# Patient Record
Sex: Female | Born: 1960
Health system: Southern US, Community
[De-identification: ages and names within clinical notes are randomized; demographics above are authoritative.]

## PROBLEM LIST (undated history)

## (undated) DIAGNOSIS — N952 Postmenopausal atrophic vaginitis: Secondary | ICD-10-CM

## (undated) DIAGNOSIS — M4306 Spondylolysis, lumbar region: Secondary | ICD-10-CM

## (undated) DIAGNOSIS — G500 Trigeminal neuralgia: Secondary | ICD-10-CM

## (undated) DIAGNOSIS — B009 Herpesviral infection, unspecified: Secondary | ICD-10-CM

## (undated) DIAGNOSIS — K912 Postsurgical malabsorption, not elsewhere classified: Secondary | ICD-10-CM

## (undated) DIAGNOSIS — I10 Essential (primary) hypertension: Secondary | ICD-10-CM

## (undated) DIAGNOSIS — J4520 Mild intermittent asthma, uncomplicated: Secondary | ICD-10-CM

## (undated) DIAGNOSIS — Z9989 Dependence on other enabling machines and devices: Secondary | ICD-10-CM

## (undated) DIAGNOSIS — Z8679 Personal history of other diseases of the circulatory system: Secondary | ICD-10-CM

## (undated) DIAGNOSIS — D509 Iron deficiency anemia, unspecified: Secondary | ICD-10-CM

## (undated) DIAGNOSIS — M5412 Radiculopathy, cervical region: Secondary | ICD-10-CM

## (undated) DIAGNOSIS — Z87410 Personal history of cervical dysplasia: Secondary | ICD-10-CM

## (undated) DIAGNOSIS — F329 Major depressive disorder, single episode, unspecified: Secondary | ICD-10-CM

## (undated) DIAGNOSIS — Z972 Presence of dental prosthetic device (complete) (partial): Secondary | ICD-10-CM

## (undated) DIAGNOSIS — Q6650 Congenital pes planus, unspecified foot: Secondary | ICD-10-CM

## (undated) DIAGNOSIS — Z8719 Personal history of other diseases of the digestive system: Secondary | ICD-10-CM

## (undated) DIAGNOSIS — Z9884 Bariatric surgery status: Secondary | ICD-10-CM

## (undated) DIAGNOSIS — J309 Allergic rhinitis, unspecified: Secondary | ICD-10-CM

## (undated) DIAGNOSIS — G4700 Insomnia, unspecified: Secondary | ICD-10-CM

## (undated) DIAGNOSIS — L811 Chloasma: Secondary | ICD-10-CM

## (undated) DIAGNOSIS — M79606 Pain in leg, unspecified: Secondary | ICD-10-CM

## (undated) DIAGNOSIS — H538 Other visual disturbances: Secondary | ICD-10-CM

## (undated) DIAGNOSIS — G43909 Migraine, unspecified, not intractable, without status migrainosus: Secondary | ICD-10-CM

## (undated) DIAGNOSIS — Z903 Acquired absence of stomach [part of]: Secondary | ICD-10-CM

## (undated) DIAGNOSIS — I872 Venous insufficiency (chronic) (peripheral): Secondary | ICD-10-CM

## (undated) DIAGNOSIS — M1990 Unspecified osteoarthritis, unspecified site: Secondary | ICD-10-CM

## (undated) DIAGNOSIS — G4733 Obstructive sleep apnea (adult) (pediatric): Secondary | ICD-10-CM

## (undated) DIAGNOSIS — Z973 Presence of spectacles and contact lenses: Secondary | ICD-10-CM

## (undated) DIAGNOSIS — M545 Low back pain: Secondary | ICD-10-CM

## (undated) DIAGNOSIS — N201 Calculus of ureter: Secondary | ICD-10-CM

## (undated) DIAGNOSIS — M12819 Other specific arthropathies, not elsewhere classified, unspecified shoulder: Secondary | ICD-10-CM

## (undated) DIAGNOSIS — R079 Chest pain, unspecified: Secondary | ICD-10-CM

## (undated) HISTORY — PX: ENDOMETRIAL ABLATION: SHX621

## (undated) HISTORY — DX: Essential (primary) hypertension: I10

## (undated) HISTORY — PX: LAPAROSCOPIC GASTRIC SLEEVE RESECTION: SHX5895

---
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
## 1990-12-29 HISTORY — PX: LAPAROSCOPIC CHOLECYSTECTOMY: SUR755

## 1998-11-08 ENCOUNTER — Encounter: Payer: Self-pay | Admitting: *Deleted

## 1998-11-08 ENCOUNTER — Emergency Department (HOSPITAL_COMMUNITY): Admission: EM | Admit: 1998-11-08 | Discharge: 1998-11-08 | Payer: Self-pay | Admitting: Emergency Medicine

## 1999-04-23 ENCOUNTER — Encounter: Admission: RE | Admit: 1999-04-23 | Discharge: 1999-04-23 | Payer: Self-pay | Admitting: Sports Medicine

## 1999-04-30 ENCOUNTER — Encounter: Admission: RE | Admit: 1999-04-30 | Discharge: 1999-04-30 | Payer: Self-pay | Admitting: Sports Medicine

## 1999-06-13 ENCOUNTER — Encounter: Admission: RE | Admit: 1999-06-13 | Discharge: 1999-06-13 | Payer: Self-pay | Admitting: Family Medicine

## 1999-06-21 ENCOUNTER — Encounter: Admission: RE | Admit: 1999-06-21 | Discharge: 1999-06-21 | Payer: Self-pay | Admitting: Family Medicine

## 1999-07-12 ENCOUNTER — Encounter: Admission: RE | Admit: 1999-07-12 | Discharge: 1999-07-12 | Payer: Self-pay | Admitting: Family Medicine

## 1999-09-09 ENCOUNTER — Encounter: Admission: RE | Admit: 1999-09-09 | Discharge: 1999-09-09 | Payer: Self-pay | Admitting: Family Medicine

## 1999-09-14 ENCOUNTER — Emergency Department (HOSPITAL_COMMUNITY): Admission: EM | Admit: 1999-09-14 | Discharge: 1999-09-14 | Payer: Self-pay | Admitting: Emergency Medicine

## 1999-09-27 ENCOUNTER — Encounter: Admission: RE | Admit: 1999-09-27 | Discharge: 1999-09-27 | Payer: Self-pay | Admitting: Family Medicine

## 1999-10-15 ENCOUNTER — Encounter: Admission: RE | Admit: 1999-10-15 | Discharge: 1999-10-15 | Payer: Self-pay | Admitting: Family Medicine

## 1999-10-23 ENCOUNTER — Encounter: Admission: RE | Admit: 1999-10-23 | Discharge: 1999-10-23 | Payer: Self-pay | Admitting: Family Medicine

## 1999-11-04 ENCOUNTER — Encounter: Admission: RE | Admit: 1999-11-04 | Discharge: 1999-11-04 | Payer: Self-pay | Admitting: Family Medicine

## 2000-04-21 HISTORY — PX: ANTERIOR CERVICAL DECOMP/DISCECTOMY FUSION: SHX1161

## 2000-08-26 ENCOUNTER — Encounter: Admission: RE | Admit: 2000-08-26 | Discharge: 2000-08-26 | Payer: Self-pay | Admitting: Family Medicine

## 2000-10-27 ENCOUNTER — Encounter: Admission: RE | Admit: 2000-10-27 | Discharge: 2000-10-27 | Payer: Self-pay | Admitting: Family Medicine

## 2000-11-02 ENCOUNTER — Encounter: Admission: RE | Admit: 2000-11-02 | Discharge: 2000-11-02 | Payer: Self-pay | Admitting: Family Medicine

## 2000-11-10 ENCOUNTER — Encounter: Admission: RE | Admit: 2000-11-10 | Discharge: 2001-02-08 | Payer: Self-pay | Admitting: *Deleted

## 2000-12-25 ENCOUNTER — Encounter: Admission: RE | Admit: 2000-12-25 | Discharge: 2000-12-25 | Payer: Self-pay | Admitting: Family Medicine

## 2001-02-24 ENCOUNTER — Encounter: Admission: RE | Admit: 2001-02-24 | Discharge: 2001-02-24 | Payer: Self-pay | Admitting: Family Medicine

## 2001-02-26 ENCOUNTER — Encounter: Admission: RE | Admit: 2001-02-26 | Discharge: 2001-02-26 | Payer: Self-pay | Admitting: Family Medicine

## 2001-03-25 ENCOUNTER — Encounter: Admission: RE | Admit: 2001-03-25 | Discharge: 2001-03-25 | Payer: Self-pay | Admitting: Family Medicine

## 2001-07-23 ENCOUNTER — Encounter: Admission: RE | Admit: 2001-07-23 | Discharge: 2001-07-23 | Payer: Self-pay | Admitting: Family Medicine

## 2001-10-11 ENCOUNTER — Emergency Department (HOSPITAL_COMMUNITY): Admission: EM | Admit: 2001-10-11 | Discharge: 2001-10-12 | Payer: Self-pay

## 2002-03-04 ENCOUNTER — Encounter: Admission: RE | Admit: 2002-03-04 | Discharge: 2002-03-04 | Payer: Self-pay | Admitting: Family Medicine

## 2002-03-16 ENCOUNTER — Encounter: Payer: Self-pay | Admitting: Emergency Medicine

## 2002-03-17 ENCOUNTER — Inpatient Hospital Stay (HOSPITAL_COMMUNITY): Admission: EM | Admit: 2002-03-17 | Discharge: 2002-03-20 | Payer: Self-pay | Admitting: *Deleted

## 2002-04-19 ENCOUNTER — Encounter: Admission: RE | Admit: 2002-04-19 | Discharge: 2002-04-19 | Payer: Self-pay | Admitting: Family Medicine

## 2002-05-03 ENCOUNTER — Emergency Department (HOSPITAL_COMMUNITY): Admission: EM | Admit: 2002-05-03 | Discharge: 2002-05-03 | Payer: Self-pay | Admitting: Emergency Medicine

## 2002-05-03 ENCOUNTER — Encounter: Payer: Self-pay | Admitting: Emergency Medicine

## 2002-05-19 ENCOUNTER — Encounter: Admission: RE | Admit: 2002-05-19 | Discharge: 2002-05-19 | Payer: Self-pay | Admitting: Family Medicine

## 2002-05-24 ENCOUNTER — Encounter: Admission: RE | Admit: 2002-05-24 | Discharge: 2002-05-24 | Payer: Self-pay | Admitting: Family Medicine

## 2002-05-26 ENCOUNTER — Encounter: Admission: RE | Admit: 2002-05-26 | Discharge: 2002-05-26 | Payer: Self-pay | Admitting: Family Medicine

## 2002-05-27 ENCOUNTER — Encounter: Admission: RE | Admit: 2002-05-27 | Discharge: 2002-08-25 | Payer: Self-pay | Admitting: Sports Medicine

## 2002-06-23 ENCOUNTER — Encounter: Admission: RE | Admit: 2002-06-23 | Discharge: 2002-06-23 | Payer: Self-pay | Admitting: Family Medicine

## 2002-07-27 ENCOUNTER — Encounter: Admission: RE | Admit: 2002-07-27 | Discharge: 2002-07-27 | Payer: Self-pay | Admitting: Family Medicine

## 2002-08-22 ENCOUNTER — Encounter: Admission: RE | Admit: 2002-08-22 | Discharge: 2002-08-22 | Payer: Self-pay | Admitting: Family Medicine

## 2002-11-03 ENCOUNTER — Encounter: Admission: RE | Admit: 2002-11-03 | Discharge: 2002-11-03 | Payer: Self-pay | Admitting: Family Medicine

## 2002-11-09 ENCOUNTER — Encounter: Payer: Self-pay | Admitting: Sports Medicine

## 2002-11-09 ENCOUNTER — Encounter: Admission: RE | Admit: 2002-11-09 | Discharge: 2002-11-09 | Payer: Self-pay | Admitting: Sports Medicine

## 2002-12-07 ENCOUNTER — Encounter: Admission: RE | Admit: 2002-12-07 | Discharge: 2002-12-07 | Payer: Self-pay | Admitting: Family Medicine

## 2002-12-19 ENCOUNTER — Encounter: Admission: RE | Admit: 2002-12-19 | Discharge: 2002-12-19 | Payer: Self-pay | Admitting: Family Medicine

## 2002-12-26 ENCOUNTER — Encounter: Payer: Self-pay | Admitting: *Deleted

## 2002-12-26 ENCOUNTER — Encounter: Admission: RE | Admit: 2002-12-26 | Discharge: 2002-12-26 | Payer: Self-pay | Admitting: *Deleted

## 2003-02-15 ENCOUNTER — Encounter: Admission: RE | Admit: 2003-02-15 | Discharge: 2003-02-15 | Payer: Self-pay | Admitting: Family Medicine

## 2003-02-15 ENCOUNTER — Ambulatory Visit (HOSPITAL_COMMUNITY): Admission: RE | Admit: 2003-02-15 | Discharge: 2003-02-15 | Payer: Self-pay | Admitting: Family Medicine

## 2003-02-20 ENCOUNTER — Encounter: Admission: RE | Admit: 2003-02-20 | Discharge: 2003-02-20 | Payer: Self-pay | Admitting: Family Medicine

## 2003-02-20 ENCOUNTER — Encounter: Payer: Self-pay | Admitting: Family Medicine

## 2003-02-23 ENCOUNTER — Encounter: Admission: RE | Admit: 2003-02-23 | Discharge: 2003-02-23 | Payer: Self-pay | Admitting: Family Medicine

## 2003-03-06 ENCOUNTER — Encounter: Admission: RE | Admit: 2003-03-06 | Discharge: 2003-03-06 | Payer: Self-pay | Admitting: Family Medicine

## 2003-03-06 ENCOUNTER — Ambulatory Visit (HOSPITAL_COMMUNITY): Admission: RE | Admit: 2003-03-06 | Discharge: 2003-03-06 | Payer: Self-pay | Admitting: Family Medicine

## 2003-04-24 ENCOUNTER — Encounter: Admission: RE | Admit: 2003-04-24 | Discharge: 2003-04-24 | Payer: Self-pay | Admitting: Family Medicine

## 2003-05-09 ENCOUNTER — Encounter: Admission: RE | Admit: 2003-05-09 | Discharge: 2003-05-09 | Payer: Self-pay | Admitting: Family Medicine

## 2003-06-21 ENCOUNTER — Emergency Department (HOSPITAL_COMMUNITY): Admission: EM | Admit: 2003-06-21 | Discharge: 2003-06-21 | Payer: Self-pay | Admitting: Emergency Medicine

## 2003-06-21 ENCOUNTER — Encounter: Payer: Self-pay | Admitting: Emergency Medicine

## 2003-07-31 ENCOUNTER — Encounter: Admission: RE | Admit: 2003-07-31 | Discharge: 2003-07-31 | Payer: Self-pay | Admitting: Family Medicine

## 2003-09-26 ENCOUNTER — Encounter: Admission: RE | Admit: 2003-09-26 | Discharge: 2003-09-26 | Payer: Self-pay | Admitting: Family Medicine

## 2003-09-28 ENCOUNTER — Encounter: Payer: Self-pay | Admitting: Sports Medicine

## 2003-09-28 ENCOUNTER — Encounter: Admission: RE | Admit: 2003-09-28 | Discharge: 2003-09-28 | Payer: Self-pay | Admitting: Sports Medicine

## 2003-10-11 ENCOUNTER — Emergency Department (HOSPITAL_COMMUNITY): Admission: EM | Admit: 2003-10-11 | Discharge: 2003-10-11 | Payer: Self-pay | Admitting: Emergency Medicine

## 2003-10-11 ENCOUNTER — Encounter: Payer: Self-pay | Admitting: Emergency Medicine

## 2003-11-15 ENCOUNTER — Encounter: Admission: RE | Admit: 2003-11-15 | Discharge: 2003-11-15 | Payer: Self-pay | Admitting: Family Medicine

## 2003-12-01 ENCOUNTER — Encounter: Admission: RE | Admit: 2003-12-01 | Discharge: 2003-12-01 | Payer: Self-pay | Admitting: Family Medicine

## 2004-01-19 ENCOUNTER — Encounter: Admission: RE | Admit: 2004-01-19 | Discharge: 2004-01-19 | Payer: Self-pay | Admitting: Family Medicine

## 2004-01-22 ENCOUNTER — Encounter: Admission: RE | Admit: 2004-01-22 | Discharge: 2004-01-22 | Payer: Self-pay | Admitting: Family Medicine

## 2004-03-07 ENCOUNTER — Emergency Department (HOSPITAL_COMMUNITY): Admission: EM | Admit: 2004-03-07 | Discharge: 2004-03-07 | Payer: Self-pay | Admitting: Emergency Medicine

## 2004-03-26 ENCOUNTER — Encounter: Admission: RE | Admit: 2004-03-26 | Discharge: 2004-03-26 | Payer: Self-pay | Admitting: *Deleted

## 2004-04-03 ENCOUNTER — Encounter: Admission: RE | Admit: 2004-04-03 | Discharge: 2004-07-02 | Payer: Self-pay | Admitting: *Deleted

## 2004-04-30 ENCOUNTER — Encounter: Admission: RE | Admit: 2004-04-30 | Discharge: 2004-04-30 | Payer: Self-pay | Admitting: Family Medicine

## 2004-08-13 ENCOUNTER — Encounter: Admission: RE | Admit: 2004-08-13 | Discharge: 2004-08-13 | Payer: Self-pay | Admitting: Family Medicine

## 2004-08-27 ENCOUNTER — Encounter: Admission: RE | Admit: 2004-08-27 | Discharge: 2004-08-27 | Payer: Self-pay | Admitting: Family Medicine

## 2004-09-29 ENCOUNTER — Emergency Department (HOSPITAL_COMMUNITY): Admission: EM | Admit: 2004-09-29 | Discharge: 2004-09-29 | Payer: Self-pay | Admitting: Emergency Medicine

## 2004-12-18 ENCOUNTER — Emergency Department (HOSPITAL_COMMUNITY): Admission: EM | Admit: 2004-12-18 | Discharge: 2004-12-19 | Payer: Self-pay | Admitting: Emergency Medicine

## 2004-12-27 ENCOUNTER — Ambulatory Visit: Payer: Self-pay | Admitting: Family Medicine

## 2005-01-17 ENCOUNTER — Ambulatory Visit: Payer: Self-pay | Admitting: Sports Medicine

## 2005-01-17 ENCOUNTER — Encounter (INDEPENDENT_AMBULATORY_CARE_PROVIDER_SITE_OTHER): Payer: Self-pay | Admitting: *Deleted

## 2005-01-20 ENCOUNTER — Ambulatory Visit: Payer: Self-pay | Admitting: Sports Medicine

## 2005-03-18 ENCOUNTER — Emergency Department (HOSPITAL_COMMUNITY): Admission: EM | Admit: 2005-03-18 | Discharge: 2005-03-19 | Payer: Self-pay | Admitting: Emergency Medicine

## 2005-03-19 ENCOUNTER — Emergency Department (HOSPITAL_COMMUNITY): Admission: EM | Admit: 2005-03-19 | Discharge: 2005-03-19 | Payer: Self-pay | Admitting: Emergency Medicine

## 2005-04-09 ENCOUNTER — Ambulatory Visit: Payer: Self-pay | Admitting: Family Medicine

## 2005-04-14 ENCOUNTER — Ambulatory Visit: Payer: Self-pay | Admitting: Family Medicine

## 2005-06-24 ENCOUNTER — Ambulatory Visit: Payer: Self-pay | Admitting: Family Medicine

## 2005-08-14 ENCOUNTER — Ambulatory Visit: Payer: Self-pay | Admitting: Family Medicine

## 2005-08-25 ENCOUNTER — Ambulatory Visit: Payer: Self-pay | Admitting: Family Medicine

## 2005-09-10 ENCOUNTER — Ambulatory Visit: Payer: Self-pay | Admitting: Family Medicine

## 2005-10-15 ENCOUNTER — Emergency Department (HOSPITAL_COMMUNITY): Admission: EM | Admit: 2005-10-15 | Discharge: 2005-10-15 | Payer: Self-pay | Admitting: Emergency Medicine

## 2005-10-31 ENCOUNTER — Ambulatory Visit: Payer: Self-pay | Admitting: Family Medicine

## 2005-12-09 ENCOUNTER — Ambulatory Visit: Payer: Self-pay | Admitting: Family Medicine

## 2006-01-14 ENCOUNTER — Ambulatory Visit: Payer: Self-pay | Admitting: Family Medicine

## 2006-01-30 ENCOUNTER — Ambulatory Visit: Payer: Self-pay | Admitting: Family Medicine

## 2006-02-02 ENCOUNTER — Ambulatory Visit: Payer: Self-pay | Admitting: Family Medicine

## 2006-03-18 ENCOUNTER — Ambulatory Visit: Payer: Self-pay | Admitting: Family Medicine

## 2006-04-28 ENCOUNTER — Encounter (INDEPENDENT_AMBULATORY_CARE_PROVIDER_SITE_OTHER): Payer: Self-pay | Admitting: *Deleted

## 2006-04-28 LAB — CONVERTED CEMR LAB

## 2006-05-08 ENCOUNTER — Ambulatory Visit: Payer: Self-pay | Admitting: Family Medicine

## 2006-05-15 ENCOUNTER — Ambulatory Visit: Payer: Self-pay | Admitting: Family Medicine

## 2006-06-04 ENCOUNTER — Encounter: Admission: RE | Admit: 2006-06-04 | Discharge: 2006-06-04 | Payer: Self-pay | Admitting: Sports Medicine

## 2006-06-12 ENCOUNTER — Ambulatory Visit: Payer: Self-pay | Admitting: Family Medicine

## 2006-07-16 ENCOUNTER — Encounter: Admission: RE | Admit: 2006-07-16 | Discharge: 2006-07-16 | Payer: Self-pay | Admitting: Sports Medicine

## 2006-09-18 ENCOUNTER — Ambulatory Visit: Payer: Self-pay | Admitting: Family Medicine

## 2006-10-05 ENCOUNTER — Ambulatory Visit: Payer: Self-pay | Admitting: Family Medicine

## 2006-10-27 ENCOUNTER — Ambulatory Visit: Payer: Self-pay | Admitting: Sports Medicine

## 2006-12-09 ENCOUNTER — Ambulatory Visit: Payer: Self-pay | Admitting: Family Medicine

## 2007-01-07 ENCOUNTER — Ambulatory Visit: Payer: Self-pay | Admitting: Family Medicine

## 2007-01-27 ENCOUNTER — Ambulatory Visit: Payer: Self-pay | Admitting: Family Medicine

## 2007-01-27 ENCOUNTER — Encounter (INDEPENDENT_AMBULATORY_CARE_PROVIDER_SITE_OTHER): Payer: Self-pay | Admitting: Family Medicine

## 2007-01-27 LAB — CONVERTED CEMR LAB
CO2: 25 meq/L (ref 19–32)
Calcium: 9.2 mg/dL (ref 8.4–10.5)
Chloride: 102 meq/L (ref 96–112)
Creatinine, Ser: 0.62 mg/dL (ref 0.40–1.20)
Glucose, Bld: 83 mg/dL (ref 70–99)
TSH: 1.352 microintl units/mL (ref 0.350–5.50)

## 2007-02-01 ENCOUNTER — Ambulatory Visit: Payer: Self-pay | Admitting: Family Medicine

## 2007-02-02 ENCOUNTER — Emergency Department (HOSPITAL_COMMUNITY): Admission: EM | Admit: 2007-02-02 | Discharge: 2007-02-02 | Payer: Self-pay | Admitting: Emergency Medicine

## 2007-02-05 ENCOUNTER — Ambulatory Visit: Payer: Self-pay | Admitting: Family Medicine

## 2007-02-16 ENCOUNTER — Ambulatory Visit: Payer: Self-pay | Admitting: Family Medicine

## 2007-02-17 DIAGNOSIS — M719 Bursopathy, unspecified: Secondary | ICD-10-CM

## 2007-02-17 DIAGNOSIS — M67919 Unspecified disorder of synovium and tendon, unspecified shoulder: Secondary | ICD-10-CM | POA: Insufficient documentation

## 2007-02-18 ENCOUNTER — Ambulatory Visit: Payer: Self-pay | Admitting: Family Medicine

## 2007-02-26 ENCOUNTER — Encounter (INDEPENDENT_AMBULATORY_CARE_PROVIDER_SITE_OTHER): Payer: Self-pay | Admitting: *Deleted

## 2007-03-03 ENCOUNTER — Telehealth: Payer: Self-pay | Admitting: *Deleted

## 2007-03-05 ENCOUNTER — Ambulatory Visit: Payer: Self-pay | Admitting: Sports Medicine

## 2007-03-25 ENCOUNTER — Encounter (INDEPENDENT_AMBULATORY_CARE_PROVIDER_SITE_OTHER): Payer: Self-pay | Admitting: Family Medicine

## 2007-03-25 ENCOUNTER — Ambulatory Visit (HOSPITAL_COMMUNITY): Admission: RE | Admit: 2007-03-25 | Discharge: 2007-03-25 | Payer: Self-pay | Admitting: Family Medicine

## 2007-03-25 ENCOUNTER — Ambulatory Visit: Payer: Self-pay | Admitting: Sports Medicine

## 2007-03-25 DIAGNOSIS — R079 Chest pain, unspecified: Secondary | ICD-10-CM

## 2007-03-25 HISTORY — DX: Chest pain, unspecified: R07.9

## 2007-03-26 ENCOUNTER — Encounter (INDEPENDENT_AMBULATORY_CARE_PROVIDER_SITE_OTHER): Payer: Self-pay | Admitting: Family Medicine

## 2007-03-26 LAB — CONVERTED CEMR LAB
AST: 12 units/L (ref 0–37)
Albumin: 3.5 g/dL (ref 3.5–5.2)
Calcium: 8.5 mg/dL (ref 8.4–10.5)
MCV: 92.2 fL (ref 78.0–100.0)
Pro B Natriuretic peptide (BNP): 24 pg/mL (ref 0.0–100.0)
Sodium: 139 meq/L (ref 135–145)
Total Bilirubin: 0.3 mg/dL (ref 0.3–1.2)
WBC: 8.3 10*3/uL (ref 4.0–10.5)

## 2007-03-29 ENCOUNTER — Encounter (INDEPENDENT_AMBULATORY_CARE_PROVIDER_SITE_OTHER): Payer: Self-pay | Admitting: Family Medicine

## 2007-04-06 ENCOUNTER — Encounter (INDEPENDENT_AMBULATORY_CARE_PROVIDER_SITE_OTHER): Payer: Self-pay | Admitting: Cardiology

## 2007-04-06 ENCOUNTER — Ambulatory Visit (HOSPITAL_COMMUNITY): Admission: RE | Admit: 2007-04-06 | Discharge: 2007-04-06 | Payer: Self-pay | Admitting: Family Medicine

## 2007-04-06 ENCOUNTER — Ambulatory Visit: Payer: Self-pay | Admitting: Cardiology

## 2007-04-06 HISTORY — PX: TRANSTHORACIC ECHOCARDIOGRAM: SHX275

## 2007-04-16 ENCOUNTER — Ambulatory Visit: Payer: Self-pay

## 2007-04-22 ENCOUNTER — Telehealth: Payer: Self-pay | Admitting: *Deleted

## 2007-04-23 ENCOUNTER — Ambulatory Visit: Payer: Self-pay

## 2007-04-30 ENCOUNTER — Ambulatory Visit: Payer: Self-pay | Admitting: Family Medicine

## 2007-04-30 DIAGNOSIS — N760 Acute vaginitis: Secondary | ICD-10-CM | POA: Insufficient documentation

## 2007-04-30 LAB — CONVERTED CEMR LAB: Whiff Test: POSITIVE

## 2007-05-04 ENCOUNTER — Telehealth (INDEPENDENT_AMBULATORY_CARE_PROVIDER_SITE_OTHER): Payer: Self-pay | Admitting: *Deleted

## 2007-05-04 ENCOUNTER — Ambulatory Visit (HOSPITAL_BASED_OUTPATIENT_CLINIC_OR_DEPARTMENT_OTHER): Admission: RE | Admit: 2007-05-04 | Discharge: 2007-05-04 | Payer: Self-pay | Admitting: Family Medicine

## 2007-05-08 ENCOUNTER — Ambulatory Visit: Payer: Self-pay | Admitting: Internal Medicine

## 2007-05-21 ENCOUNTER — Encounter (INDEPENDENT_AMBULATORY_CARE_PROVIDER_SITE_OTHER): Payer: Self-pay | Admitting: Family Medicine

## 2007-06-04 ENCOUNTER — Ambulatory Visit: Payer: Self-pay | Admitting: Family Medicine

## 2007-06-04 DIAGNOSIS — F32A Depression, unspecified: Secondary | ICD-10-CM | POA: Insufficient documentation

## 2007-06-04 DIAGNOSIS — F329 Major depressive disorder, single episode, unspecified: Secondary | ICD-10-CM

## 2007-06-07 ENCOUNTER — Telehealth: Payer: Self-pay | Admitting: *Deleted

## 2007-06-08 ENCOUNTER — Telehealth (INDEPENDENT_AMBULATORY_CARE_PROVIDER_SITE_OTHER): Payer: Self-pay | Admitting: Family Medicine

## 2007-06-09 ENCOUNTER — Telehealth: Payer: Self-pay | Admitting: *Deleted

## 2007-06-10 ENCOUNTER — Telehealth: Payer: Self-pay | Admitting: *Deleted

## 2007-06-18 ENCOUNTER — Ambulatory Visit: Payer: Self-pay | Admitting: Family Medicine

## 2007-08-23 ENCOUNTER — Ambulatory Visit: Payer: Self-pay | Admitting: Family Medicine

## 2007-08-23 ENCOUNTER — Telehealth (INDEPENDENT_AMBULATORY_CARE_PROVIDER_SITE_OTHER): Payer: Self-pay | Admitting: *Deleted

## 2007-08-23 DIAGNOSIS — G43909 Migraine, unspecified, not intractable, without status migrainosus: Secondary | ICD-10-CM | POA: Insufficient documentation

## 2007-08-23 HISTORY — DX: Migraine, unspecified, not intractable, without status migrainosus: G43.909

## 2007-09-14 ENCOUNTER — Telehealth: Payer: Self-pay | Admitting: *Deleted

## 2007-09-17 ENCOUNTER — Ambulatory Visit: Payer: Self-pay | Admitting: Family Medicine

## 2007-09-17 ENCOUNTER — Encounter (INDEPENDENT_AMBULATORY_CARE_PROVIDER_SITE_OTHER): Payer: Self-pay | Admitting: Family Medicine

## 2007-09-17 LAB — CONVERTED CEMR LAB
Beta hcg, urine, semiquantitative: NEGATIVE
Chlamydia, DNA Probe: NEGATIVE
Glucose, Urine, Semiquant: NEGATIVE
KOH Prep: NEGATIVE
Nitrite: NEGATIVE
Protein, U semiquant: NEGATIVE
Specific Gravity, Urine: 1.025
Urobilinogen, UA: 0.2
pH: 6

## 2007-09-18 ENCOUNTER — Encounter (INDEPENDENT_AMBULATORY_CARE_PROVIDER_SITE_OTHER): Payer: Self-pay | Admitting: Family Medicine

## 2007-09-22 ENCOUNTER — Encounter (INDEPENDENT_AMBULATORY_CARE_PROVIDER_SITE_OTHER): Payer: Self-pay | Admitting: Family Medicine

## 2007-09-22 ENCOUNTER — Encounter: Admission: RE | Admit: 2007-09-22 | Discharge: 2007-09-22 | Payer: Self-pay | Admitting: Family Medicine

## 2007-09-23 ENCOUNTER — Telehealth: Payer: Self-pay | Admitting: *Deleted

## 2007-10-05 ENCOUNTER — Telehealth (INDEPENDENT_AMBULATORY_CARE_PROVIDER_SITE_OTHER): Payer: Self-pay | Admitting: Family Medicine

## 2007-11-18 ENCOUNTER — Ambulatory Visit: Payer: Self-pay | Admitting: Family Medicine

## 2007-11-18 ENCOUNTER — Encounter (INDEPENDENT_AMBULATORY_CARE_PROVIDER_SITE_OTHER): Payer: Self-pay | Admitting: Family Medicine

## 2007-11-19 ENCOUNTER — Telehealth (INDEPENDENT_AMBULATORY_CARE_PROVIDER_SITE_OTHER): Payer: Self-pay | Admitting: Family Medicine

## 2007-11-24 ENCOUNTER — Ambulatory Visit: Payer: Self-pay | Admitting: Sports Medicine

## 2007-11-24 DIAGNOSIS — M12819 Other specific arthropathies, not elsewhere classified, unspecified shoulder: Secondary | ICD-10-CM

## 2007-11-24 DIAGNOSIS — M7520 Bicipital tendinitis, unspecified shoulder: Secondary | ICD-10-CM | POA: Insufficient documentation

## 2007-11-24 DIAGNOSIS — S43429A Sprain of unspecified rotator cuff capsule, initial encounter: Secondary | ICD-10-CM

## 2007-11-24 HISTORY — DX: Other specific arthropathies, not elsewhere classified, unspecified shoulder: M12.819

## 2008-01-10 ENCOUNTER — Ambulatory Visit: Payer: Self-pay | Admitting: Sports Medicine

## 2008-01-24 ENCOUNTER — Ambulatory Visit: Payer: Self-pay | Admitting: Family Medicine

## 2008-01-24 DIAGNOSIS — G4700 Insomnia, unspecified: Secondary | ICD-10-CM

## 2008-01-26 ENCOUNTER — Telehealth (INDEPENDENT_AMBULATORY_CARE_PROVIDER_SITE_OTHER): Payer: Self-pay | Admitting: Family Medicine

## 2008-01-31 ENCOUNTER — Encounter (INDEPENDENT_AMBULATORY_CARE_PROVIDER_SITE_OTHER): Payer: Self-pay | Admitting: Family Medicine

## 2008-02-04 ENCOUNTER — Telehealth (INDEPENDENT_AMBULATORY_CARE_PROVIDER_SITE_OTHER): Payer: Self-pay | Admitting: Family Medicine

## 2008-02-15 ENCOUNTER — Ambulatory Visit: Payer: Self-pay | Admitting: Family Medicine

## 2008-02-15 ENCOUNTER — Telehealth: Payer: Self-pay | Admitting: *Deleted

## 2008-02-23 ENCOUNTER — Ambulatory Visit: Payer: Self-pay | Admitting: Family Medicine

## 2008-02-25 ENCOUNTER — Ambulatory Visit: Payer: Self-pay | Admitting: Family Medicine

## 2008-03-03 ENCOUNTER — Ambulatory Visit: Payer: Self-pay | Admitting: Family Medicine

## 2008-03-03 DIAGNOSIS — M19079 Primary osteoarthritis, unspecified ankle and foot: Secondary | ICD-10-CM

## 2008-03-07 ENCOUNTER — Encounter: Admission: RE | Admit: 2008-03-07 | Discharge: 2008-03-07 | Payer: Self-pay | Admitting: Family Medicine

## 2008-03-08 ENCOUNTER — Telehealth (INDEPENDENT_AMBULATORY_CARE_PROVIDER_SITE_OTHER): Payer: Self-pay | Admitting: Family Medicine

## 2008-03-16 ENCOUNTER — Telehealth: Payer: Self-pay | Admitting: *Deleted

## 2008-03-17 ENCOUNTER — Ambulatory Visit: Payer: Self-pay | Admitting: Sports Medicine

## 2008-03-17 LAB — CONVERTED CEMR LAB
Saturation Ratios: 16 % — ABNORMAL LOW (ref 20–55)
TIBC: 261 ug/dL (ref 250–470)
UIBC: 220 ug/dL

## 2008-03-20 ENCOUNTER — Encounter (INDEPENDENT_AMBULATORY_CARE_PROVIDER_SITE_OTHER): Payer: Self-pay | Admitting: Family Medicine

## 2008-03-21 ENCOUNTER — Telehealth: Payer: Self-pay | Admitting: *Deleted

## 2008-03-24 ENCOUNTER — Ambulatory Visit: Payer: Self-pay | Admitting: Family Medicine

## 2008-03-24 DIAGNOSIS — G4733 Obstructive sleep apnea (adult) (pediatric): Secondary | ICD-10-CM | POA: Insufficient documentation

## 2008-03-26 ENCOUNTER — Emergency Department (HOSPITAL_COMMUNITY): Admission: EM | Admit: 2008-03-26 | Discharge: 2008-03-26 | Payer: Self-pay | Admitting: Emergency Medicine

## 2008-03-31 ENCOUNTER — Encounter (INDEPENDENT_AMBULATORY_CARE_PROVIDER_SITE_OTHER): Payer: Self-pay | Admitting: Family Medicine

## 2008-03-31 ENCOUNTER — Ambulatory Visit: Payer: Self-pay | Admitting: Family Medicine

## 2008-03-31 LAB — CONVERTED CEMR LAB
Alkaline Phosphatase: 89 units/L (ref 39–117)
CO2: 20 meq/L (ref 19–32)
Calcium: 8.7 mg/dL (ref 8.4–10.5)
Chloride: 103 meq/L (ref 96–112)
Cholesterol: 159 mg/dL (ref 0–200)
Glucose, Bld: 95 mg/dL (ref 70–99)
HCT: 37.2 % (ref 36.0–46.0)
Hgb A1c MFr Bld: 6 %
LDL Cholesterol: 94 mg/dL (ref 0–99)
Lead-Whole Blood: 2.1 ug/dL (ref <10.0)
MCHC: 33.3 g/dL (ref 30.0–36.0)
RBC: 4.03 M/uL (ref 3.87–5.11)
Total Bilirubin: 0.3 mg/dL (ref 0.3–1.2)
Total CHOL/HDL Ratio: 3
Triglycerides: 60 mg/dL (ref <150)
VLDL: 12 mg/dL (ref 0–40)

## 2008-04-02 ENCOUNTER — Encounter (INDEPENDENT_AMBULATORY_CARE_PROVIDER_SITE_OTHER): Payer: Self-pay | Admitting: Family Medicine

## 2008-05-12 ENCOUNTER — Encounter (INDEPENDENT_AMBULATORY_CARE_PROVIDER_SITE_OTHER): Payer: Self-pay | Admitting: Family Medicine

## 2008-05-12 ENCOUNTER — Other Ambulatory Visit: Admission: RE | Admit: 2008-05-12 | Discharge: 2008-05-12 | Payer: Self-pay | Admitting: Family Medicine

## 2008-05-12 ENCOUNTER — Ambulatory Visit: Payer: Self-pay | Admitting: Family Medicine

## 2008-05-12 DIAGNOSIS — N926 Irregular menstruation, unspecified: Secondary | ICD-10-CM | POA: Insufficient documentation

## 2008-05-12 DIAGNOSIS — N939 Abnormal uterine and vaginal bleeding, unspecified: Secondary | ICD-10-CM

## 2008-05-12 LAB — CONVERTED CEMR LAB
Chlamydia, DNA Probe: NEGATIVE
Pap Smear: NORMAL
Pap Smear: NORMAL
Pap Smear: NORMAL

## 2008-05-13 ENCOUNTER — Encounter (INDEPENDENT_AMBULATORY_CARE_PROVIDER_SITE_OTHER): Payer: Self-pay | Admitting: Family Medicine

## 2008-05-17 ENCOUNTER — Telehealth (INDEPENDENT_AMBULATORY_CARE_PROVIDER_SITE_OTHER): Payer: Self-pay | Admitting: Family Medicine

## 2008-06-05 ENCOUNTER — Telehealth (INDEPENDENT_AMBULATORY_CARE_PROVIDER_SITE_OTHER): Payer: Self-pay | Admitting: Family Medicine

## 2008-06-15 ENCOUNTER — Telehealth (INDEPENDENT_AMBULATORY_CARE_PROVIDER_SITE_OTHER): Payer: Self-pay | Admitting: *Deleted

## 2008-06-18 ENCOUNTER — Encounter (INDEPENDENT_AMBULATORY_CARE_PROVIDER_SITE_OTHER): Payer: Self-pay | Admitting: Emergency Medicine

## 2008-06-18 ENCOUNTER — Ambulatory Visit: Payer: Self-pay | Admitting: Vascular Surgery

## 2008-06-18 ENCOUNTER — Emergency Department (HOSPITAL_COMMUNITY): Admission: EM | Admit: 2008-06-18 | Discharge: 2008-06-18 | Payer: Self-pay | Admitting: Emergency Medicine

## 2008-06-20 ENCOUNTER — Telehealth (INDEPENDENT_AMBULATORY_CARE_PROVIDER_SITE_OTHER): Payer: Self-pay | Admitting: *Deleted

## 2008-06-23 ENCOUNTER — Encounter: Admission: RE | Admit: 2008-06-23 | Discharge: 2008-06-23 | Payer: Self-pay | Admitting: Family Medicine

## 2008-07-01 ENCOUNTER — Emergency Department (HOSPITAL_COMMUNITY): Admission: EM | Admit: 2008-07-01 | Discharge: 2008-07-01 | Payer: Self-pay | Admitting: Emergency Medicine

## 2008-07-07 ENCOUNTER — Ambulatory Visit: Payer: Self-pay | Admitting: Family Medicine

## 2008-07-07 DIAGNOSIS — I872 Venous insufficiency (chronic) (peripheral): Secondary | ICD-10-CM

## 2008-07-07 HISTORY — DX: Venous insufficiency (chronic) (peripheral): I87.2

## 2008-07-25 ENCOUNTER — Telehealth: Payer: Self-pay | Admitting: *Deleted

## 2008-07-25 ENCOUNTER — Ambulatory Visit: Payer: Self-pay | Admitting: Family Medicine

## 2008-07-26 ENCOUNTER — Emergency Department (HOSPITAL_COMMUNITY): Admission: EM | Admit: 2008-07-26 | Discharge: 2008-07-27 | Payer: Self-pay | Admitting: Emergency Medicine

## 2008-08-08 ENCOUNTER — Encounter: Payer: Self-pay | Admitting: Family Medicine

## 2008-09-18 ENCOUNTER — Encounter: Payer: Self-pay | Admitting: Family Medicine

## 2008-09-20 ENCOUNTER — Telehealth: Payer: Self-pay | Admitting: *Deleted

## 2008-09-27 ENCOUNTER — Ambulatory Visit: Payer: Self-pay | Admitting: Family Medicine

## 2008-09-27 DIAGNOSIS — I10 Essential (primary) hypertension: Secondary | ICD-10-CM

## 2008-09-27 DIAGNOSIS — M549 Dorsalgia, unspecified: Secondary | ICD-10-CM | POA: Insufficient documentation

## 2008-09-27 HISTORY — DX: Essential (primary) hypertension: I10

## 2008-09-27 LAB — CONVERTED CEMR LAB
Bilirubin Urine: NEGATIVE
Blood in Urine, dipstick: NEGATIVE
Glucose, Urine, Semiquant: NEGATIVE
Ketones, urine, test strip: NEGATIVE
Nitrite: NEGATIVE
Protein, U semiquant: NEGATIVE
Specific Gravity, Urine: 1.015
Urobilinogen, UA: 0.2
WBC Urine, dipstick: NEGATIVE
pH: 7

## 2008-09-29 ENCOUNTER — Emergency Department (HOSPITAL_COMMUNITY): Admission: EM | Admit: 2008-09-29 | Discharge: 2008-09-30 | Payer: Self-pay | Admitting: Emergency Medicine

## 2008-10-13 ENCOUNTER — Telehealth (INDEPENDENT_AMBULATORY_CARE_PROVIDER_SITE_OTHER): Payer: Self-pay | Admitting: *Deleted

## 2008-10-16 ENCOUNTER — Ambulatory Visit: Payer: Self-pay | Admitting: Family Medicine

## 2008-10-16 LAB — CONVERTED CEMR LAB
Bilirubin Urine: NEGATIVE
Blood in Urine, dipstick: NEGATIVE
Ketones, urine, test strip: NEGATIVE

## 2008-10-26 ENCOUNTER — Encounter: Payer: Self-pay | Admitting: *Deleted

## 2008-10-26 ENCOUNTER — Emergency Department (HOSPITAL_COMMUNITY): Admission: EM | Admit: 2008-10-26 | Discharge: 2008-10-26 | Payer: Self-pay | Admitting: Emergency Medicine

## 2008-10-27 ENCOUNTER — Encounter: Payer: Self-pay | Admitting: *Deleted

## 2008-12-09 ENCOUNTER — Ambulatory Visit: Payer: Self-pay | Admitting: Vascular Surgery

## 2008-12-09 ENCOUNTER — Encounter (INDEPENDENT_AMBULATORY_CARE_PROVIDER_SITE_OTHER): Payer: Self-pay | Admitting: Emergency Medicine

## 2008-12-09 ENCOUNTER — Emergency Department (HOSPITAL_COMMUNITY): Admission: EM | Admit: 2008-12-09 | Discharge: 2008-12-09 | Payer: Self-pay | Admitting: Emergency Medicine

## 2008-12-26 ENCOUNTER — Telehealth: Payer: Self-pay | Admitting: *Deleted

## 2008-12-27 ENCOUNTER — Ambulatory Visit: Payer: Self-pay | Admitting: Family Medicine

## 2009-01-31 ENCOUNTER — Emergency Department (HOSPITAL_COMMUNITY): Admission: EM | Admit: 2009-01-31 | Discharge: 2009-01-31 | Payer: Self-pay | Admitting: Emergency Medicine

## 2009-01-31 ENCOUNTER — Telehealth: Payer: Self-pay | Admitting: Family Medicine

## 2009-02-01 ENCOUNTER — Encounter (INDEPENDENT_AMBULATORY_CARE_PROVIDER_SITE_OTHER): Payer: Self-pay | Admitting: Emergency Medicine

## 2009-02-01 ENCOUNTER — Ambulatory Visit (HOSPITAL_COMMUNITY): Admission: RE | Admit: 2009-02-01 | Discharge: 2009-02-01 | Payer: Self-pay | Admitting: Emergency Medicine

## 2009-02-01 ENCOUNTER — Ambulatory Visit: Payer: Self-pay | Admitting: Surgery

## 2009-02-06 ENCOUNTER — Ambulatory Visit: Payer: Self-pay | Admitting: Family Medicine

## 2009-02-06 DIAGNOSIS — R609 Edema, unspecified: Secondary | ICD-10-CM

## 2009-02-06 DIAGNOSIS — M23303 Other meniscus derangements, unspecified medial meniscus, right knee: Secondary | ICD-10-CM

## 2009-02-12 ENCOUNTER — Encounter: Admission: RE | Admit: 2009-02-12 | Discharge: 2009-02-12 | Payer: Self-pay | Admitting: Family Medicine

## 2009-02-16 ENCOUNTER — Ambulatory Visit: Payer: Self-pay | Admitting: Family Medicine

## 2009-02-19 ENCOUNTER — Ambulatory Visit: Payer: Self-pay | Admitting: Family Medicine

## 2009-03-19 ENCOUNTER — Emergency Department (HOSPITAL_COMMUNITY): Admission: EM | Admit: 2009-03-19 | Discharge: 2009-03-19 | Payer: Self-pay | Admitting: Emergency Medicine

## 2009-03-19 ENCOUNTER — Telehealth: Payer: Self-pay | Admitting: *Deleted

## 2009-03-27 ENCOUNTER — Ambulatory Visit: Payer: Self-pay | Admitting: Family Medicine

## 2009-03-27 LAB — CONVERTED CEMR LAB
Chlamydia, DNA Probe: NEGATIVE
GC Probe Amp, Genital: NEGATIVE
Whiff Test: NEGATIVE

## 2009-03-28 ENCOUNTER — Encounter (INDEPENDENT_AMBULATORY_CARE_PROVIDER_SITE_OTHER): Payer: Self-pay | Admitting: *Deleted

## 2009-04-02 ENCOUNTER — Encounter: Payer: Self-pay | Admitting: Family Medicine

## 2009-04-02 ENCOUNTER — Telehealth: Payer: Self-pay | Admitting: *Deleted

## 2009-05-25 ENCOUNTER — Telehealth: Payer: Self-pay | Admitting: Family Medicine

## 2009-05-29 ENCOUNTER — Emergency Department (HOSPITAL_COMMUNITY): Admission: EM | Admit: 2009-05-29 | Discharge: 2009-05-29 | Payer: Self-pay | Admitting: Emergency Medicine

## 2009-05-31 ENCOUNTER — Telehealth: Payer: Self-pay | Admitting: *Deleted

## 2009-06-05 ENCOUNTER — Encounter: Payer: Self-pay | Admitting: Family Medicine

## 2009-06-25 ENCOUNTER — Encounter: Admission: RE | Admit: 2009-06-25 | Discharge: 2009-06-25 | Payer: Self-pay | Admitting: Family Medicine

## 2009-07-03 ENCOUNTER — Encounter: Payer: Self-pay | Admitting: Family Medicine

## 2009-07-24 ENCOUNTER — Ambulatory Visit: Payer: Self-pay | Admitting: Family Medicine

## 2009-07-24 ENCOUNTER — Encounter: Payer: Self-pay | Admitting: Family Medicine

## 2009-07-24 DIAGNOSIS — L659 Nonscarring hair loss, unspecified: Secondary | ICD-10-CM | POA: Insufficient documentation

## 2009-07-24 DIAGNOSIS — F070 Personality change due to known physiological condition: Secondary | ICD-10-CM

## 2009-07-24 LAB — CONVERTED CEMR LAB
BUN: 11 mg/dL (ref 6–23)
CO2: 21 meq/L (ref 19–32)
Calcium: 8.7 mg/dL (ref 8.4–10.5)
Chloride: 106 meq/L (ref 96–112)
Creatinine, Ser: 0.68 mg/dL (ref 0.40–1.20)
Glucose, Bld: 94 mg/dL (ref 70–99)
Potassium: 3.6 meq/L (ref 3.5–5.3)
Sodium: 139 meq/L (ref 135–145)
TSH: 1.504 microintl units/mL (ref 0.350–4.500)

## 2009-07-25 ENCOUNTER — Encounter: Payer: Self-pay | Admitting: Family Medicine

## 2009-08-02 ENCOUNTER — Encounter: Payer: Self-pay | Admitting: Family Medicine

## 2009-08-02 ENCOUNTER — Encounter: Admission: RE | Admit: 2009-08-02 | Discharge: 2009-10-31 | Payer: Self-pay | Admitting: Family Medicine

## 2009-09-27 ENCOUNTER — Telehealth: Payer: Self-pay | Admitting: Family Medicine

## 2009-10-10 ENCOUNTER — Emergency Department (HOSPITAL_COMMUNITY): Admission: EM | Admit: 2009-10-10 | Discharge: 2009-10-11 | Payer: Self-pay | Admitting: Emergency Medicine

## 2009-10-15 ENCOUNTER — Ambulatory Visit: Payer: Self-pay | Admitting: Family Medicine

## 2009-10-15 ENCOUNTER — Encounter: Payer: Self-pay | Admitting: Family Medicine

## 2009-10-15 DIAGNOSIS — M545 Low back pain: Secondary | ICD-10-CM

## 2009-10-15 LAB — CONVERTED CEMR LAB
Hemoglobin: 13.2 g/dL (ref 12.0–15.0)
MCHC: 32.1 g/dL (ref 30.0–36.0)
RBC: 4.31 M/uL (ref 3.87–5.11)

## 2009-10-17 ENCOUNTER — Encounter: Payer: Self-pay | Admitting: *Deleted

## 2009-10-18 ENCOUNTER — Encounter: Admission: RE | Admit: 2009-10-18 | Discharge: 2009-10-18 | Payer: Self-pay | Admitting: Family Medicine

## 2009-10-19 ENCOUNTER — Telehealth: Payer: Self-pay | Admitting: Family Medicine

## 2009-10-29 ENCOUNTER — Ambulatory Visit: Payer: Self-pay | Admitting: Family Medicine

## 2009-10-29 ENCOUNTER — Encounter: Payer: Self-pay | Admitting: Family Medicine

## 2009-11-16 ENCOUNTER — Emergency Department (HOSPITAL_COMMUNITY): Admission: EM | Admit: 2009-11-16 | Discharge: 2009-11-16 | Payer: Self-pay | Admitting: Emergency Medicine

## 2009-11-27 ENCOUNTER — Telehealth: Payer: Self-pay | Admitting: Family Medicine

## 2009-12-06 ENCOUNTER — Ambulatory Visit: Payer: Self-pay | Admitting: Family Medicine

## 2009-12-06 LAB — CONVERTED CEMR LAB
Chlamydia, Swab/Urine, PCR: NEGATIVE
GC Probe Amp, Urine: NEGATIVE

## 2009-12-18 ENCOUNTER — Telehealth: Payer: Self-pay | Admitting: Family Medicine

## 2009-12-19 ENCOUNTER — Telehealth: Payer: Self-pay | Admitting: Family Medicine

## 2009-12-20 ENCOUNTER — Encounter: Payer: Self-pay | Admitting: Family Medicine

## 2009-12-20 ENCOUNTER — Ambulatory Visit: Payer: Self-pay | Admitting: Family Medicine

## 2009-12-20 DIAGNOSIS — N898 Other specified noninflammatory disorders of vagina: Secondary | ICD-10-CM | POA: Insufficient documentation

## 2009-12-20 LAB — CONVERTED CEMR LAB
Beta hcg, urine, semiquantitative: NEGATIVE
Chlamydia, DNA Probe: NEGATIVE
GC Probe Amp, Genital: NEGATIVE
Whiff Test: NEGATIVE

## 2009-12-23 ENCOUNTER — Encounter: Payer: Self-pay | Admitting: Family Medicine

## 2010-01-07 ENCOUNTER — Telehealth: Payer: Self-pay | Admitting: *Deleted

## 2010-02-06 ENCOUNTER — Ambulatory Visit: Payer: Self-pay | Admitting: Family Medicine

## 2010-02-08 ENCOUNTER — Ambulatory Visit: Payer: Self-pay | Admitting: Family Medicine

## 2010-03-05 ENCOUNTER — Emergency Department (HOSPITAL_COMMUNITY): Admission: EM | Admit: 2010-03-05 | Discharge: 2010-03-06 | Payer: Self-pay | Admitting: Emergency Medicine

## 2010-03-06 ENCOUNTER — Ambulatory Visit (HOSPITAL_COMMUNITY): Admission: RE | Admit: 2010-03-06 | Discharge: 2010-03-06 | Payer: Self-pay | Admitting: Emergency Medicine

## 2010-03-06 ENCOUNTER — Ambulatory Visit: Payer: Self-pay | Admitting: Vascular Surgery

## 2010-03-06 ENCOUNTER — Encounter (INDEPENDENT_AMBULATORY_CARE_PROVIDER_SITE_OTHER): Payer: Self-pay | Admitting: Emergency Medicine

## 2010-03-11 ENCOUNTER — Telehealth (INDEPENDENT_AMBULATORY_CARE_PROVIDER_SITE_OTHER): Payer: Self-pay | Admitting: *Deleted

## 2010-03-21 ENCOUNTER — Ambulatory Visit: Payer: Self-pay | Admitting: Family Medicine

## 2010-04-01 ENCOUNTER — Telehealth: Payer: Self-pay | Admitting: Family Medicine

## 2010-04-09 ENCOUNTER — Ambulatory Visit: Payer: Self-pay | Admitting: Family Medicine

## 2010-04-09 LAB — CONVERTED CEMR LAB: TSH: 0.986 microintl units/mL (ref 0.350–4.500)

## 2010-04-10 ENCOUNTER — Encounter: Payer: Self-pay | Admitting: Family Medicine

## 2010-06-12 ENCOUNTER — Ambulatory Visit: Payer: Self-pay | Admitting: Family Medicine

## 2010-06-12 DIAGNOSIS — M7710 Lateral epicondylitis, unspecified elbow: Secondary | ICD-10-CM | POA: Insufficient documentation

## 2010-06-12 LAB — CONVERTED CEMR LAB
Chlamydia, Swab/Urine, PCR: NEGATIVE
GC Probe Amp, Urine: NEGATIVE

## 2010-06-13 ENCOUNTER — Telehealth: Payer: Self-pay | Admitting: *Deleted

## 2010-06-13 LAB — CONVERTED CEMR LAB
CO2: 19 meq/L (ref 19–32)
Chloride: 108 meq/L (ref 96–112)
Sodium: 136 meq/L (ref 135–145)

## 2010-06-14 ENCOUNTER — Telehealth (INDEPENDENT_AMBULATORY_CARE_PROVIDER_SITE_OTHER): Payer: Self-pay | Admitting: *Deleted

## 2010-06-21 ENCOUNTER — Ambulatory Visit: Payer: Self-pay | Admitting: Family Medicine

## 2010-06-27 ENCOUNTER — Encounter: Admission: RE | Admit: 2010-06-27 | Discharge: 2010-06-27 | Payer: Self-pay | Admitting: Family Medicine

## 2010-07-08 ENCOUNTER — Encounter: Payer: Self-pay | Admitting: Family Medicine

## 2010-08-27 ENCOUNTER — Emergency Department (HOSPITAL_COMMUNITY): Admission: EM | Admit: 2010-08-27 | Discharge: 2010-08-27 | Payer: Self-pay | Admitting: Family Medicine

## 2010-08-30 ENCOUNTER — Ambulatory Visit: Payer: Self-pay | Admitting: Family Medicine

## 2010-08-30 DIAGNOSIS — T8339XA Other mechanical complication of intrauterine contraceptive device, initial encounter: Secondary | ICD-10-CM

## 2010-09-17 ENCOUNTER — Ambulatory Visit: Payer: Self-pay | Admitting: Family Medicine

## 2010-09-27 ENCOUNTER — Ambulatory Visit (HOSPITAL_COMMUNITY): Admission: RE | Admit: 2010-09-27 | Discharge: 2010-09-27 | Payer: Self-pay | Admitting: Sports Medicine

## 2010-09-27 ENCOUNTER — Ambulatory Visit: Payer: Self-pay | Admitting: Family Medicine

## 2010-10-09 ENCOUNTER — Telehealth: Payer: Self-pay | Admitting: *Deleted

## 2010-10-11 ENCOUNTER — Encounter: Payer: Self-pay | Admitting: Family Medicine

## 2010-10-11 ENCOUNTER — Ambulatory Visit: Payer: Self-pay | Admitting: Family Medicine

## 2010-10-17 ENCOUNTER — Other Ambulatory Visit: Admission: RE | Admit: 2010-10-17 | Discharge: 2010-10-17 | Payer: Self-pay | Admitting: Obstetrics and Gynecology

## 2010-10-17 ENCOUNTER — Ambulatory Visit: Payer: Self-pay | Admitting: Obstetrics and Gynecology

## 2010-10-31 ENCOUNTER — Telehealth: Payer: Self-pay | Admitting: Sports Medicine

## 2010-11-20 ENCOUNTER — Encounter: Payer: Self-pay | Admitting: Sports Medicine

## 2010-11-28 ENCOUNTER — Encounter (INDEPENDENT_AMBULATORY_CARE_PROVIDER_SITE_OTHER): Payer: Self-pay | Admitting: *Deleted

## 2011-01-09 ENCOUNTER — Encounter: Payer: Self-pay | Admitting: Family Medicine

## 2011-01-09 ENCOUNTER — Ambulatory Visit: Admission: RE | Admit: 2011-01-09 | Discharge: 2011-01-09 | Payer: Self-pay | Source: Home / Self Care

## 2011-01-13 ENCOUNTER — Encounter: Payer: Self-pay | Admitting: Obstetrics and Gynecology

## 2011-01-13 ENCOUNTER — Ambulatory Visit: Admission: RE | Admit: 2011-01-13 | Discharge: 2011-01-13 | Payer: Self-pay | Source: Home / Self Care

## 2011-01-13 ENCOUNTER — Ambulatory Visit
Admission: RE | Admit: 2011-01-13 | Discharge: 2011-01-13 | Payer: Self-pay | Source: Home / Self Care | Attending: Obstetrics and Gynecology | Admitting: Obstetrics and Gynecology

## 2011-01-13 ENCOUNTER — Encounter: Payer: Self-pay | Admitting: Sports Medicine

## 2011-01-13 ENCOUNTER — Encounter: Payer: Self-pay | Admitting: Family Medicine

## 2011-01-13 LAB — CONVERTED CEMR LAB
Chloride: 102 meq/L (ref 96–112)
Hemoglobin: 13.4 g/dL (ref 12.0–15.0)
MCHC: 33.6 g/dL (ref 30.0–36.0)
MCHC: 33.2 g/dL (ref 30.0–36.0)
MCV: 92.6 fL (ref 78.0–100.0)
MCV: 92.5 fL (ref 78.0–100.0)
Platelets: 334 10*3/uL (ref 150–400)
Potassium: 4 meq/L (ref 3.5–5.3)
RBC: 4.31 M/uL (ref 3.87–5.11)
RBC: 4.14 M/uL (ref 3.87–5.11)
Sed Rate: 63 mm/hr — ABNORMAL HIGH (ref 0–22)

## 2011-01-14 ENCOUNTER — Encounter: Payer: Self-pay | Admitting: Obstetrics and Gynecology

## 2011-01-14 LAB — CONVERTED CEMR LAB: Trich, Wet Prep: NONE SEEN

## 2011-01-24 ENCOUNTER — Ambulatory Visit: Admit: 2011-01-24 | Payer: Self-pay

## 2011-01-28 NOTE — Progress Notes (Signed)
 ----   Converted from flag ---- ---- 06/13/2010 3:29 PM, Sarah Swaziland MD wrote: Ms. Jessica [name] had hep B #2 done 02/06/10, and Hep A #1 done 12/06/09.  She is interested in completing the course.  Could set her up for those?  Thanks! SJ ------------------------------ called and left message on voicemail that she can call for appointment and we can complete her series any time.  Theresia Lo RN  June 13, 2010 4:50 PM

## 2011-01-28 NOTE — Progress Notes (Signed)
Summary: phn msg   Phone Note Call from Patient Call back at [phone]   Caller: Patient Summary of Call: Pt checking on results of x-rays she had on her foot.  Also wanted to let Dr. Swaziland know her foot was causing her a lot of pain. Initial call taken by: Clydell Hakim,  October 09, 2010 1:32 PM  Follow-up for Phone Call        pt is calling again and is in pain with foot and can't put a shoe on. 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  I will ask the sports medicine folks if they would be able to help her, or if she should to to see and orthopedic doctor, but it will probalby be Monday before I can get an answer about this.  Is she taking the pain medicines? Additional Follow-up by: Sarah Swaziland MD,  October 10, 2010 9:31 PM    Additional Follow-up for Phone Call Additional follow up Details #2::    LVM for patient to call back Follow-up by: Jimmy Footman, CMA,  October 11, 2010 11:26 AM  Additional Follow-up for Phone Call Additional follow up Details #3:: Details for Additional Follow-up Action Taken: spoke with patient and informed her of her x rays. she sates that the pain meds are not helping. She has an appt today in which she will discuss that at the visit. Additional Follow-up by: Jimmy Footman, CMA,  October 11, 2010 1:48 PM

## 2011-01-28 NOTE — Assessment & Plan Note (Signed)
Summary: FU/KH   Vital Signs:  Patient profile:   50 year old female Height:      60 inches Weight:      276.3 pounds BMI:     54.16 Temp:     98.3 degrees F oral Pulse rate:   76 / minute BP sitting:   144 / 85  (left arm) Cuff size:   large  Vitals Entered By: Gladstone Pih (April 09, 2010 9:50 AM) CC: F/U Is Patient Diabetic? No Pain Assessment Patient in pain? no        Primary Care Provider:  Asees Manfredi Swaziland MD  CC:  F/U.  History of Present Illness: Pt feels stressed and depressed about weight.  Gaining, not losing.  Mood a little better.  Taking meds, wants to continue for a month at least. Drinking water, steaming veggies, not much walking because of back pain, leg swelling and pain.  NO candy.  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 Stoking helps, but it's really tight.  Got largest one available. Not wearing currently becasue she is wearing flipflops.   Went to [employer] and filled out paperwork for scholarship.  Tried jump rope.  Thought about DVDs, WII.   Concerned about hair loss.  Could it be from meds.  Can see scalp, which is new for her.  Does dye hair, pulls back.  Habits & Providers  Alcohol-Tobacco-Diet     Tobacco Status: never  Current Medications (verified): 1)  Topamax 25 Mg Tabs (Topiramate) .... Take One By Mouth Bid 2)  Imitrex 25 Mg  Tabs (Sumatriptan Succinate) .... Take One Pill At The Onset of Migraine, May Repeat X1 in 2 Hrs If Not Better 3)  Lisinopril 20 Mg Tabs (Lisinopril) .... Take One By Mouth Daily 4)  Ambien 5 Mg  Tabs (Zolpidem Tartrate) .Marland Kitchen.. 1 Tab By Mouth At Bedtime As Needed Sleep 5)  Valtrex 500 Mg  Tabs (Valacyclovir Hcl) .Marland Kitchen.. 1 Tab By Mouth Daily 6)  Ted Hose .... Put On in Am and Take Off in Pm Diagnosis:venous Insufficiency and Le Edema 7)  Flexeril 10 Mg  Tabs (Cyclobenzaprine Hcl) .Marland Kitchen.. 1 By Mouth Bid As Needed Back Pain 8)  Colace 100 Mg Caps (Docusate Sodium) .... Take One By Mouth Bid 9)  Miralax  Powd (Polyethylene Glycol 3350) .Marland Kitchen.. 17g in  8 Oz Water Dissolved Daily X 2 Wks 10)  Wellbutrin Xl 150 Mg Xr24h-Tab (Bupropion Hcl) .... Take 1 Pill  Po Every Morning For 4 Days, Then Increase To 2 Pills Every Day.  Allergies: No Known Drug Allergies  Review of Systems       see HPI  Physical Exam  General:  Obese,in no acute distress; alert,appropriate and cooperative throughout examination.  Vitals noted Head:  Thinning of hair throughout.  No breaking of hair at scalp.  Psych:  Nl grooming and dress.  No FOI/LOA.  Non-labile   Impression & Recommendations:  Problem # 1:  MORBID OBESITY (ICD-278.01) Pt declines appt with nutrition.  Will try to increase exercise. [facility]   Orders: TSH-[facility] ([id number]) [facility]- Est Level  3 (43154)  Problem # 2:  UNSPECIFIED ALOPECIA (ICD-704.00) Check TSH.  Unclear etiology  Problem # 3:  DEPRESSIVE DISORDER, RCR, MODERATE (ICD-296.32)  Seems to be slightly improved.  Pt feels her weight is causing her depression.  See #1.  Orders: [facility]- Est Level  3 (00867)  Complete Medication List: 1)  Topamax 25 Mg Tabs (Topiramate) .... Take one by  mouth bid 2)  Imitrex 25 Mg Tabs (Sumatriptan succinate) .... Take one pill at the onset of migraine, may repeat x1 in 2 hrs if not better 3)  Lisinopril 20 Mg Tabs (Lisinopril) .... Take one by mouth daily 4)  Ambien 5 Mg Tabs (Zolpidem tartrate) .Marland Kitchen.. 1 tab by mouth at bedtime as needed sleep 5)  Valtrex 500 Mg Tabs (Valacyclovir hcl) .Marland Kitchen.. 1 tab by mouth daily 6)  [facility]  .... Put on in am and take off in pm diagnosis:venous insufficiency and le edema 7)  Flexeril 10 Mg Tabs (Cyclobenzaprine hcl) .Marland Kitchen.. 1 by mouth bid as needed back pain 8)  Colace 100 Mg Caps (Docusate sodium) .... Take one by mouth bid 9)  Miralax Powd (Polyethylene glycol 3350) .Marland Kitchen.. 17g in 8 oz water dissolved daily x 2 wks 10)  Wellbutrin Xl 150 Mg Xr24h-tab (Bupropion hcl) .... Take 1 pill  po every morning for 4 days, then increase to 2 pills  every day.  Patient Instructions: 1)  We will check your thyroid today. 2)  Continue with the exercise.  Keep a food diary. 3)  I will review your meds and see if any could be causing the hair loss. 4)  Please see [place] again in 1 month.  Prescriptions: FLEXERIL 10 MG  TABS (CYCLOBENZAPRINE HCL) 1 by mouth bid as needed back pain  #60 x 0   Entered and Authorized by:   Matricia Begnaud Swaziland MD   Signed by:   Kienna Moncada Swaziland MD on 04/09/2010   Method used:   [facility]        [facility] [address] [phone][facility] (retail)       [address]       [place], [place]  [address]       Ph: [phone] or [phone]       Fax: [phone]   RxID:   [id number]

## 2011-01-28 NOTE — Miscellaneous (Signed)
  Clinical Lists Changes Medical Records Request Received medical records request Records went to [facility] Faxed on 10-30-2009 Kathi Simpers [facility])  November 28, 2010 9:41 AM

## 2011-01-28 NOTE — Letter (Signed)
Summary: Results Letter  [facility]  [address]   [place], [place] [address]   Phone: [phone]  Fax: [phone]    12/23/2009  KLYN KROENING [address] [place], [place]  [address]  Dear Ms. Jessica [name],  I am happy to inform you that the results of your recent labs, including gonorrhea, chlamydia, HIV, and syphilis tests, were all negative.  Sincerely,   Helane Rima DO  Appended Document: Results Letter mailed.

## 2011-01-28 NOTE — Assessment & Plan Note (Signed)
Summary: read ppd/kh   Nurse Visit   Allergies: No Known Drug Allergies  PPD Results    Date of reading: 02/08/2010    Results: < 5mm    Interpretation: negative  Orders Added: 1)  No Charge Patient Arrived (NCPA0) [NCPA0]
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
## 2011-01-28 NOTE — Assessment & Plan Note (Signed)
Summary: foot pain/Jessica [name]/Jessica [name]   Vital Signs:  Patient profile:   50 year old female Weight:      278.25 pounds Temp:     98.7 degrees F Pulse rate:   108 / minute BP sitting:   137 / 84  Primary Care [name]:  Sarah Swaziland MD   History of Present Illness: 50 yo patient returns for fu of L great toe pain.  Started mobic and advised to wear orthotics that were made some time ago.  She now claims she does not have the orthotics.  She also claims the mobic does not work.  She still wears flip flops and high heels.  She is wearing flip flops today.  She has not lost any weight.  Current Medications (verified): 1)  Topamax 25 Mg Tabs (Topiramate) .... Take One By Mouth Bid 2)  Imitrex 25 Mg  Tabs (Sumatriptan Succinate) .... Take One Pill At The Onset of Migraine, May Repeat X1 in 2 Hrs If Not Better 3)  Lisinopril 20 Mg Tabs (Lisinopril) .... Take One By Mouth Daily 4)  Ambien 5 Mg  Tabs (Zolpidem Tartrate) .Marland Kitchen.. 1 Tab By Mouth At Bedtime As Needed Sleep 5)  Valtrex 500 Mg  Tabs (Valacyclovir Hcl) .Marland Kitchen.. 1 Tab By Mouth Daily 6)  Ted Hose .... Put On in Am and Take Off in Pm Diagnosis:venous Insufficiency and Le Edema 7)  Flexeril 10 Mg  Tabs (Cyclobenzaprine Hcl) .Marland Kitchen.. 1 By Mouth Bid As Needed Back Pain 8)  Colace 100 Mg Caps (Docusate Sodium) .... Take One By Mouth Bid 9)  Miralax  Powd (Polyethylene Glycol 3350) .Marland Kitchen.. 17g in 8 Oz Water Dissolved Daily X 2 Wks 10)  Wellbutrin Xl 300 Mg Xr24h-Tab (Bupropion Hcl) .... Take 1 By Mouth Daily 11)  Hydrocodone-Acetaminophen 5-500 Mg Tabs (Hydrocodone-Acetaminophen) .Marland Kitchen.. 1 By Mouth Two Times A Day As Needed For Severe Pain.  Do Not Use With Tylenol or Acetaminophen 12)  Boric Acid  Gran (Boric Acid) .Marland Kitchen.. 1 Capsule Per Vagina 2-3 Times Weekly 13)  Mobic 7.5 Mg Tabs (Meloxicam) .... One To Two Tabs By Mouth Daily For Pain.  Allergies (verified): No Known Drug Allergies  Review of Systems       See HPI  Physical Exam  General:   Well-developed,well-nourished,in no acute distress; alert,appropriate and cooperative throughout examination Msk:  L Ankle: No visible erythema or swelling. Range of motion is full in all directions. Strength is 5/5 in all directions. Stable lateral and medial ligaments; squeeze test and kleiger test unremarkable; Talar dome nontender; No pain at base of 5th MT; No tenderness over cuboid; No tenderness over N spot or navicular prominence No tenderness on posterior aspects of lateral and medial malleolus No sign of peroneal tendon subluxations; Negative tarsal tunnel tinel's Able to walk 4 steps.  Pt does have some overpronation and collapse of her longitudinal arch when she walks.   TTP over 1st MTP joint on L.  FROM in toes however, no erythema. Additional Exam:  MSK [place]: Ultrasound used to visualize the 1st MTP joint.  Joint space well visualized with few overlying osteophytes.  Extensor hallucis longus tendon seen overlying the joint and intact.   Images saved.  L 1st MTP joint injection Consent obtained and verified. Sterile betadine prep. Furthur cleansed with alcohol. Topical analgesic spray: Ethyl chloride. Joint:L 1st metatarsophalangeal joint Approached in typical fashion with needle in a proximal to distal approach.  With real time ultrasound guidance needle seen sliding into joint just distal to  dorsal surface of first metatarsal.  Once needle visualized within joint space, medication injected easily. Completed without difficulty Meds:1/2 cc kenalog 40, 1cc lidocaine 1%. Needle:25g Aftercare instructions and Red flags advised.  Intern and CNM present in room as observers.     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   Injected today with immediate improvement in symptoms. Aftercare advised. Pt to fu with [facility] for orthotics and will fu with me if no improvement in toe pain by 2-3  days.  Orders: [facility]- Est Level  3 (57846) Injection, small joint- [facility] (20600) [place] LIMITED (96295)  Complete Medication List: 1)  Topamax 25 Mg Tabs (Topiramate) .... Take one by mouth bid 2)  Imitrex 25 Mg Tabs (Sumatriptan succinate) .... Take one pill at the onset of migraine, may repeat x1 in 2 hrs if not better 3)  Lisinopril 20 Mg Tabs (Lisinopril) .... Take one by mouth daily 4)  Ambien 5 Mg Tabs (Zolpidem tartrate) .Marland Kitchen.. 1 tab by mouth at bedtime as needed sleep 5)  Valtrex 500 Mg Tabs (Valacyclovir hcl) .Marland Kitchen.. 1 tab by mouth daily 6)  [facility]  .... Put on in am and take off in pm diagnosis:venous insufficiency and le edema 7)  Flexeril 10 Mg Tabs (Cyclobenzaprine hcl) .Marland Kitchen.. 1 by mouth bid as needed back pain 8)  Colace 100 Mg Caps (Docusate sodium) .... Take one by mouth bid 9)  Miralax Powd (Polyethylene glycol 3350) .Marland Kitchen.. 17g in 8 oz water dissolved daily x 2 wks 10)  Wellbutrin Xl 300 Mg Xr24h-tab (Bupropion hcl) .... Take 1 by mouth daily 11)  Hydrocodone-acetaminophen 5-500 Mg Tabs (Hydrocodone-acetaminophen) .Marland Kitchen.. 1 by mouth two times a day as needed for severe pain.  do not use with tylenol or acetaminophen 12)  Boric Acid Gran (Boric acid) .Marland Kitchen.. 1 capsule per vagina 2-3 times weekly 13)  Mobic 7.5 Mg Tabs (Meloxicam) .... One to two tabs by mouth daily for pain.  Patient Instructions: 1)  Great to see you, 2)  We injected your 1st metatarsophalangeal joint. 3)  Should get most improvement within 2 days. 4)  Come back to see me as needed. 5)  Be sure to make an appt with the sports medicine center to have orthotics made. 6)  -Dr. Karie Schwalbe.   Orders Added: 1)  [facility]- Est Level  3 [28413] 2)  Injection, small joint- [facility] [20600] 3)  [place] LIMITED [24401]

## 2011-01-28 NOTE — Assessment & Plan Note (Signed)
Summary: 2nd hep B & PPD,df   Nurse Visit   Vital Signs:  Patient profile:   50 year old female Temp:     98.3 degrees F  Vitals Entered By: Theresia Lo RN (February 06, 2010 2:34 PM)  Allergies: No Known Drug Allergies  Immunizations Administered:  PPD Skin Test:    Vaccine Type: PPD    Site: right forearm    Mfr: Sanofi Pasteur    Dose: 0.1 ml    Route: ID    Given by: Theresia Lo RN    Exp. Date: 05/25/2012    Lot #: [id number]  Hepatitis B Vaccine # 2:    Vaccine Type: HepB Adult    Site: right deltoid    Mfr: GlaxoSmithKline    Dose: 1.0 ml    Route: IM    Given by: Theresia Lo RN    Exp. Date: 04/25/2011    Lot #: [id number]    VIS given: 07/15/06 version given February 06, 2010.  Orders Added: 1)  TB Skin Test [86580] 2)  Admin 1st Vaccine [90471] 3)  Hepatitis B Vaccine >12yrs [90746] 4)  Admin of Any Addtl Vaccine [90472]    Orders Added: 1)  TB Skin Test [86580] 2)  Admin 1st Vaccine [90471] 3)  Hepatitis B Vaccine >87yrs [90746] 4)  Admin of Any Addtl Vaccine [90472]      Vital Signs:  Patient profile:   50 year old female Temp:     98.3 degrees F  Vitals Entered By: Theresia Lo RN (February 06, 2010 2:34 PM)

## 2011-01-28 NOTE — Progress Notes (Signed)
Summary: vaccine ?   Phone Note Call from Patient Call back at [facility] Phone [phone]   Caller: Patient Summary of Call: Wondering when she is to have next round of Hep A vaccine. Initial call taken by: Clydell Hakim,  June 14, 2010 9:27 AM  Follow-up for Phone Call        patient advised she can make appointment anytime to complete series. Follow-up by: Theresia Lo RN,  June 14, 2010 10:08 AM

## 2011-01-28 NOTE — Progress Notes (Signed)
Summary: phn msg   Phone Note Call from Patient Call back at Home Phone [phone]   Caller: Patient Summary of Call: wants to know when she should come back to get her 2nd Hep A&B shots Initial call taken by: De Nurse,  January 07, 2010 8:45 AM  Follow-up for Phone Call        told her hep a was 6 months apart & 2nd Hep B was 2 months after the first. she will get 2nd hep b in feb. told her to call a day before she wanted to come in Follow-up by: Golden Circle RN,  January 07, 2010 8:58 AM
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
## 2011-01-28 NOTE — Miscellaneous (Signed)
Summary: Consent Shoulder Inj  Consent Shoulder Inj   Imported By: Clydell Hakim 06/27/2010 16:26:03  _____________________________________________________________________  External Attachment:    Type:   Image     Comment:   External Document

## 2011-01-28 NOTE — Progress Notes (Signed)
Summary: triage   Phone Note Call from Patient Call back at Home Phone [phone]   Caller: Patient Summary of Call: Wondering what kind of vitamin she can take.  Has been taking One-A-Day for Women, but making her so sick.   Initial call taken by: Clydell Hakim,  April 01, 2010 10:10 AM  Follow-up for Phone Call        took one yesterday & today. states they make her vomit. started taking due to tiredness. thought they would give her more energy. told her I will relay this to pcp & call her with response Follow-up by: Golden Circle RN,  April 01, 2010 10:16 AM  Additional Follow-up for Phone Call Additional follow up Details #1::        She could try a children's vitamin to see if that it is better.  It would be ok to take 2 of those a day. Additional Follow-up by: Roshawna Colclasure Swazil[place] MD,  April 02, 2010 8:57 AM    Additional Follow-up for Phone Call Additional follow up Details #2::    gave her pcp advise. she will try Follow-up by: Golden Circle RN,  April 02, 2010 10:12 AM

## 2011-01-28 NOTE — Progress Notes (Signed)
Summary: Test results   Phone Note Call from Patient Call back at Home Phone [phone]   Reason for Call: Lab or Test Results Summary of Call: pt went to the hospital a few days ago, had a test to check for blood clots in her leg, pt wants to know if we could get the results? Initial call taken by: Knox Royalty,  March 11, 2010 2:48 PM  Follow-up for Phone Call        to PCP Follow-up by: Gladstone Pih,  March 11, 2010 3:07 PM  Additional Follow-up for Phone Call Additional follow up Details #1::        Please let her know the studies were normal.  She does not have a clot in her lungs or her leg.   Additional Follow-up by: Sarah Swaziland MD,  March 13, 2010 9:53 AM    Additional Follow-up for Phone Call Additional follow up Details #2::    left message to return call Follow-up by: Gladstone Pih,  March 13, 2010 10:14 AM  Additional Follow-up for Phone Call Additional follow up Details #3:: Details for Additional Follow-up Action Taken: Pt notified,  Pt still having problems with swelling and pain in legs, wants to know what she can do.Marland KitchenMarland KitchenMisty Stanley  She can schedule an appt with [place].  I think I have openings tomorrow.  Sarah Swaziland MD  March 13, 2010 11:42 AM  Additional Follow-up by: Gladstone Pih,  March 13, 2010 11:10 AM    To Admin team to call and sched appt with Dr Swaziland.Marland KitchenMarland KitchenGladstone Pih  March 13, 2010 1:50 PM  Appended Document: Test results LVM for pt concerning making appt.
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
## 2011-01-28 NOTE — Miscellaneous (Signed)
Summary: Procedures Consent  Procedures Consent   Imported By: De Nurse 10/16/2010 15:36:12  _____________________________________________________________________  External Attachment:    Type:   Image     Comment:   External Document

## 2011-01-28 NOTE — Progress Notes (Signed)
Summary: dr.t wants to know why pt cancelled appt   Phone Note Outgoing Call   Call placed by: Loralee Pacas CMA,  October 31, 2010 9:20 AM Summary of Call: lvm for pt to return call to find out why she cancelled her appt with [facility]  Follow-up for Phone Call        pt states that there was a death in family. I gave her the phone number for [facility] so that she can reschedule the appt due to her having issues witrh date that it can be scheduled Follow-up by: Jimmy Footman, CMA,  November 01, 2010 10:53 AM
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
## 2011-01-28 NOTE — Assessment & Plan Note (Signed)
Summary: shots,df   Nurse Visit   Vital Signs:  Patient profile:   50 year old female Temp:     98.2 degrees F  Vitals Entered By: Theresia Lo RN (June 21, 2010 10:00 AM)  Allergies: No Known Drug Allergies  Immunizations Administered:  Hepatitis B Vaccine # 3:    Vaccine Type: HepB Adult    Site: right deltoid    Mfr: GlaxoSmithKline    Dose: 0.1 ml    Route: IM    Given by: Theresia Lo RN    Exp. Date: 04/25/2011    Lot #: [id number]    VIS given: 07/15/06 version given June 21, 2010.  Hepatitis A Vaccine # 2:    Vaccine Type: HepA    Site: left deltoid    Mfr: GlaxoSmithKline    Dose: 0.1 ml    Route: IM    Given by: Theresia Lo RN    Exp. Date: 09/13/2011    Lot #: [id number]    VIS given: 03/18/05 version given June 21, 2010.  Orders Added: 1)  Hepatitis B Vaccine >61yrs [90746] 2)  Admin 1st Vaccine [90471] 3)  Hepatitis A Vaccine (Adult Dose) [90632] 4)  Admin of Any Addtl Vaccine [90472]     Vital Signs:  Patient profile:   50 year old female Temp:     98.2 degrees F  Vitals Entered By: Theresia Lo RN (June 21, 2010 10:00 AM)

## 2011-01-28 NOTE — Letter (Signed)
Summary: Generic Letter  [facility]  [address]   [place], [place] [address]   Phone: [phone]  Fax: [phone]    04/10/2010  CLARABELL MATSUOKA [address] [place], [place]  [address]  Dear [name], The thyroid test we did yesterday was normal.  I am happy to see that your thyroid is working well.  Please call us if you have any questions.   Sincerely,   Sarah Swaziland MD  Appended Document: Generic Letter mailed

## 2011-01-30 NOTE — Miscellaneous (Signed)
Summary: charges 6/15   Clinical Lists Changes  Orders: Added new Test order of [facility]- Est  Level 4 (28413) - Signed

## 2011-01-30 NOTE — Letter (Signed)
Summary: Generic Letter  [facility]  [address]   [place], [place] [address]   Phone: [phone]  Fax: [phone]    01/13/2011  Jessica [name] [address] [place], [place]  [address]  Dear [name],   I am happy to let you know that all the labs we checked last week came back normal.  Please let us know if you have any questions.        Sincerely,   Sarah Swaziland MD   Appended Document: Generic Letter mailed

## 2011-01-30 NOTE — Assessment & Plan Note (Signed)
Summary: PAIN IN BOTH ARMS/RH   Vital Signs:  Patient profile:   50 year old female Height:      60 inches Weight:      281. pounds  Vitals Entered By: Jimmy Footman, CMA (January 09, 2011 4:12 PM)  Primary Care Provider:  Sarah Swaziland MD   History of Present Illness: Pt reports pain, swelling and burning in R leg.  Tried support stocking, took fluid pill from [employer]. No swelling on L leg.  Not currently swollen, but if she sits for an hour, it swells.  + Burning and tingling in knee area.  Aleve not working.  Taking 2 pills two times a day.   Also wants to talk about weight.  Went down to 269 and " got stuck".  Now eating like she should, lots of water.  Had sweets at holiday time.   Not sleeping well. taking Ambien q o night every other week.  Goes to bathroom throughout the night.  Has trouble falling asleep.  Reads Bible in bed, falls off to sleep.  No TV no lights.  Tries to go to bed same tiem every night, but may be doing laundry late.  Always wakes up at 2-4 am and is up for the night.  Gets in bed and tries to read and is tossing and turning.  Has been trying to not take AMbien.  If takes Ambien, she gets sleep.  Takes before going to bed, and sleeps all night those nights.   Stress.  Bills, transportation.  Mind runs all the time.  COnsidering counsling to help.  Allergies: No Known Drug Allergies  Review of Systems       see HPI  Physical Exam  General:  Obese,well-nourished,in no acute distress; alert,appropriate and cooperative throughout examination.  Vitals noted. Extremities:  Exam limited by body habitus.  Knees without crepitus or effusion or TTP.  No edema appreciated either leg.  (Pt reports no swelling currently).     Impression & Recommendations:  Problem # 1:  VENOUS INSUFFICIENCY, LEGS (ICD-459.81)  Swelling likely due to venous insufficiency.  Advised compression hose, elevation.  Pt to return to clinic when leg swollen - will call for work in appt if this  happens again.  Orders: [facility]- Est Level  3 ([address]109)  Problem # 2:  MORBID OBESITY (ICD-278.01)  REviewed healthy lifestyle.  Offered nutrition consult.  Pt declned.    Orders: [facility]- Est Level  3 (60454)  Problem # 3:  INSOMNIA (ICD-780.52)  REviewed sleep hygeine.  Sleep diary. Her updated medication list for this problem includes:    Ambien 5 Mg Tabs (Zolpidem tartrate) .Marland Kitchen... 1 tab by mouth at bedtime as needed sleep  Orders: [facility]- Est Level  3 (09811)  Complete Medication List: 1)  Topamax 25 Mg Tabs (Topiramate) .... Take one by mouth bid 2)  Imitrex 25 Mg Tabs (Sumatriptan succinate) .... Take one pill at the onset of migraine, may repeat x1 in 2 hrs if not better 3)  Lisinopril 20 Mg Tabs (Lisinopril) .... Take one by mouth daily 4)  Ambien 5 Mg Tabs (Zolpidem tartrate) .Marland Kitchen.. 1 tab by mouth at bedtime as needed sleep 5)  Valtrex 500 Mg Tabs (Valacyclovir hcl) .Marland Kitchen.. 1 tab by mouth daily 6)  [facility]  .... Put on in am and take off in pm diagnosis:venous insufficiency and le edema 7)  Flexeril 10 Mg Tabs (Cyclobenzaprine hcl) .Marland Kitchen.. 1 by mouth bid as needed back pain 8)  Colace 100  Mg Caps (Docusate sodium) .... Take one by mouth bid 9)  Miralax Powd (Polyethylene glycol 3350) .Marland Kitchen.. 17g in 8 oz water dissolved daily x 2 wks 10)  Wellbutrin Xl 300 Mg Xr24h-tab (Bupropion hcl) .... Take 1 by mouth daily 11)  Hydrocodone-acetaminophen 5-500 Mg Tabs (Hydrocodone-acetaminophen) .Marland Kitchen.. 1 by mouth two times a day as needed for severe pain.  do not use with tylenol or acetaminophen 12)  Boric Acid Gran (Boric acid) .Marland Kitchen.. 1 capsule per vagina 2-3 times weekly 13)  Mobic 7.5 Mg Tabs (Meloxicam) .... One to two tabs by mouth daily for pain. 14)  Metronidazole 500 Mg Tabs (Metronidazole) .Marland Kitchen.. 1 by mouth two times a day for 7 days  Other Orders: Basic Met-[facility] ([id number]) CBC-[facility] (91478)  Patient Instructions: 1)  Please call us and come see [place] on a day when your leg is swollen.  2)  Keep a  sleep diary and keep track of how much you are sleeping if possible. 3)  You can call Dr. Pascal Lux for an appointment to discuss the stress.   Orders Added: 1)  Basic Met-[facility] [[id number]] 2)  CBC-[facility] [85027] 3)  [facility]- Est Level  3 [57846]

## 2011-01-30 NOTE — Assessment & Plan Note (Signed)
Summary: injection/eo   Vital Signs:  Patient profile:   50 year old female Height:      60 inches Weight:      278.56 pounds BMI:     54.60 Pulse rate:   92 / minute BP sitting:   117 / 80  Vitals Entered By: Terese Door (January 13, 2011 4:36 PM) CC: Toe injection Is Patient Diabetic? No Pain Assessment Patient in pain? no        Primary Care Provider:  Sarah Swaziland MD  CC:  Toe injection.  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    Injected 3 months ago, pt with excellent results.  Lasted  ~ 2 months.  Only put in 1/2 cc kenalog last time.  Here for repeat injection.  Was sent to [facility] for possible orthotics with 1st MT ray posting but pt cancelled appt.  Had no complications and is excited to get another injection.  She has tried all analgesics without improvement.  Habits & Providers  Alcohol-Tobacco-Diet     Tobacco Status: never  Current Medications (verified): 1)  Topamax 25 Mg Tabs (Topiramate) .... Take One By Mouth Bid 2)  Imitrex 25 Mg  Tabs (Sumatriptan Succinate) .... Take One Pill At The Onset of Migraine, May Repeat X1 in 2 Hrs If Not Better 3)  Lisinopril 20 Mg Tabs (Lisinopril) .... Take One By Mouth Daily 4)  Ambien 5 Mg  Tabs (Zolpidem Tartrate) .Marland Kitchen.. 1 Tab By Mouth At Bedtime As Needed Sleep 5)  Valtrex 500 Mg  Tabs (Valacyclovir Hcl) .Marland Kitchen.. 1 Tab By Mouth Daily 6)  Ted Hose .... Put On in Am and Take Off in Pm Diagnosis:venous Insufficiency and Le Edema 7)  Flexeril 10 Mg  Tabs (Cyclobenzaprine Hcl) .Marland Kitchen.. 1 By Mouth Bid As Needed Back Pain 8)  Colace 100 Mg Caps (Docusate Sodium) .... Take One By Mouth Bid 9)  Miralax  Powd (Polyethylene Glycol 3350) .Marland Kitchen.. 17g in 8 Oz Water Dissolved Daily X 2 Wks 10)  Wellbutrin Xl 300 Mg Xr24h-Tab (Bupropion Hcl) .... Take 1 By Mouth Daily 11)  Hydrocodone-Acetaminophen 5-500 Mg Tabs (Hydrocodone-Acetaminophen) .Marland Kitchen.. 1 By Mouth Two Times A Day As Needed For Severe Pain.  Do Not Use With  Tylenol or Acetaminophen 12)  Boric Acid  Gran (Boric Acid) .Marland Kitchen.. 1 Capsule Per Vagina 2-3 Times Weekly 13)  Mobic 7.5 Mg Tabs (Meloxicam) .... One To Two Tabs By Mouth Daily For Pain. 14)  Metronidazole 500 Mg Tabs (Metronidazole) .Marland Kitchen.. 1 By Mouth Two Times A Day For 7 Days  Allergies (verified): No Known Drug Allergies  Review of Systems       See HPI  Physical Exam  General:  Well-developed,well-nourished,in no acute distress; alert,appropriate and cooperative throughout examination Msk:  L Ankle: No visible erythema or swelling. Range of motion is full in all directions. Strength is 5/5 in all directions. Stable lateral and medial ligaments; squeeze test and kleiger test unremarkable; Talar dome nontender; No pain at base of 5th MT; No tenderness over cuboid; No tenderness over N spot or navicular prominence No tenderness on posterior aspects of lateral and medial malleolus No sign of peroneal tendon subluxations; Negative tarsal tunnel tinel's Able to walk 4 steps.  Pt does have some overpronation and collapse of her longitudinal arch when she walks.   TTP over 1st MTP joint on L.  FROM in toes however, no erythema. Additional Exam:  MSK [place]: Ultrasound used to visualize the  1st MTP joint.  Joint space well visualized with few overlying osteophytes.  Extensor hallucis longus tendon seen overlying the joint and intact.   Image saved.  L 1st MTP joint injection Verbal consent obtained. Cleansed with alcohol. 1.5 cc lidocaine 1% infiltrated over joint in subcutis. Joint:L 1st metatarsophalangeal joint Approached in typical fashion with needle in a proximal to distal approach.  Dr. Denyse Amass helped by providing axial traction on the great toe widening the joint space. With real time ultrasound guidance needle seen sliding into joint just distal to dorsal surface of first metatarsal.  Once needle visualized within joint space, medication injected easily, joint capsule seen  distending under US guidance. Completed without difficulty Meds:1/2 cc kenalog 40, 1cc lidocaine 1%. Needle:25g Aftercare instructions and Red flags advised.     Impression & Recommendations:  Problem # 1:  TOE PAIN (ICD-729.5) Assessment Improved Pain 2/2 severe DJD 1st MTP on L.   Injected again today with immediate improvement in symptoms. As I am using only 1/2 cc kenalog, could theoretically inject every 1.5 months if needed. Aftercare advised. Pt to fu with [facility] (she will make another appt) for orthotics/1st MT ray posting and will fu with me prn.  Orders: [place] LIMITED [phone]) [place] NDL PLMT IMG S&I [phone]) Injection, small joint- [facility] (20600)  Complete Medication List: 1)  Topamax 25 Mg Tabs (Topiramate) .... Take one by mouth bid 2)  Imitrex 25 Mg Tabs (Sumatriptan succinate) .... Take one pill at the onset of migraine, may repeat x1 in 2 hrs if not better 3)  Lisinopril 20 Mg Tabs (Lisinopril) .... Take one by mouth daily 4)  Ambien 5 Mg Tabs (Zolpidem tartrate) .Marland Kitchen.. 1 tab by mouth at bedtime as needed sleep 5)  Valtrex 500 Mg Tabs (Valacyclovir hcl) .Marland Kitchen.. 1 tab by mouth daily 6)  [facility]  .... Put on in am and take off in pm diagnosis:venous insufficiency and le edema 7)  Flexeril 10 Mg Tabs (Cyclobenzaprine hcl) .Marland Kitchen.. 1 by mouth bid as needed back pain 8)  Colace 100 Mg Caps (Docusate sodium) .... Take one by mouth bid 9)  Miralax Powd (Polyethylene glycol 3350) .Marland Kitchen.. 17g in 8 oz water dissolved daily x 2 wks 10)  Wellbutrin Xl 300 Mg Xr24h-tab (Bupropion hcl) .... Take 1 by mouth daily 11)  Hydrocodone-acetaminophen 5-500 Mg Tabs (Hydrocodone-acetaminophen) .Marland Kitchen.. 1 by mouth two times a day as needed for severe pain.  do not use with tylenol or acetaminophen 12)  Boric Acid Gran (Boric acid) .Marland Kitchen.. 1 capsule per vagina 2-3 times weekly 13)  Mobic 7.5 Mg Tabs (Meloxicam) .... One to two tabs by mouth daily for pain. 14)  Metronidazole 500 Mg Tabs (Metronidazole) .Marland Kitchen.. 1 by mouth two  times a day for 7 days   Orders Added: 1)  [place] LIMITED [76882] 2)  [place] NDL PLMT IMG S&I [09811] 3)  Injection, small joint- [facility] [20600]

## 2011-02-05 ENCOUNTER — Encounter: Payer: Self-pay | Admitting: *Deleted

## 2011-02-05 ENCOUNTER — Ambulatory Visit (INDEPENDENT_AMBULATORY_CARE_PROVIDER_SITE_OTHER): Payer: Medicaid Other | Admitting: *Deleted

## 2011-02-05 ENCOUNTER — Ambulatory Visit: Payer: Medicaid Other | Admitting: *Deleted

## 2011-02-05 DIAGNOSIS — Z111 Encounter for screening for respiratory tuberculosis: Secondary | ICD-10-CM

## 2011-02-07 ENCOUNTER — Encounter: Payer: Medicaid Other | Admitting: Family Medicine

## 2011-02-07 LAB — TB SKIN TEST: Induration: 0

## 2011-02-10 ENCOUNTER — Ambulatory Visit (INDEPENDENT_AMBULATORY_CARE_PROVIDER_SITE_OTHER): Payer: Medicaid Other | Admitting: Family Medicine

## 2011-02-10 ENCOUNTER — Encounter: Payer: Self-pay | Admitting: Family Medicine

## 2011-02-10 DIAGNOSIS — M79609 Pain in unspecified limb: Secondary | ICD-10-CM

## 2011-02-10 NOTE — Progress Notes (Signed)
Please sign chart . This was a nurse visit that was entered as office visit.

## 2011-02-11 NOTE — Progress Notes (Signed)
Please sign off chart. This was a nurse visit that was scheduled as an office visit and I cannot sign off

## 2011-02-18 ENCOUNTER — Other Ambulatory Visit: Payer: Self-pay | Admitting: Family Medicine

## 2011-02-18 NOTE — Telephone Encounter (Signed)
Refill request

## 2011-02-19 NOTE — Assessment & Plan Note (Signed)
Summary: tb test/eo   Nurse Visit   Allergies: No Known Drug Allergies

## 2011-02-19 NOTE — Assessment & Plan Note (Signed)
Summary: np,l toe pain,mc   Vital Signs:  Patient profile:   50 year old female Height:      60 inches Weight:      278.56 pounds BMI:     54.60 Pulse rate:   86 / minute BP sitting:   121 / 82  (left arm)  Vitals Entered By: Lillia Pauls CMA (February 10, 2011 3:40 PM) CC: rt lower leg pain   Primary Care Provider:  Sarah Swaziland MD  CC:  rt lower leg pain.  History of Present Illness: right shin pain for several weeks--kind of a burning pain. Worse after walking  also some B lateral foot pain--also worse after walking  had lost a lot of weight and has recently put some back on.  Habits & Providers  Alcohol-Tobacco-Diet     Tobacco Status: never  Allergies: No Known Drug Allergies  Physical Exam  General:  alert, well-developed, well-nourished, well-hydrated, and overweight-appearing.   Msk:  GAIT is a little antalgic secondary  to weight--large thighs compromise her leg swing--esp onteh right. this causes a circumlocution of right foot in forward swing, she then lands laterally and rolls medially.  B knees are ligamentously intact. no effusion  anterior tibilais mildly ttp right  intact dorsiflexion adn plabntar flexion B   Impression & Recommendations:  Problem # 1:  LEG PAIN, RIGHT (ICD-729.5) anterior tib strain (overuse) will have her do some dorsiflexion exercises w theraband  also add VMO strengtheneing  Problem # 2:  PAIN-FOOT (ICD-729.5)  lateral wedges B shoes and rtc for custom orthotics  Orders: Foot Orthosis ( Arch Strap/Heel Cup) [phone])  Complete Medication List: 1)  Topamax 25 Mg Tabs (Topiramate) .... Take one by mouth bid 2)  Imitrex 25 Mg Tabs (Sumatriptan succinate) .... Take one pill at the onset of migraine, may repeat x1 in 2 hrs if not better 3)  Lisinopril 20 Mg Tabs (Lisinopril) .... Take one by mouth daily 4)  Ambien 5 Mg Tabs (Zolpidem tartrate) .Marland Kitchen.. 1 tab by mouth at bedtime as needed sleep 5)  Valtrex 500 Mg Tabs  (Valacyclovir hcl) .Marland Kitchen.. 1 tab by mouth daily 6)  [facility]  .... Put on in am and take off in pm diagnosis:venous insufficiency and le edema 7)  Flexeril 10 Mg Tabs (Cyclobenzaprine hcl) .Marland Kitchen.. 1 by mouth bid as needed back pain 8)  Colace 100 Mg Caps (Docusate sodium) .... Take one by mouth bid 9)  Miralax Powd (Polyethylene glycol 3350) .Marland Kitchen.. 17g in 8 oz water dissolved daily x 2 wks 10)  Wellbutrin Xl 300 Mg Xr24h-tab (Bupropion hcl) .... Take 1 by mouth daily 11)  Hydrocodone-acetaminophen 5-500 Mg Tabs (Hydrocodone-acetaminophen) .Marland Kitchen.. 1 by mouth two times a day as needed for severe pain.  do not use with tylenol or acetaminophen 12)  Boric Acid Gran (Boric acid) .Marland Kitchen.. 1 capsule per vagina 2-3 times weekly 13)  Mobic 7.5 Mg Tabs (Meloxicam) .... One to two tabs by mouth daily for pain. 14)  Metronidazole 500 Mg Tabs (Metronidazole) .Marland Kitchen.. 1 by mouth two times a day for 7 days   Orders Added: 1)  Est. Patient Level IV [60454] 2)  Foot Orthosis ( Arch Strap/Heel Cup) [U9811]

## 2011-02-24 ENCOUNTER — Encounter: Payer: Medicaid Other | Admitting: Family Medicine

## 2011-03-09 ENCOUNTER — Emergency Department (HOSPITAL_COMMUNITY)
Admission: EM | Admit: 2011-03-09 | Discharge: 2011-03-10 | Disposition: A | Discharge status: Home / Self Care | Payer: Medicaid Other | Attending: Emergency Medicine | Admitting: Emergency Medicine

## 2011-03-09 DIAGNOSIS — M79609 Pain in unspecified limb: Secondary | ICD-10-CM | POA: Insufficient documentation

## 2011-03-09 DIAGNOSIS — I10 Essential (primary) hypertension: Secondary | ICD-10-CM | POA: Insufficient documentation

## 2011-03-09 DIAGNOSIS — M19079 Primary osteoarthritis, unspecified ankle and foot: Secondary | ICD-10-CM | POA: Insufficient documentation

## 2011-03-10 ENCOUNTER — Emergency Department (HOSPITAL_COMMUNITY): Payer: Medicaid Other

## 2011-03-13 ENCOUNTER — Encounter: Payer: Self-pay | Admitting: *Deleted

## 2011-03-13 LAB — GC/CHLAMYDIA PROBE AMP, GENITAL
Chlamydia, DNA Probe: NEGATIVE
GC Probe Amp, Genital: NEGATIVE

## 2011-03-13 LAB — POCT URINALYSIS DIPSTICK
Glucose, UA: NEGATIVE mg/dL
Hgb urine dipstick: NEGATIVE
Nitrite: NEGATIVE
Urobilinogen, UA: 1 mg/dL (ref 0.0–1.0)

## 2011-03-13 LAB — WET PREP, GENITAL: Yeast Wet Prep HPF POC: NONE SEEN

## 2011-03-13 LAB — POCT PREGNANCY, URINE: Preg Test, Ur: NEGATIVE

## 2011-03-17 ENCOUNTER — Encounter: Payer: Self-pay | Admitting: Family Medicine

## 2011-03-17 ENCOUNTER — Encounter (INDEPENDENT_AMBULATORY_CARE_PROVIDER_SITE_OTHER): Payer: Medicaid Other | Admitting: Family Medicine

## 2011-03-17 DIAGNOSIS — Q6650 Congenital pes planus, unspecified foot: Secondary | ICD-10-CM

## 2011-03-17 HISTORY — DX: Congenital pes planus, unspecified foot: Q66.50

## 2011-03-21 ENCOUNTER — Ambulatory Visit: Payer: Medicaid Other | Admitting: Family Medicine

## 2011-03-21 ENCOUNTER — Emergency Department (HOSPITAL_COMMUNITY)
Admission: EM | Admit: 2011-03-21 | Discharge: 2011-03-21 | Disposition: A | Discharge status: Home / Self Care | Payer: Medicaid Other | Attending: Emergency Medicine | Admitting: Emergency Medicine

## 2011-03-21 ENCOUNTER — Ambulatory Visit (INDEPENDENT_AMBULATORY_CARE_PROVIDER_SITE_OTHER): Payer: Medicaid Other | Admitting: Family Medicine

## 2011-03-21 ENCOUNTER — Encounter: Payer: Self-pay | Admitting: Family Medicine

## 2011-03-21 DIAGNOSIS — X58XXXA Exposure to other specified factors, initial encounter: Secondary | ICD-10-CM | POA: Insufficient documentation

## 2011-03-21 DIAGNOSIS — M549 Dorsalgia, unspecified: Secondary | ICD-10-CM

## 2011-03-21 DIAGNOSIS — T783XXA Angioneurotic edema, initial encounter: Secondary | ICD-10-CM

## 2011-03-21 DIAGNOSIS — I10 Essential (primary) hypertension: Secondary | ICD-10-CM | POA: Insufficient documentation

## 2011-03-21 DIAGNOSIS — R51 Headache: Secondary | ICD-10-CM | POA: Insufficient documentation

## 2011-03-21 DIAGNOSIS — R220 Localized swelling, mass and lump, head: Secondary | ICD-10-CM | POA: Insufficient documentation

## 2011-03-21 LAB — POCT I-STAT, CHEM 8
Calcium, Ion: 1.05 mmol/L — ABNORMAL LOW (ref 1.12–1.32)
Chloride: 103 mEq/L (ref 96–112)
Chloride: 106 mEq/L (ref 96–112)
HCT: 38 % (ref 36.0–46.0)
HCT: 39 % (ref 36.0–46.0)
Hemoglobin: 12.9 g/dL (ref 12.0–15.0)
Hemoglobin: 13.3 g/dL (ref 12.0–15.0)
Potassium: 3.9 mEq/L (ref 3.5–5.1)
Potassium: 5.1 mEq/L (ref 3.5–5.1)

## 2011-03-21 LAB — PREGNANCY, URINE: Preg Test, Ur: NEGATIVE

## 2011-03-21 MED ORDER — CYCLOBENZAPRINE HCL 10 MG PO TABS: 10.0000 mg | ORAL_TABLET | Freq: Two times a day (BID) | ORAL | Status: DC | PRN

## 2011-03-21 MED ORDER — MELOXICAM 7.5 MG PO TABS: 7.5000 mg | ORAL_TABLET | Freq: Every day | ORAL | Status: DC

## 2011-03-21 MED ORDER — LORATADINE 10 MG PO TABS: 10.0000 mg | ORAL_TABLET | Freq: Every day | ORAL | Status: DC

## 2011-03-21 NOTE — Patient Instructions (Signed)
You need to tell folks you are allergic to ACEs and ARBs. See Dr. Swaziland in 2 weeks.  Try to get your blood pressure measured 2 or 3 times before that. The loratadine is like benadryl.  Take it as long as your lip is swollen

## 2011-03-21 NOTE — Progress Notes (Signed)
  Subjective:    Patient ID: Jessica [name], female    DOB: Jan 12, 1961, 50 y.o.   MRN: [id number]  HPI Seen in ER last night for marked lip swelling.  Given steroid iv and benadryl.  Thought to be secondary to ACE.  No other new meds.  No apparent allergens.No tongue swelling or shortness of breath.    Review of Systems     Objective:   Physical Exam    Marked lower lip swelling Lungs clear Neck normal    Assessment & Plan:

## 2011-03-21 NOTE — Assessment & Plan Note (Signed)
Marked chart allergic to ACE and ARB

## 2011-03-21 NOTE — H&P (Signed)
Family Medicine Teaching [facility] Admission History and Physical  Patient name: Jessica [name] Medical record number: [id number] Date of birth: 29-May-1961 Age: 51 y.o. Gender: female  Primary Care Provider: Burman Blacksmith., MD  Chief Complaint: swollen lip History of Present Illness: Jessica [name] is a 50 y.o. year old female with a Pmhx of HTN presented with lower lip swelling.  Patient has been taking Lisinopril 20mg  daily for approx. 1 year.  Symptoms began last night at bedtime.  Pt woke up with edema to right lower lip.  She came to ED because of this.  She denies difficulty breathing, shortness of breath, difficulty swallowing, chest pain, palpitations.  Her speech is intact.  In the ED she was given Pepcid, Solumedral and Benadryl.  Her last dose of Lisinopril was taken on 03/20/11 around lunchtime.   Past Medical History: Chronic anal fissures, has IUD Obesity HTN Depression (inpt admission 2003) Migraine headaches  Past Surgical History: Cervical disc surg - 04/21/2000, Cholecystectomy -, C-section x 3 last 1992, IUD - 01/14/2006, Tubal ligation--1992  Social History: Single mom of 3 children28, 20, 18.  Third child is a bit of a challenge.  89 yo in college.  Active in church.  No smoking.  ETOH on holidays (1 glass of wine).  No other substances.; Receives disability for neck injury--works part time.  Family History: Asthma, Obesity Mother with bilateral leg swelling from "poor circulation", DM.   Allergies: NKDA    Current Outpatient Prescriptions  Medication Sig Dispense Refill  . Boric Acid GRAN Place 1 capsule vaginally. 2 to 3 times weekly      . buPROPion (WELLBUTRIN XL) 300 MG 24 hr tablet Take 300 mg by mouth daily.        . cyclobenzaprine (FLEXERIL) 10 MG tablet Take 10 mg by mouth 2 (two) times daily as needed. For back spasms        . docusate sodium (COLACE) 100 MG capsule Take 100 mg by mouth 2 (two) times daily.        Marland Kitchen  HYDROcodone-acetaminophen (VICODIN) 5-500 MG per tablet Take 1 tablet by mouth 2 (two) times daily as needed. for severe pain.  Do not use with tylenol.        Marland Kitchen lisinopril (PRINIVIL,ZESTRIL) 20 MG tablet Take 20 mg by mouth daily.        . meloxicam (MOBIC) 7.5 MG tablet Take 1 to 2 tablets by mouth for pain.      . metroNIDAZOLE (FLAGYL) 500 MG tablet Take 500 mg by mouth 2 (two) times daily. Take for 7 days       . polyethylene glycol (GLYCOLAX) packet Take 17 g by mouth daily. Mix into 8 ounces of fluid. Use daily for 2 weeks       . SUMAtriptan (IMITREX) 25 MG tablet Take 25 mg by mouth once. May repeat once in 2 hrs       . topiramate (TOPAMAX) 25 MG tablet TAKE 1 TABLET TWICE DAILY  60 tablet  2  . valACYclovir (VALTREX) 500 MG tablet Take 500 mg by mouth daily.        Marland Kitchen zolpidem (AMBIEN) 5 MG tablet Take 5 mg by mouth at bedtime as needed.         Review Of Systems:  Per HPI    Physical Exam: Pulse: 86  Blood Pressure: 129/82 RR: 17   O2: 99% on RA Temp: 98.1   General: alert and cooperative HEENT: extra ocular movement intact Heart:  S1, S2 normal, no murmur, rub or gallop, regular rate and rhythm Lungs: clear to auscultation, no wheezes or rales and unlabored breathing Abdomen: abdomen is soft without significant tenderness, masses, organomegaly or guarding Extremities: extremities normal, atraumatic, no cyanosis or edema Neurology: nonfocal  Labs and Imaging: Lab Results  Component Value Date/Time   NA 139 03/21/2011  5:00 AM   K 3.9 03/21/2011  5:00 AM   CL 103 03/21/2011  5:00 AM   CO2 25 01/09/2011  8:08 PM   BUN 6 03/21/2011  5:00 AM   CREATININE 0.7 03/21/2011  5:00 AM   GLUCOSE 105* 03/21/2011  5:00 AM   Lab Results  Component Value Date   WBC 9.9 01/13/2011   HGB 12.9 03/21/2011   HCT 38.0 03/21/2011   MCV 92.5 01/13/2011   PLT 334 01/13/2011     Assessment and Plan: Jessica [name] is a 50 y.o. year old female presenting with angioedema. 1. Angioedema: 2/2 to  ACEi. Pt has lower lip swallowing but her airway is not compromised.  Her sat is 99% on RA and we observed pt drinking water in the ED.  Since she is stable and her respiratory status is stable, we feel comfortable sending her home with close follow-up at [facility].  We discussed with the patient that edema may worsen and may last 24-48 hours from the last ingestion of ACEi.  The case was discussed with  Attending who agrees to the assessment and plan.  2. HTN: will not continue Lisinopril.  We will monitor her BP on outpatient basis and will start a new antihyperstensive in its place.   3.  Disposition: discharge home from ED with follow-up today at [facility].

## 2011-03-27 NOTE — Assessment & Plan Note (Signed)
Summary: orthotics,mc   Primary Care Provider:  Sarah Swaziland MD   History of Present Illness: B foot pain, here for orthotics   Allergies: No Known Drug Allergies  Physical Exam  General:  alert, well-developed, well-nourished, well-hydrated, and overweight-appearing.   Msk:  PES Planus B. GAIT--waddling gait (with circumduction compensatory for large thighs)  Additional Exam:  Patient was fitted for a : standard, cushioned, semi-rigid orthotic. The orthotic was heated and afterward the patient stood on the orthotic blank positioned on the orthotic stand. The patient was positioned in subtalar neutral position and 10 degrees of ankle dorsiflexion in a weight bearing stance. After completion of molding, a stable base was applied to the orthotic blank. The blank was ground to a stable position for weight bearing. 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 Take one by mouth bid 2)  Imitrex 25 Mg Tabs (Sumatriptan succinate) .... Take one pill at the onset of migraine, may repeat x1 in 2 hrs if not better 3)  Lisinopril 20 Mg Tabs (Lisinopril) .... Take one by mouth daily 4)  Ambien 5 Mg Tabs (Zolpidem tartrate) .Marland Kitchen.. 1 tab by mouth at bedtime as needed sleep 5)  Valtrex 500 Mg Tabs (Valacyclovir hcl) .Marland Kitchen.. 1 tab by mouth daily 6)  [facility]  .... Put on in am and take off in pm diagnosis:venous insufficiency and le edema 7)  Flexeril 10 Mg Tabs (Cyclobenzaprine hcl) .Marland Kitchen.. 1 by mouth bid as needed back pain 8)  Colace 100 Mg Caps (Docusate sodium) .... Take one by mouth bid 9)  Miralax Powd (Polyethylene glycol 3350) .Marland Kitchen.. 17g in 8 oz water dissolved daily x 2 wks 10)  Wellbutrin Xl 300 Mg Xr24h-tab (Bupropion hcl) .... Take 1  by mouth daily 11)  Hydrocodone-acetaminophen 5-500 Mg Tabs (Hydrocodone-acetaminophen) .Marland Kitchen.. 1 by mouth two times a day as needed for severe pain.  do not use with tylenol or acetaminophen 12)  Boric Acid Gran (Boric acid) .Marland Kitchen.. 1 capsule per vagina 2-3 times weekly 13)  Mobic 7.5 Mg Tabs (Meloxicam) .... One to two tabs by mouth daily for pain. 14)  Metronidazole 500 Mg Tabs (Metronidazole) .Marland Kitchen.. 1 by mouth two times a day for 7 days   Orders Added: 1)  Est. Patient Level IV [84696] 2)  Or[employer], each unit [L3002]

## 2011-04-02 LAB — GC/CHLAMYDIA PROBE AMP, GENITAL: Chlamydia, DNA Probe: NEGATIVE

## 2011-04-02 LAB — URINALYSIS, ROUTINE W REFLEX MICROSCOPIC
Bilirubin Urine: NEGATIVE
Ketones, ur: NEGATIVE mg/dL
Nitrite: NEGATIVE
Urobilinogen, UA: 0.2 mg/dL (ref 0.0–1.0)
pH: 6 (ref 5.0–8.0)

## 2011-04-02 LAB — WET PREP, GENITAL: Clue Cells Wet Prep HPF POC: NONE SEEN

## 2011-04-02 LAB — POCT PREGNANCY, URINE: Preg Test, Ur: NEGATIVE

## 2011-04-03 ENCOUNTER — Other Ambulatory Visit: Payer: Self-pay | Admitting: Family Medicine

## 2011-04-03 LAB — WET PREP, GENITAL
Clue Cells Wet Prep HPF POC: NONE SEEN
Trich, Wet Prep: NONE SEEN
Yeast Wet Prep HPF POC: NONE SEEN

## 2011-04-03 LAB — URINALYSIS, ROUTINE W REFLEX MICROSCOPIC
Bilirubin Urine: NEGATIVE
Nitrite: NEGATIVE
Specific Gravity, Urine: 1.027 (ref 1.005–1.030)
Urobilinogen, UA: 1 mg/dL (ref 0.0–1.0)
pH: 6.5 (ref 5.0–8.0)

## 2011-04-03 LAB — GC/CHLAMYDIA PROBE AMP, GENITAL: Chlamydia, DNA Probe: NEGATIVE

## 2011-04-04 NOTE — Telephone Encounter (Signed)
Refill request

## 2011-04-07 LAB — URINALYSIS, ROUTINE W REFLEX MICROSCOPIC
Glucose, UA: NEGATIVE mg/dL
Ketones, ur: NEGATIVE mg/dL
Nitrite: NEGATIVE
pH: 5.5 (ref 5.0–8.0)

## 2011-04-07 LAB — POCT I-STAT, CHEM 8
BUN: 6 mg/dL (ref 6–23)
Calcium, Ion: 1.12 mmol/L (ref 1.12–1.32)
Chloride: 107 mEq/L (ref 96–112)
HCT: 38 % (ref 36.0–46.0)
Potassium: 3.4 mEq/L — ABNORMAL LOW (ref 3.5–5.1)
Sodium: 139 mEq/L (ref 135–145)

## 2011-04-07 LAB — WET PREP, GENITAL: Yeast Wet Prep HPF POC: NONE SEEN

## 2011-04-07 LAB — RPR: RPR Ser Ql: NONREACTIVE

## 2011-04-07 LAB — GC/CHLAMYDIA PROBE AMP, GENITAL: Chlamydia, DNA Probe: NEGATIVE

## 2011-04-10 ENCOUNTER — Telehealth: Payer: Self-pay | Admitting: *Deleted

## 2011-04-10 ENCOUNTER — Encounter: Payer: Self-pay | Admitting: Family Medicine

## 2011-04-10 ENCOUNTER — Other Ambulatory Visit: Payer: Self-pay | Admitting: Family Medicine

## 2011-04-10 ENCOUNTER — Ambulatory Visit (INDEPENDENT_AMBULATORY_CARE_PROVIDER_SITE_OTHER): Payer: Medicaid Other | Admitting: Family Medicine

## 2011-04-10 VITALS — BP 143/92 | HR 86 | Temp 97.9°F | Ht 60.6 in | Wt 274.0 lb

## 2011-04-10 DIAGNOSIS — I10 Essential (primary) hypertension: Secondary | ICD-10-CM

## 2011-04-10 DIAGNOSIS — J309 Allergic rhinitis, unspecified: Secondary | ICD-10-CM | POA: Insufficient documentation

## 2011-04-10 DIAGNOSIS — R7309 Other abnormal glucose: Secondary | ICD-10-CM

## 2011-04-10 DIAGNOSIS — R7303 Prediabetes: Secondary | ICD-10-CM | POA: Insufficient documentation

## 2011-04-10 DIAGNOSIS — E669 Obesity, unspecified: Secondary | ICD-10-CM

## 2011-04-10 DIAGNOSIS — E119 Type 2 diabetes mellitus without complications: Secondary | ICD-10-CM

## 2011-04-10 LAB — BASIC METABOLIC PANEL
BUN: 10 mg/dL (ref 6–23)
Chloride: 104 mEq/L (ref 96–112)
Creat: 0.7 mg/dL (ref 0.40–1.20)
Glucose, Bld: 90 mg/dL (ref 70–99)
Potassium: 4 mEq/L (ref 3.5–5.3)

## 2011-04-10 LAB — URINALYSIS, ROUTINE W REFLEX MICROSCOPIC
Bilirubin Urine: NEGATIVE
Glucose, UA: NEGATIVE mg/dL
Ketones, ur: NEGATIVE mg/dL
Protein, ur: NEGATIVE mg/dL
pH: 5.5 (ref 5.0–8.0)

## 2011-04-10 LAB — URINE MICROSCOPIC-ADD ON

## 2011-04-10 LAB — POCT PREGNANCY, URINE: Preg Test, Ur: NEGATIVE

## 2011-04-10 LAB — WET PREP, GENITAL

## 2011-04-10 LAB — GC/CHLAMYDIA PROBE AMP, GENITAL
Chlamydia, DNA Probe: NEGATIVE
GC Probe Amp, Genital: NEGATIVE

## 2011-04-10 MED ORDER — POLYETHYLENE GLYCOL 3350 17 G PO PACK: 17.0000 g | PACK | Freq: Every day | ORAL | Status: DC

## 2011-04-10 MED ORDER — FLUTICASONE FUROATE 27.5 MCG/SPRAY NA SUSP: 2.0000 | Freq: Every day | NASAL | Status: DC

## 2011-04-10 MED ORDER — FLUTICASONE PROPIONATE 50 MCG/ACT NA SUSP: 2.0000 | Freq: Every day | NASAL | Status: DC

## 2011-04-10 MED ORDER — MOMETASONE FUROATE 50 MCG/ACT NA SUSP: 2.0000 | Freq: Every day | NASAL | Status: DC

## 2011-04-10 NOTE — Assessment & Plan Note (Signed)
Pt without relief with Claritin,so will add nasal steroid.  Reviewed side effects with pt.

## 2011-04-10 NOTE — Assessment & Plan Note (Signed)
Pt currently exercising and trying to eat healthy foods, avoiding breads and sweets, notices clothes are looser, but not losing pounds. Reviewed healthy lifestyle.  Pt also interested in bariatric surgery, needs forMedicaid to cover.  Will check TSH today.  Will investigate surgery options.

## 2011-04-10 NOTE — Telephone Encounter (Signed)
I can fill it out when I return to the office next week.  Is there another medicine that we could switch to?

## 2011-04-10 NOTE — Assessment & Plan Note (Signed)
Reviewed lifestyle changes.  Weight loss would certainly help.  Pt considering surgery.

## 2011-04-10 NOTE — Telephone Encounter (Addendum)
Dr. Deirdre Priest had sent in rx for  fluticasone veramyst  and this also requires PA . Called pharmacy and was told Flonase is the fluticasone that medicaid will approve. Dr. Deirdre Priest advises to switch to that with same directions.. Pharmacy notified. Rx called in .

## 2011-04-10 NOTE — Telephone Encounter (Signed)
PA  required for  nasonex . Form placed in MD box.

## 2011-04-10 NOTE — Assessment & Plan Note (Signed)
Pt with h/o needing meds.  Will restart today with HCTZ 25.  Follow up 1 month and check BMP at that time.  With futher lifestyle changes and weight loss, may be able to decrease meds.

## 2011-04-10 NOTE — Progress Notes (Signed)
  Subjective:    Patient ID: Jessica [name], female    DOB: [birth year], 50 y.o.   MRN: [id number]  HPI 50 yo female here for follow up of several issues:  Elevated blood pressure:  Pt with BP of 140s/90s today.  She is concerned about this and has been on meds in the past, stopping ACE due to angioedema.  She would like to consider restarting meds.   FH diabetes: Would like sugar tested.  +polyuria +nocturia.  Denies thirst, but drinks lots of water.  No unintentional weight loss.    Allergies:  Has noted sneezing, runny nose, red eyes and coughing, especially in am.  On Claritin for a few weeks without relief.  Denies fever.     Vaginal d/c: Using boric acid with relief.  Uses before menses and also other times prn.  HSV: Taking valacyclovir without problems.    Review of Systems  See HPI     Objective:   Physical Exam    Obese, no distress.  Vitals noted. HEENT: Conjunctiva clear without discharge or injection.  TMs clear B and canals patent.  Oropharynx clear without erythema or exudate.  Neck supple without LAD.  Turbinates slightly inflammed without nasal discharge. +acanthosis nigricans Lungs: CTA B CV: RRR no m/g/r Skin: 2 areas of hyperpigmentation on dorsal surface of L foot,approx 1.5 cm by 1 cm.  No surrounding erythema or streaking.    Assessment & Plan:

## 2011-04-11 ENCOUNTER — Telehealth: Payer: Self-pay | Admitting: Family Medicine

## 2011-04-11 ENCOUNTER — Other Ambulatory Visit: Payer: Self-pay | Admitting: Family Medicine

## 2011-04-11 MED ORDER — HYDROCHLOROTHIAZIDE 25 MG PO TABS: 25.0000 mg | ORAL_TABLET | Freq: Every day | ORAL | Status: DC

## 2011-04-11 NOTE — Telephone Encounter (Signed)
Patient notified that MD will send in now.

## 2011-04-11 NOTE — Telephone Encounter (Signed)
States that Dr Swaziland was going to "restart today with HCTZ 25" - meds did not get called in and she is going out of town at lunch time today.  Needs it called to [facility] [address]

## 2011-04-12 NOTE — Consult Note (Signed)
Jessica [name], MOTTRAM               ACCOUNT NO.:  [id number]  MEDICAL RECORD NO.:  [id number]           PATIENT TYPE:  E  LOCATION:  [facility]                         FACILITY:  [facility]  PHYSICIAN:  Dmani Mizer A. Sheffield Slider, M.D.    DATE OF BIRTH:  1961/11/24  DATE OF CONSULTATION:  03/21/2011 DATE OF DISCHARGE:  03/21/2011                                CONSULTATION   PRIMARY CARE PHYSICIAN:  Sarah Swaziland, MD at the [facility].  CHIEF COMPLAINT:  Swollen lip.  HISTORY OF PRESENT ILLNESS:  Ms. Jessica [name] is a 50 year old female with a past medical history of hypertension who presented to the ED with lower lip swelling.  The patient has been taking lisinopril for approximately 1 year for hypertension.  Her symptoms began some time overnight while she was sleeping.  The patient woke up with edema to the right lower lip.  She came to the ED with concern for lip swelling.  She denies difficulty breathing, shortness of breath, difficulty swallowing, chest pain, palpitations.  Her speech is intact.  In the ED, she was given Pepcid, Solu-Medrol, and Benadryl.  Her last dose of lisinopril was taken on March 20, 2011, around lunch time.  PAST MEDICAL HISTORY: 1. Chronic anal fissures. 2. Obesity. 3. Hypertension. 4. Depression. 5. Migraine headaches.  PAST SURGICAL HISTORY: 1. Cervical disk surgery in 2001. 2. Cholecystectomy. 3. C-section x3 with the last one in 1992. 4. Tubal ligation in 1992.  SOCIAL HISTORY: 1. She is a single mom of 3 children who are ages 53, 21, and 55.  Her     29 year old child is in college.  She is active in her church. 2. No smoking.  Usually have a glass of wine on holidays.  No other     substance use. 3. Disability for neck injury, but works part-time.  FAMILY HISTORY: 1. Asthma. 2. Obesity. 3. Mother with bilateral leg swelling from poor circulation. 4. Diabetes.  ALLERGIES:  No known drug allergies.  CURRENT MEDICATIONS: 1. Boric acid  granules. 2. Bupropion XL 300 mg p.o. daily. 3. Flexeril 10 mg tablets p.o. b.i.d. as needed for back spasm. 4. Colace 100 mg b.i.d. 5. Hydrocodone 5/500 one tablet b.i.d. as needed for severe pain. 6. Lisinopril 20 mg p.o. daily. 7. Mobic 7.5 mg 1-2 tablets by mouth for pain. 8. Imitrex 25 mg as needed for headache. 9. Topamax 25 mg b.i.d. 10.Valtrex 500 mg p.o. daily. 11.Ambien 5 mg p.o. at bedtime.  REVIEW OF SYSTEMS:  Per HPI.  PHYSICAL EXAMINATION:  VITAL SIGNS:  Pulse 86, blood pressure 129/82, respiratory rate 17, O2 sats 99% on room air, temperature 98.1. GENERAL:  Alert, cooperative, no apparent distress. HEENT:  Extraocular motility is intact. CARDIOVASCULAR:  Regular rate and rhythm.  No murmurs, rubs, or gallops. S1 and S2 normal. LUNGS:  Clear to auscultation bilaterally.  No wheezing, rales, or rhonchi. ABDOMEN:  Soft without significant tenderness, masses, organomegaly, or guarding. EXTREMITIES:  Extremities normal.  Atraumatic.  No cyanosis or edema. NEUROLOGIC:  Nonfocal.  LABORATORY DATA:  BMET with sodium 139, potassium 3.9, chloride 103, CO2 of 25, BUN 6, creatinine  0.7, glucose 105.  ASSESSMENT/PLAN:  This is a 50 year old female with a past medical history of hypertension treated with lisinopril who presents for angioedema in the right lower lip. 1. Angioedema secondary to ACE inhibitor.  The patient has lower lip     swelling, but her airway is intact and respiratory status is fine     compromised.  Her O2 saturation is 99% on room air and we observed     the patient drinking water in the emergency room.  She was able to     swallow water without any complications.  Since the patient is     stable and her respiratory status is stable we feel comfortable     sending her home with close follow up at the [facility].     We discussed with the patient that edema may worsen and may last     for 24-48 hours from the last injection of ACE inhibitor.   The case     was discussed with attending who agreed on the assessment and plan. 2. Hypertension.  We will not continue lisinopril at this time.  We     will monitor blood pressure on an outpatient     basis and we will start a new medication for hypertension. 3. Disposition.  This patient was discharged home from the emergency     room with followup today at the [facility].     Cat Ta, MD   ______________________________ Arnette Norris Sheffield Slider, M.D.    CT/MEDQ  D:  03/21/2011  T:  03/22/2011  Job:  [id number]  Electronically Signed by CAT TA MD on 04/08/2011 11:09:53 AM Electronically Signed by Zachery Dauer M.D. on 04/12/2011 07:14:59 PM

## 2011-04-14 ENCOUNTER — Encounter: Payer: Self-pay | Admitting: Family Medicine

## 2011-04-21 ENCOUNTER — Telehealth: Payer: Self-pay | Admitting: Family Medicine

## 2011-04-21 NOTE — Telephone Encounter (Signed)
I spoke with [facility].  They do take Medicaid, and they will mail a packet of information out to her.  She can review it and fill it out if she would like to learn more.

## 2011-04-21 NOTE — Telephone Encounter (Signed)
Pt is calling to follow up with MD re: lapband surgery, says MD was checking into it for pt.

## 2011-05-13 NOTE — Assessment & Plan Note (Signed)
NAMEKATINA, REMICK NO.:  [id number]   MEDICAL RECORD NO.:  [id number]          PATIENT TYPE:  POB   LOCATION:  [facility] at [facility]         FACILITY:  [facility]   PHYSICIAN:  Argentina Donovan, MD        DATE OF BIRTH:  1961-05-27   DATE OF SERVICE:  01/13/2011                                  CLINIC NOTE   The patient is a 50 year old, morbidly obese white female in whom an IUD  was placed in September.  She was here in October and seemed to be fine,  starting in December into January, she started having severe low  abdominal cramping. Her fiance said he could feel something when he had  sex, it was bothering him and she wanted it her removed.  She also has  recurrent BV and has difficulty taking metronidazole, examined her, we  removed the IUD without incident.  It seemed to be in place.  Wet prep  was taken, but I am certain because of positive whiff test that the  patient has BV.  I am going to try her on doxycycline as a treatment.  She already has suppositories of boric acid, difficulty to use when she  has not been having periods on a regular basis.  She had the IUD placed  because she has said to have heavy periods.  We are going to let her  start having periods again and then I think we will place her on  Loestrin to see whether or not we can control the bleeding that way.   The impression is painful cramping secondary to an IUD, recurrent BV,  treat doxycycline.  Consider oral contraception to control bleeding.           ______________________________  Argentina Donovan, MD     PR/MEDQ  D:  01/13/2011  T:  01/14/2011  Job:  [id number]

## 2011-05-13 NOTE — Assessment & Plan Note (Signed)
Jessica [name], RENO NO.:  [id number]   MEDICAL RECORD NO.:  [id number]          PATIENT TYPE:  POB   LOCATION:  [facility] at [facility]         FACILITY:  [facility]   PHYSICIAN:  Tinnie Gens, MD        DATE OF BIRTH:  Apr 22, 1961   DATE OF SERVICE:  09/17/2010                                  CLINIC NOTE   CHIEF COMPLAINT:  IUD removal and replacement.   HISTORY OF PRESENT ILLNESS:  The patient is a 50 year old G4, P3-0-1-3,  who was seen in the [facility].  She has had her IUD in for  greater than 5 years.  They tried to remove it at the [facility], however, they could not find her strings.  She needs this to be  removed and would like another one reinserted.  Otherwise, she is  without significant complaints.   PAST MEDICAL HISTORY:  Significant for high blood pressure.   PAST SURGICAL HISTORY:  She had a neck fusion.   MEDICATIONS:  She is on lisinopril, Valtrex, and Ambien as needed.   ALLERGIES:  None known.   SOCIAL HISTORY:  She is a single, lives with her children.  She does not  smoke, use drugs, or use alcohol.   FAMILY HISTORY:  Significant for diabetes in her parents, high blood  pressure in her parents.   OB HISTORY:  She is a G4, P3-0-1-3 and she has had 3 previous C-  sections.   GYN HISTORY:  Significant for no history of abnormal Pap smear.  No  history of STDs.  She uses IUD for birth control.   A 14-point review of systems reviewed.  Please see GYN history in the  chart.  Significant for weight gain, hot flashes, headache, easy  bruising, and abdominal pain.  These areas were addressed by her primary  care physician at the [facility].   PHYSICAL EXAMINATION:  VITAL SIGNS:  Today, her vitals are as noted in  the chart.  GENERAL:  She is an obese female, in no acute distress.  ABDOMEN:  Soft, nontender, nondistended.  GU:  Normal external female genitalia.  BUS is normal.  Vagina is pink  and rugated.   Cervix is nulliparous without lesion.  Uterus is  anteverted and difficult to outline size or shape or adnexa secondary to  body habitus.   PROCEDURE:  Cervix was visualized, grasped with single-tooth tenaculum,  and a straight clamp is used to go up inside the uterus until the IUD  was grasped and removed.  The patient tolerated this well.  The second  procedure included Betadine to the cervix x2.  The uterus was sounded to  10 cm and the Mirena IUD was placed without difficulty.  Strings were  trimmed to 3 cm.  The patient tolerated the procedure well.   IMPRESSION:  Status post intrauterine device removal with reinsertion  for cycle control and birth control.   PLAN:  Follow up in 4 weeks for IUD string check.           ______________________________  Tinnie Gens, MD     TP/MEDQ  D:  09/17/2010  T:  09/18/2010  Job:  [id number]

## 2011-05-15 ENCOUNTER — Ambulatory Visit (INDEPENDENT_AMBULATORY_CARE_PROVIDER_SITE_OTHER): Payer: Medicaid Other | Admitting: Family Medicine

## 2011-05-15 ENCOUNTER — Other Ambulatory Visit: Payer: Self-pay | Admitting: Family Medicine

## 2011-05-15 ENCOUNTER — Encounter: Payer: Self-pay | Admitting: Family Medicine

## 2011-05-15 VITALS — BP 137/86 | HR 89 | Temp 98.1°F | Ht 61.0 in | Wt 276.0 lb

## 2011-05-15 DIAGNOSIS — N760 Acute vaginitis: Secondary | ICD-10-CM

## 2011-05-15 LAB — POCT WET PREP (WET MOUNT)
Trichomonas Wet Prep HPF POC: NEGATIVE
Yeast Wet Prep HPF POC: NEGATIVE

## 2011-05-15 MED ORDER — FLUCONAZOLE 150 MG PO TABS: 150.0000 mg | ORAL_TABLET | Freq: Once | ORAL | Status: AC

## 2011-05-15 NOTE — Telephone Encounter (Signed)
Spoke with patient on the phone. I informed her that her wet mount was negative for yeast or clue cells. However, based on her discharge itself, we will treat her with one dose of diflucan.

## 2011-05-15 NOTE — Progress Notes (Signed)
Addended by: Swaziland, Celsey Asselin on: 05/15/2011 11:52 AM   Modules accepted: Orders

## 2011-05-15 NOTE — Progress Notes (Signed)
  Subjective:    Patient ID: Jessica [name], female    DOB: 05/21/1961, 50 y.o.   MRN: [id number]  Vaginal Discharge The patient's primary symptoms include genital itching, pelvic pain and a vaginal discharge. The patient's pertinent negatives include no genital lesions, genital odor, genital rash, missed menses or vaginal bleeding. This is a chronic problem. The current episode started more than 1 year ago. The problem occurs constantly. The problem has been unchanged. The pain is mild. She is not pregnant. Associated symptoms include abdominal pain. Pertinent negatives include no anorexia, back pain, chills, constipation, diarrhea, discolored urine, dysuria, fever, flank pain, frequency, headaches, hematuria, joint pain, joint swelling, painful intercourse, rash, urgency or vomiting. The vaginal discharge was milky, thick and white. There has been no bleeding. She has not been passing clots. She has not been passing tissue. The symptoms are aggravated by nothing. She has tried antifungals and antibiotics (boric acid) for the symptoms. The treatment provided no relief. She is sexually active.  Abdominal Pain Pertinent negatives include no anorexia, constipation, diarrhea, dysuria, fever, frequency, headaches, hematuria or vomiting.      Review of Systems  Constitutional: Negative for fever and chills.  Gastrointestinal: Positive for abdominal pain. Negative for vomiting, diarrhea, constipation and anorexia.  Genitourinary: Positive for vaginal discharge and pelvic pain. Negative for dysuria, urgency, frequency, hematuria, flank pain and missed menses.  Musculoskeletal: Negative for back pain and joint pain.  Skin: Negative for rash.  Neurological: Negative for headaches.  All other systems reviewed and are negative.       Objective:   Physical Exam  Genitourinary: There is no rash, tenderness, lesion or injury on the right labia. There is no rash, tenderness, lesion or injury on the left  labia. No erythema, tenderness or bleeding around the vagina. No foreign body around the vagina. No signs of injury around the vagina. Vaginal discharge found.          Assessment & Plan:  Patient with chronic white vaginal discharge. 1. Yeast Infection most likely - will wait for wet mount results and prescribe diflucan 150mg  QD if it is positive 2. Bacterial vaginosis - less likely based on the consistency of the discharge and the symptoms. She doesn't tolerate Flagyl very well, I will use Clindamycin if this is positive. 3. GC/Chl samples sent, the collection was from discharge, not directly from the cervix

## 2011-05-16 LAB — GC/CHLAMYDIA PROBE AMP, GENITAL: Chlamydia, DNA Probe: NEGATIVE

## 2011-05-16 NOTE — Assessment & Plan Note (Signed)
[facility]                            CARDIOLOGY OFFICE NOTE   Jessica [name], Jessica [name]                      MRN:          [id number]  DATE:04/06/2007                            DOB:          September 19, 1961    REFERRING PHYSICIAN:  Alanson Puls, M.D.   REASON FOR PRESENTATION:  Evaluate patient with chest pain.   HISTORY OF PRESENT ILLNESS:  Patient is a pleasant 50 year old African-  American female.  She has not had a prior cardiac history.  She has had,  however, over the past two months, chest discomfort.  This feels like  someone punching her chest.  It does happen with activity such as  household chores or walking.  It is not always reproducible.  It does  not happen at rest.  When she gets it, it lingers slightly for a few  minutes.  It can be a 7-10 in intensity.  With this, she might have  sweats.  It has been a stable pattern, not increasing in frequency or  duration.  She has had no radiation to her jaw or to her arms.  She  thinks she has been breathing heavier but does not have associated  with her chest pain.  She does have an echocardiogram that was done  today, but the results are pending.  This has been in the hospital.  She  has a BNP.  She also plans to have a sleep study.  Patient has had no  palpitations, presyncope, or syncope.  She has had no PND or orthopnea.   PAST MEDICAL HISTORY:  Hypertension x1 year.   PAST SURGICAL HISTORY:  A cervical neck fusion in 1997.   ALLERGIES:  None.   CURRENT MEDICATIONS:  1. Celexa 20 mg daily.  2. Hydrochlorothiazide 25 mg daily.   SOCIAL HISTORY:  Patient is a Designer, industrial/product.  She is divorced.  She has three  children, ages 31, 48, and 37.  She does not smoke cigarettes.  She  drinks alcohol socially.   FAMILY HISTORY:  Contributory with her mother dying with complications  of diabetes and apparent congestive heart failure at age 72.  She  reports having a brother with a myocardial infarction  at age 48.   REVIEW OF SYSTEMS:  Positive for headaches, occasional cough and sputum.  Negative for other systems.   PHYSICAL EXAMINATION:  GENERAL:  Patient is in no distress.  VITAL SIGNS:  Blood pressure 102/70, heart rate 83 and regular, Body  Mass Index 56, weight 300 pounds.  HEENT:  Eyelids unremarkable.  Pupils are equal, round and reactive to  light.  Fundi within normal limits.  Oral mucosa unremarkable.  NECK:  No jugular venous distention at 45 degrees.  Carotid upstroke  brisk and symmetric.  No bruits, no thyromegaly.  LYMPHATICS:  No cervical, axillary, or inguinal adenopathy.  LUNGS:  Clear to auscultation bilaterally.  BACK:  No costovertebral angle tenderness.  CHEST:  Unremarkable.  HEART:  PMI not displaced or sustained.  S1 and S2 within normal limits.  No S3, no S4, no clicks, rubs, or murmurs.  ABDOMEN:  Morbidly obese.  Positive bowel sounds.  Normal in frequency  and pitch.  No bruits, rebound, guarding.  There are no midline  pulsatile masses.  No hepatomegaly, splenomegaly.  SKIN:  No rashes, no nodules.  EXTREMITIES:  Pulses 2+ throughout.  No cyanosis, no clubbing, no edema.  NEUROLOGIC:  Alert and oriented to person, place, and time.  Cranial  nerves II-XII grossly intact.  Motor grossly intact throughout.   EKG (done in late March), sinus rhythm, rate 65, axis within normal  limits, anterior T wave inversions, V1-2, nonspecific.   ASSESSMENT/PLAN:  1. Patient's chest discomfort is somewhat atypical; however, she has a      slightly abnormal EKG.  In addition, she does have hypertension as      a risk factor and most importantly, a very strong family history.      Some of her discomfort seems to come on with exertion.  Given this,      I think it is prudent to screen her with a stress perfusion study,      as there is a moderate pre-test probability of obstructive coronary      disease.  I have discussed this with her.  She could walk on a       treadmill.  She will need a two day study because of her size.  She      will have an exercise Cardiolite.  2. Hypertension:  Blood pressure is well controlled and is to be      managed by her primary care doctor.  3. Obesity:  We had a discussion about this, and I recommended the      [facility] Diet.  4. Dyspnea:  This may be multifactorial.  I will look for the results      of the echocardiogram as well as the BNP level that were done.  5. Followup:  Will be based on the results of the above.     Rollene Rotunda, MD, [facility]  Electronically Signed    JH/MedQ  DD: 04/06/2007  DT: 04/07/2007  Job #: [id number]   cc:   Alanson Puls, M.D.

## 2011-05-16 NOTE — Discharge Summary (Signed)
[facility]  Patient:    MAX, ROMANO Visit Number: [id number] MRN: [id number]          Service Type: EMS Location: [facility] Attending Physician:  Lorre Nick Dictated by:   Jeanice Lim, M.D. Admit Date:  05/03/2002 Discharge Date: 05/03/2002                             Discharge Summary  IDENTIFYING DATA:  This is a 50 year old African-American female married, voluntarily admitted for a history of depression.  Off of medications for one year.  Status post recent multiple stressors.  Overdose prior to admission.  MEDICATIONS:  Flexeril, Xenical, Naprosyn.  ALLERGIES:  No known drug allergies.  PHYSICAL EXAMINATION:  Within normal limits.  Neurologically nonfocal.  LABORATORY DATA:  Unremarkable.  MENTAL STATUS EXAMINATION:  Well-groomed, markedly obese African-American female.  Fully alert and in no acute distress.  Cooperative, calm.  Speech within normal limits.  Mood depressed.  Frustrated with being in the hospital. Disappointed.  Thought process goal directed.  Positive suicidal ideation without intent, contracting for safety.  No psychotic symptoms.  Cognitively intact.  Judgment and insight limited.  ADMISSION DIAGNOSES: Axis I:    Major depression, recurrent, severe. Axis II:   None. Axis III:  1. Obesity.            2. Right arm pain. Axis IV:   Moderate (problems with primary support and economic problems). Axis V:    34/70.  HOSPITAL COURSE:  The patient was admitted and ordered routine p.r.n. medications.  Restarted on Lexapro, Flexeril, Naprosyn, Ambien.  Family meeting was held to discuss aftercare planning.  The patient tolerated medication changes, showed no dangerous behavior or ideation.  CONDITION ON DISCHARGE:  Improved.  Mood was more euthymic.  Affect bright. Thought processes goal directed.  Thought content negative for dangerous ideation or psychotic symptoms.  DISCHARGE MEDICATIONS: 1. Lexapro 10 mg  q.a.m. 2. Flexeril 10 mg q.a.m. 3. Naprosyn 500 mg b.i.d. 4. Xenical 120 mg t.i.d. 5. Ambien 10 mg q.h.s. p.r.n.  FOLLOW-UP:  Intensive outpatient program at 9 a.m. on Monday, March 21, 2002 and then with Netta Cedars, M.D. and a therapist at the [facility].  DISCHARGE DIAGNOSES: Axis I:    Major depression, recurrent, severe. Axis II:   None. Axis III:  1. Obesity.            2. Right arm pain. Axis IV:   Moderate (problems with primary support and economic problems). Axis V:    Global Assessment of Functioning on discharge 55. Dictated by:   Jeanice Lim, M.D. Attending Physician:  Lorre Nick DD:  05/11/02 TD:  05/13/02 Job: [id number]

## 2011-05-16 NOTE — Procedures (Signed)
NAMESHAWNY, BORKOWSKI               ACCOUNT NO.:  [id number]   MEDICAL RECORD NO.:  [id number]          PATIENT TYPE:  OUT   LOCATION:  SLEEP CENTER                 FACILITY:  [facility]   PHYSICIAN:  Clinton D. Maple Hudson, MD, FCCP, FACPDATE OF BIRTH:  09[birth year]   DATE OF STUDY:  05/04/2007                            NOCTURNAL POLYSOMNOGRAM   REFERRING PHYSICIAN:  Alanson Puls, M.D.   INDICATION FOR STUDY:  Hypersomnia with sleep apnea.   EPWORTH SLEEPINESS SCORE:  7/24.  Height 60.5 inches, weight 309 pounds.   MEDICATIONS:  Home medications listed and reviewed.   SLEEP ARCHITECTURE:  Total sleep time 348 minutes, with sleep efficiency  90%.  Stage I was 6%.  Stage II, 71%.  Stages III and IV, 1%.  REM 23%  of total sleep time.  Sleep latency 16 minutes.  REM latency 66 minutes.  Awake after sleep onset 23 minutes.  Arousal index 19.5.  Bedtime  medication include metoprolol and metronidazole at 9:30 p.m.   RESPIRATORY DATA:  Apnea-hypopnea index (AHI, RDI) 22.2 obstructive  events per hour, indicating moderate obstructive sleep apnea-hypopnea  syndrome.  This included 100 obstructive apneas, 1 mixed apnea, and 28  hypopneas.  Events were positional, with most sleep-end events during  the first half of the study being supine.  Events did not occur early  enough to permit CPAP titration by split protocol on the study night.   OXYGEN DATA:  Moderate snoring, with oxygen desaturation to a nadir of  89%.  Mean oxygen saturation through the study was 95% on room air.   CARDIAC DATA:  Normal sinus rhythm.   MOVEMENT/PARASOMNIA:  No significant movement-related sleep disturbance.   IMPRESSIONS/RECOMMENDATIONS:  1. Moderate obstructive sleep apnea/hypopnea syndrome, AHI 22.2 per      hour.  Events were most common while supine, as expected.  Moderate      snoring, with oxygen desaturation to a nadir of 89%.  2. Events began too late to permit CPAP titration by split protocol on      the  study night.  Consider return for CPAP titration or evaluate      for alternative therapies as appropriate.      Clinton D. Maple Hudson, MD, [facility], FACP  Diplomate, Biomedical engineer of Sleep Medicine  Electronically Signed     CDY/MEDQ  D:  05/09/2007 09:11:42  T:  05/10/2007 07:12:34  Job:  [id number]

## 2011-05-22 ENCOUNTER — Encounter: Payer: Self-pay | Admitting: Sports Medicine

## 2011-05-22 ENCOUNTER — Ambulatory Visit (INDEPENDENT_AMBULATORY_CARE_PROVIDER_SITE_OTHER): Payer: Medicaid Other | Admitting: Sports Medicine

## 2011-05-22 VITALS — BP 140/88 | HR 90 | Temp 98.4°F | Ht 61.0 in | Wt 278.0 lb

## 2011-05-22 DIAGNOSIS — M19079 Primary osteoarthritis, unspecified ankle and foot: Secondary | ICD-10-CM

## 2011-05-22 NOTE — Assessment & Plan Note (Signed)
Injected. Symptoms resolved. RTC prn.

## 2011-05-22 NOTE — Progress Notes (Signed)
  Subjective:    Patient ID: Jessica [name], female    DOB: November 04, 1961, 50 y.o.   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    Injected 01/13/11 pt with excellent results.  Lasted ~ 3 months.  Had orthotics made in March 2012.  Had no complications from previous 2 injections and is excited to get another injection.  She has tried all analgesics without improvement.  Review of Systems    See HPI Objective:   Physical Exam    Consent obtained and verified. Time-out conducted. Noted no overlying erythema, induration, or other signs of local infection. Sterile betadine prep. Furthur cleansed with alcohol. Topical analgesic spray: Ethyl chloride. Joint: Left 1st MTP joint Approached in typical fashion over dorsum of great toe with 30g needle, nurse applied axial traction to toe to widen joint space.  Needle slid easily into joint space and medication injected easily. Completed without difficulty Meds: 1/2 cc kenalog 40, 1 cc lidocaine 1% no epi. Immediate resolution of all symptoms. Advised to call if fevers/chills, erythema, induration, drainage, or persistent bleeding.   Initial Xray, note osteophytosis and DJD in 1st MTP joint. Assessment & Plan:

## 2011-06-06 ENCOUNTER — Encounter: Payer: Self-pay | Admitting: Family Medicine

## 2011-06-06 ENCOUNTER — Ambulatory Visit (INDEPENDENT_AMBULATORY_CARE_PROVIDER_SITE_OTHER): Payer: Medicaid Other | Admitting: Family Medicine

## 2011-06-06 VITALS — BP 165/103 | HR 61 | Temp 98.8°F | Wt 273.0 lb

## 2011-06-06 DIAGNOSIS — J069 Acute upper respiratory infection, unspecified: Secondary | ICD-10-CM

## 2011-06-06 DIAGNOSIS — Z20828 Contact with and (suspected) exposure to other viral communicable diseases: Secondary | ICD-10-CM

## 2011-06-06 NOTE — Patient Instructions (Signed)
Common Cold, Adult An upper respiratory tract infection, or cold, is a viral infection of the air passages to the lung. Colds are contagious, especially during the first 3 or 4 days. Antibiotics cannot cure a cold. Cold germs are spread by coughs, sneezes, and hand to hand contact. A respiratory tract infection usually clears up in a few days, but some people may be sick for a week or two. HOME CARE INSTRUCTIONS  Only take over-the-counter or prescription medicines for pain, discomfort, or fever as directed by your caregiver.   Be careful not to blow your nose too hard. This may cause a nosebleed.   Use a cool-mist humidifier (vaporizer) to increase air moisture. This will make it easier for you to breath. Do not use hot steam.   Rest as much as possible and get plenty of sleep.   Wash your hands often, especially after you blow your nose. Cover your mouth and nose with a tissue when you sneeze or cough.   Drink at least 8 glasses of clear liquids every day, such as water, fruit juices, tea, clear soups, and carbonated beverages.  SEEK MEDICAL CARE IF:  An oral temperature above 102.5 lasts 4 days or more, and is not controlled by medication.   You have a sore throat that gets worse or you see white or yellow spots in your throat.   Your cough gets worse or lasts more than 10 days.   You have a rash somewhere on your skin. You have large and tender lumps in your neck.   You have an earache or a headache.   You have thick, greenish or yellowish discharge from your nose.   You cough-up thick yellow, green, gray or bloody mucus (secretions).  SEEK IMMEDIATE MEDICAL CARE IF: You have trouble breathing, chest pain, or your skin or nails look gray or blue. MAKE SURE YOU:   Understand these instructions.   Will watch your condition.   Will get help right away if you are not doing well or get worse.  Document Released: 12/12/2000 Document Re-Released: 11/27/2008 [facility] Patient  Information 2011 [place], [place].

## 2011-06-07 NOTE — Progress Notes (Signed)
  Subjective:     Roselie L Eberwein is a 50 y.o. female who presents for evaluation of symptoms of a URI. Symptoms include congestion, headache described as mild, bitemporal, squeezing, intermittent, lasting < 4 hours without associated visual changes, photo/phonophbia or nausea, nasal congestion, no  fever, post nasal drip, productive cough with  clear colored sputum, sinus pressure, sneezing and sore throat. Onset of symptoms was 1 week ago, and has been gradually improving since that time. Treatment to date: antihistamines, cough suppressants, decongestants and nasal steroids.  The following portions of the patient's history were reviewed and updated as appropriate: allergies, current medications, past family history, past medical history, past social history, past surgical history and problem list.  Review of Systems Pertinent items are noted in HPI.   Objective:    BP 165/103  Pulse 61  Temp(Src) 98.8 F (37.1 C) (Oral)  Wt 273 lb (123.832 kg) General appearance: alert, cooperative and no distress Head: sinuses nontender to percussion Eyes: conjunctivae/corneas clear. PERRL, EOM's intact. Fundi benign. Ears: normal TM's and external ear canals both ears Nose: Nares normal. Septum midline. Mucosa normal. No drainage or sinus tenderness., mild congestion, no sinus tenderness Throat: lips, mucosa, and tongue normal; teeth and gums normal Neck: no adenopathy, no carotid bruit, no JVD, supple, symmetrical, trachea midline and thyroid not enlarged, symmetric, no tenderness/mass/nodules Lungs: clear to auscultation bilaterally Heart: regular rate and rhythm, S1, S2 normal, no murmur, click, rub or gallop Extremities: extremities normal, atraumatic, no cyanosis or edema   Assessment:    viral upper respiratory illness   Plan:    Discussed diagnosis and treatment of URI. Discussed the importance of avoiding unnecessary antibiotic therapy. Suggested symptomatic OTC remedies. Nasal saline  spray for congestion. Nasal steroids per orders. Follow up as needed.

## 2011-06-09 ENCOUNTER — Encounter: Payer: Self-pay | Admitting: Family Medicine

## 2011-06-10 ENCOUNTER — Other Ambulatory Visit: Payer: Self-pay | Admitting: Family Medicine

## 2011-06-10 DIAGNOSIS — Z1231 Encounter for screening mammogram for malignant neoplasm of breast: Secondary | ICD-10-CM

## 2011-06-10 NOTE — Telephone Encounter (Signed)
Refill request

## 2011-06-11 NOTE — Telephone Encounter (Signed)
In her chart, she has an allergy to the lisinopril, and it is not on her med list.  I don't think she should take that if she does have lip swelling with it, but her blood pressure was up at her last visit.  I did not refill the lisinopril, and she should probably come in for a a blood pressure check with me sometime within a month or so.  Thanks!

## 2011-06-12 ENCOUNTER — Other Ambulatory Visit: Payer: Self-pay | Admitting: Family Medicine

## 2011-06-12 MED ORDER — ZOLPIDEM TARTRATE 5 MG PO TABS: 5.0000 mg | ORAL_TABLET | Freq: Every evening | ORAL | Status: DC | PRN

## 2011-06-12 NOTE — Telephone Encounter (Signed)
Refill request

## 2011-06-12 NOTE — Telephone Encounter (Signed)
Pt with h/o migraine and hypertension, so will need to discuss with her.  I tried to call her, but no answer.  Refill request denied.

## 2011-06-26 ENCOUNTER — Ambulatory Visit (INDEPENDENT_AMBULATORY_CARE_PROVIDER_SITE_OTHER): Payer: Medicaid Other | Admitting: Family Medicine

## 2011-06-26 VITALS — BP 141/83 | HR 83 | Ht 61.0 in | Wt 274.0 lb

## 2011-06-26 DIAGNOSIS — B009 Herpesviral infection, unspecified: Secondary | ICD-10-CM

## 2011-06-26 DIAGNOSIS — B001 Herpesviral vesicular dermatitis: Secondary | ICD-10-CM

## 2011-06-26 MED ORDER — ACYCLOVIR 5 % EX OINT: TOPICAL_OINTMENT | CUTANEOUS | Status: DC

## 2011-06-26 NOTE — Patient Instructions (Signed)
Use the cream 5 times a day for the next few days If this does not improve incrase your valtrex to 1000 mg by mouth twice a day for  5 days

## 2011-06-26 NOTE — Progress Notes (Signed)
  Subjective:    Patient ID: Jessica [name], female    DOB: 03/07/1961, 50 y.o.   MRN: [id number]  HPI  Cold sore on left upper lip for approx 36 hours. Currently on valtrex for genital herpes suppression, she has had cold sores as a child She used her daughters cream a few years ago, and she would prefer this. No fever, no SOB, no tongue swelling  Review of Systems     Objective:   Physical Exam  GEN- NAD,overweight  HEENT- oropharynx clear, no oral lesions, MMM, Left upper lip- cold sore, TTP, blister seen, no surrounding erythema   SKIn - no rash on face    Assessment & Plan:   Herpes Labilis- will treat with topical acyclovir, if this does not help pt can increase her Valtrex to 1000mg  BID  As instructed

## 2011-06-27 ENCOUNTER — Telehealth: Payer: Self-pay | Admitting: Family Medicine

## 2011-06-27 NOTE — Telephone Encounter (Signed)
States that cream for her cold sore cost $800 and cannot afford that - needs something that medicaid will pay for. [facility] [employer]

## 2011-06-30 ENCOUNTER — Ambulatory Visit: Payer: Medicaid Other

## 2011-06-30 NOTE — Telephone Encounter (Signed)
Spoke with pt.  The lesion is not hurting much now, but it is dry and everytime she moves her lip or smiles, it cracks and bleeds.  She is applying H2O2 and vaseline to it.  I advised her to stop the H2O2 and just keep the area moist with the vaseline.  She will call us if there are any problems.

## 2011-07-20 ENCOUNTER — Emergency Department (HOSPITAL_COMMUNITY)
Admission: EM | Admit: 2011-07-20 | Discharge: 2011-07-20 | Disposition: A | Discharge status: Home / Self Care | Payer: Medicaid Other | Attending: Emergency Medicine | Admitting: Emergency Medicine

## 2011-07-20 DIAGNOSIS — Z79899 Other long term (current) drug therapy: Secondary | ICD-10-CM | POA: Insufficient documentation

## 2011-07-20 DIAGNOSIS — L02519 Cutaneous abscess of unspecified hand: Secondary | ICD-10-CM | POA: Insufficient documentation

## 2011-07-20 DIAGNOSIS — I10 Essential (primary) hypertension: Secondary | ICD-10-CM | POA: Insufficient documentation

## 2011-07-20 DIAGNOSIS — M79609 Pain in unspecified limb: Secondary | ICD-10-CM | POA: Insufficient documentation

## 2011-07-22 ENCOUNTER — Emergency Department (HOSPITAL_COMMUNITY)
Admission: EM | Admit: 2011-07-22 | Discharge: 2011-07-22 | Disposition: A | Discharge status: Home / Self Care | Payer: Medicaid Other | Attending: Emergency Medicine | Admitting: Emergency Medicine

## 2011-07-22 DIAGNOSIS — L089 Local infection of the skin and subcutaneous tissue, unspecified: Secondary | ICD-10-CM | POA: Insufficient documentation

## 2011-07-22 DIAGNOSIS — M79609 Pain in unspecified limb: Secondary | ICD-10-CM | POA: Insufficient documentation

## 2011-07-23 ENCOUNTER — Ambulatory Visit: Payer: Medicaid Other | Admitting: Family Medicine

## 2011-08-12 ENCOUNTER — Other Ambulatory Visit: Payer: Self-pay | Admitting: Family Medicine

## 2011-08-12 DIAGNOSIS — F329 Major depressive disorder, single episode, unspecified: Secondary | ICD-10-CM

## 2011-08-12 DIAGNOSIS — I10 Essential (primary) hypertension: Secondary | ICD-10-CM

## 2011-08-12 NOTE — Telephone Encounter (Signed)
Patient needed refill of her Wellbutrin 300mg  24 hr tablets.

## 2011-08-13 ENCOUNTER — Emergency Department (HOSPITAL_COMMUNITY)
Admission: EM | Admit: 2011-08-13 | Discharge: 2011-08-13 | Disposition: A | Discharge status: Home / Self Care | Payer: Medicaid Other | Attending: Emergency Medicine | Admitting: Emergency Medicine

## 2011-08-13 DIAGNOSIS — I10 Essential (primary) hypertension: Secondary | ICD-10-CM | POA: Insufficient documentation

## 2011-08-13 DIAGNOSIS — R51 Headache: Secondary | ICD-10-CM | POA: Insufficient documentation

## 2011-08-13 DIAGNOSIS — G500 Trigeminal neuralgia: Secondary | ICD-10-CM | POA: Insufficient documentation

## 2011-08-13 DIAGNOSIS — Z79899 Other long term (current) drug therapy: Secondary | ICD-10-CM | POA: Insufficient documentation

## 2011-08-18 ENCOUNTER — Other Ambulatory Visit: Payer: Self-pay | Admitting: Family Medicine

## 2011-08-18 MED ORDER — ZOLPIDEM TARTRATE 5 MG PO TABS: 5.0000 mg | ORAL_TABLET | Freq: Every evening | ORAL | Status: DC | PRN

## 2011-09-08 ENCOUNTER — Ambulatory Visit: Payer: Medicaid Other | Admitting: Obstetrics and Gynecology

## 2011-09-09 ENCOUNTER — Telehealth: Payer: Self-pay | Admitting: Family Medicine

## 2011-09-09 NOTE — Patient Instructions (Addendum)
Bariatric Surgery, Gastrointestinal Surgery for Severe Obesity Severe obesity is a longstanding condition. It is difficult to treat through diet and exercise alone. Gastrointestinal surgery is the best option for people who are severely obese and cannot lose weight by traditional means, or who suffer from serious obesity-related health problems. The surgery promotes weight loss by decreasing the absorption of food and, in some operations, interrupting the digestive process. As in other treatments for obesity, the best results are achieved with healthy eating behaviors and regular physical activity.   People who may consider gastrointestinal surgery include those with a body mass index (BMI) above 40. This is about 100 pounds of overweight for men and 80 pounds for women. People with a BMI between 35 and 40 and who suffer from type 2 diabetes or life-threatening cardiopulmonary (heart and lung) problems, such as severe sleep apnea or obesity-related heart disease, may also be candidates for surgery. (To use the Body Mass Index chart. find your weight on the bottom of the graph. Go straight up from that point until you come to the line that matches your height. Then look to find your weight group).  The idea of gastrointestinal surgery to control obesity grew out of results of operations for cancer or severe ulcers that removed large portions of the stomach or small intestine. Patients undergoing these procedures tended to lose weight after surgery. So some physicians began to use such operations to treat severe obesity. The first operation that was widely used for severe obesity was the intestinal bypass. This operation was first used 40 years ago. It produced weight loss by causing malabsorption. The idea was that patients could eat large amounts of food, which would be poorly digested or passed along too fast for the body to absorb many calories. The problem with this surgery was that it caused a loss of  essential nutrients. Also, its side effects were unpredictable and sometimes fatal. The original form of the intestinal bypass operation is no longer used. THE NORMAL DIGESTIVE PROCESS Normally, as food moves along the digestive tract, digestive juices and enzymes digest and absorb calories and nutrients. After we chew and swallow our food, it moves down the esophagus to the stomach. There a strong acid continues the digestive process. The stomach can hold about 3 pints of food at one time. When the stomach contents move to the first portion of the small intestine (duodenum ), bile and pancreatic juice speed up digestion. Most of the iron and calcium in the foods we eat is absorbed in the duodenum. The jejunum and ileum are the remaining two segments of the nearly 20 feet of small intestine. They complete the absorption of almost all calories and nutrients. The food particles that cannot be digested in the small intestine are stored in the large intestine until eliminated.   HOW DOES SURGERY PROMOTE WEIGHT LOSS? Gastrointestinal surgery for obesity is also called bariatric surgery. It alters the digestive process. The operations promote weight loss by closing off parts of the stomach. This will make it smaller. Operations that only reduce stomach size are known as "restrictive operations". They restrict the amount of food the stomach can hold. Some operations combine stomach restriction with a partial bypass of the small intestine. These procedures create a direct connection from the stomach to the lower segment of the small intestine. This causes bypassing portions of the digestive tract that absorb calories and nutrients. These are known as malabsorptive operations.   WHAT ARE THE SURGICAL OPTIONS? There are  several types of restrictive and malabsorptive operations. Each one carries its own benefits and risks.    Restrictive Operations  Restrictive operations serve only to restrict food intake. They do  not interfere with the normal digestive process. To perform the surgery, doctors create a small pouch at the top of the stomach where food enters from the esophagus. At first, the pouch holds about 1 ounce of food. It later expands to 2-3 ounces. The lower outlet of the pouch usually has a diameter of only about  inch. This small outlet delays the emptying of food from the pouch and causes a feeling of fullness. As a result of this surgery, most people lose the ability to eat large amounts of food at one time. After an operation, the person usually can eat only  to 1 cup of food without discomfort or nausea. Also, food has to be well chewed. Restrictive operations for obesity include adjustable gastric banding (AGB) and vertical banded gastroplasty (VBG).  Adjustable gastric banding  In this procedure, a hollow band made of special material is placed around the stomach near its upper end. This creates a small pouch and a narrow passage into the larger remainder of the stomach. The band is then inflated with a salt solution. It can be tightened or loosened over time to change the size of the passage by increasing or decreasing the amount of salt solution.   The band is adjusted based on feelings of hunger and weight loss. Patients decide when they need an adjustment and come to their surgeons to evaluate this. The adjustment is done as an office visit. The band is fully reversible with a second surgery if the patient changes his/her mind. There is no cutting or re-routing of the intestine.   Vertical banded gastroplasty  VBG has been the most common restrictive operation for weight control. Both a band and staples are used to create a small stomach pouch. Vertical banded gastroplasty is based on the same principle of restriction as the band. But the stomach is surgically altered with the stapling. This treatment is not reversible.  Restrictive operations lead to weight loss in almost all patients. But they  are less successful than malabsorptive operations in achieving substantial, long-term weight loss. About 30 percent of those who undergo VBG achieve normal weight. About 80 percent achieve some degree of weight loss. Some patients regain weight. Others are unable to adjust their eating habits and fail to lose the desired weight. Successful results depend on the patient's willingness to adopt a long-term plan of healthy eating and regular physical activity. A common risk of restrictive operations is vomiting. This is caused when the small stomach is overly stretched by food particles that have not been chewed well. Band slippage and saline leakage have been reported after AGB. Risks of VBG include wearing away of the band and breakdown of the staple line. In a small number of cases, stomach juices may leak into the abdomen. This requires an emergency operation. In less than 1 percent of all cases, infection or death from complications may occur.  Malabsorptive Operations  Malabsorptive operations are the most common gastrointestinal surgeries for weight loss. They restrict both food intake and the amount of calories and nutrients the body absorbs.    Roux-en-Y gastric bypass (RGB)  This operation is the most common and successful malabsorptive surgery. First, a small stomach pouch is created to restrict food intake. Next, a Y-shaped section of the small intestine is attached to the  pouch. This allows food to bypass the lower stomach, the first segment of the small intestine (duodenum), and the first portion of the jejunum (the second segment of the small intestine). This bypass reduces the amount of calories and nutrients the body absorbs.   Biliopancreatic diversion (BPD)  In this more complicated malabsorptive operation, portions of the stomach are removed. The small pouch that remains is connected directly to the final segment of the small intestine, completely bypassing the duodenum and the jejunum. This  procedure successfully promotes weight loss. But it is less frequently used than other types of surgery because of the high risk for nutritional deficiencies. A variation of BPD includes a "duodenal switch". This leaves a larger portion of the stomach intact, including the pyloric valve. This valve regulates the release of stomach contents into the small intestine. It also keeps a small part of the duodenum in the digestive pathway.  Malabsorptive operations produce more weight loss than restrictive operations. And they are more effective in reversing the health problems associated with severe obesity. Patients who have malabsorptive operations generally lose two-thirds of their excess weight within 2 years.   In addition to the risks of restrictive surgeries, malabsorptive operations also carry greater risk for nutritional deficiencies. This is because the procedure causes food to bypass the duodenum and jejunum. That is where most iron and calcium are absorbed. Menstruating women may develop anemia because not enough vitamin B12 and iron are absorbed. Decreased absorption of calcium may also bring on osteoporosis and metabolic bone disease. Patients are required to take nutritional supplements that usually prevent these deficiencies. Patients who have the biliopancreatic diversion surgery must also take fat-soluble (dissolved by fat) vitamins A, D, E, and K supplements.   RGB and BPD operations may also cause "dumping syndrome". This means that stomach contents move too rapidly through the small intestine. Symptoms include nausea, weakness, sweating, faintness, and sometimes diarrhea after eating. The duodenal switch operation keeps the pyloric valve intact. So it may reduce the likelihood of dumping syndrome.   The more extensive the bypass, the greater the risk is for complications and nutritional deficiencies. Patients with extensive bypasses of the normal digestive process require close monitoring. They  also need life-long use of special foods, supplements, and medications.   EXPLORE BENEFITS AND RISKS Surgery to produce weight loss is a serious undertaking. Anyone thinking about surgery should understand what the operation involves. Patients and physicians should carefully consider the following benefits and risks.    Benefits   Right after surgery, most patients lose weight quickly. They continue to lose for 18 to 24 months after the procedure. Most patients regain 5 to 10 percent of the weight they lost. But many maintain a long-term weight loss of about 100 pounds.   Surgery improves most obesity-related conditions. For example, in one study blood sugar levels of 83 percent of obese patients with diabetes returned to normal after surgery. Nearly all patients whose blood sugar levels did not return to normal were older. Or they had lived with diabetes for a long time.   Risks   Ten to 20 percent of patients who have weight-loss surgery require follow-up operations to correct complications. Abdominal hernia was the most common complication requiring follow-up surgery. But laparoscopic techniques seem to have solved this problem. In laparoscopy, the surgeon makes one or more small incisions. Slender surgical instruments are passed them. This technique eliminates the need for a large incision. And it creates less tissue damage. Patients who are  super obese (greater than 350 pounds) or have had previous abdominal surgery, may not be good candidates for laparoscopy. Less common complications include breakdown of the staple line and stretched stomach outlets.   Some obese patients who have weight-loss surgery develop gallstones. These are clumps of cholesterol and other matter that form in the gallbladder. During quick or substantial weight loss, one's risk of developing gallstones increases. Taking supplemental bile salts for the first 6 months after surgery can prevent them.   Nearly 30 percent of  patients who have weight-loss surgery develop nutritional deficiencies. These include anemia, osteoporosis, and metabolic bone disease. These usually can be avoided if vitamin and mineral intakes are high enough.   Women of childbearing age should avoid pregnancy until their weight becomes stable. Quick weight loss and nutritional deficiencies can harm a growing fetus.   Other risks of restrictive surgeries include:   Band slippage.     Stomach prolapse.      Band erosion into the lumen of the stomach.   Port infection.       The main risk with malabsorption operations is life threatening. It is the risk of leak from any of the anastomosis. The more involved the operation, the more risk involved.   There is one other risk of having the surgery. If people do not follow a strict diet, they will stretch out their stomach pouches. Then they will not lose weight.  MEDICAL COSTS Gastrointestinal surgery costs vary. They depend on the procedure. Medical insurance coverage varies by state and insurance provider. If you are considering gastrointestinal surgery, contact your:  Regional Medicare or Medicaid office.   Or insurance plan.  Find out from them if the procedure is covered. IS THE SURGERY FOR YOU?  Gastrointestinal surgery may be the next step for people who:   remain severely obese after trying nonsurgical approaches.   have an obesity-related disease.   Candidates for surgery have:   A BMI of 40 or more.   A BMI of 35 or more and a life-threatening obesity-related health problem such as:   Diabetes.   Severe sleep apnea.   Heart disease.   Obesity-related physical problems that interfere with:   Employment.   Walking.   Family function.  If you fit the profile for surgery, answers to these questions may help you decide whether weight-loss surgery is appropriate for you. Are you:  Unlikely to lose weight successfully without surgery?   Well informed about the  surgical procedure? The effects of treatment?   Determined to lose weight? Improve your health?   Aware of how your life may change after the operation? Adjustment to the side effects of the surgery include the need to chew well and being unable to eat large meals.   Aware of the potential for serious complications? Dietary restrictions? Occasional failures?   Committed to lifelong medical follow-up?   Restrictive operations are very successful with patients who follow a diet created by a dietician. Support groups and follow up with caregivers is important.  Remember: There are no guarantees for any method to produce and maintain weight loss. This includes surgery. Success is possible only with:  Maximum cooperation.   Commitment to behavioral change.   Medical follow-up.  This cooperation and commitment must be carried out for the rest of your life.   ADDITIONAL RESOURCES [employer]  [address], [address] [place], [place] [address] www.asbs.org   [employer] (WIN)  [address] [place] [id number] [email or web address]   Document Released: 12/15/2005 Document Re-Released: 06/04/2010 [facility] Patient Information 2011 [place], [place].Bariatric Surgery, Gastrointestinal Surgery for Severe Obesity Severe obesity is a longstanding condition. It is difficult to treat through diet and exercise alone. Gastrointestinal surgery is the best option for people who are severely obese and cannot lose weight by traditional means, or who suffer from serious obesity-related health problems. The surgery promotes weight loss by decreasing the absorption of food and, in some operations, interrupting the digestive process. As in other treatments for obesity, the best results are achieved with healthy eating behaviors and regular physical activity.   People who may consider gastrointestinal surgery include those with a body mass index  (BMI) above 40. This is about 100 pounds of overweight for men and 80 pounds for women. People with a BMI between 35 and 40 and who suffer from type 2 diabetes or life-threatening cardiopulmonary (heart and lung) problems, such as severe sleep apnea or obesity-related heart disease, may also be candidates for surgery. (To use the Body Mass Index chart. find your weight on the bottom of the graph. Go straight up from that point until you come to the line that matches your height. Then look to find your weight group).  The idea of gastrointestinal surgery to control obesity grew out of results of operations for cancer or severe ulcers that removed large portions of the stomach or small intestine. Patients undergoing these procedures tended to lose weight after surgery. So some physicians began to use such operations to treat severe obesity. The first operation that was widely used for severe obesity was the intestinal bypass. This operation was first used 40 years ago. It produced weight loss by causing malabsorption. The idea was that patients could eat large amounts of food, which would be poorly digested or passed along too fast for the body to absorb many calories. The problem with this surgery was that it caused a loss of essential nutrients. Also, its side effects were unpredictable and sometimes fatal. The original form of the intestinal bypass operation is no longer used. THE NORMAL DIGESTIVE PROCESS Normally, as food moves along the digestive tract, digestive juices and enzymes digest and absorb calories and nutrients. After we chew and swallow our food, it moves down the esophagus to the stomach. There a strong acid continues the digestive process. The stomach can hold about 3 pints of food at one time. When the stomach contents move to the first portion of the small intestine (duodenum ), bile and pancreatic juice speed up digestion. Most of the iron and calcium in the foods we eat is absorbed in the  duodenum. The jejunum and ileum are the remaining two segments of the nearly 20 feet of small intestine. They complete the absorption of almost all calories and nutrients. The food particles that cannot be digested in the small intestine are stored in the large intestine until eliminated.   HOW DOES SURGERY PROMOTE WEIGHT LOSS? Gastrointestinal surgery for obesity is also called bariatric surgery. It alters the digestive process. The operations promote weight loss by closing off parts of the stomach. This will make it smaller. Operations that only reduce stomach size are known as "restrictive operations". They restrict the amount of food the stomach can hold. Some operations combine stomach restriction with a partial bypass of the small intestine. These procedures create a direct connection from the stomach to the lower segment of the small intestine. This causes bypassing portions of the digestive tract that  absorb calories and nutrients. These are known as malabsorptive operations.   WHAT ARE THE SURGICAL OPTIONS? There are several types of restrictive and malabsorptive operations. Each one carries its own benefits and risks.    Restrictive Operations  Restrictive operations serve only to restrict food intake. They do not interfere with the normal digestive process. To perform the surgery, doctors create a small pouch at the top of the stomach where food enters from the esophagus. At first, the pouch holds about 1 ounce of food. It later expands to 2-3 ounces. The lower outlet of the pouch usually has a diameter of only about  inch. This small outlet delays the emptying of food from the pouch and causes a feeling of fullness. As a result of this surgery, most people lose the ability to eat large amounts of food at one time. After an operation, the person usually can eat only  to 1 cup of food without discomfort or nausea. Also, food has to be well chewed. Restrictive operations for obesity include  adjustable gastric banding (AGB) and vertical banded gastroplasty (VBG).  Adjustable gastric banding  In this procedure, a hollow band made of special material is placed around the stomach near its upper end. This creates a small pouch and a narrow passage into the larger remainder of the stomach. The band is then inflated with a salt solution. It can be tightened or loosened over time to change the size of the passage by increasing or decreasing the amount of salt solution.   The band is adjusted based on feelings of hunger and weight loss. Patients decide when they need an adjustment and come to their surgeons to evaluate this. The adjustment is done as an office visit. The band is fully reversible with a second surgery if the patient changes his/her mind. There is no cutting or re-routing of the intestine.   Vertical banded gastroplasty  VBG has been the most common restrictive operation for weight control. Both a band and staples are used to create a small stomach pouch. Vertical banded gastroplasty is based on the same principle of restriction as the band. But the stomach is surgically altered with the stapling. This treatment is not reversible.  Restrictive operations lead to weight loss in almost all patients. But they are less successful than malabsorptive operations in achieving substantial, long-term weight loss. About 30 percent of those who undergo VBG achieve normal weight. About 80 percent achieve some degree of weight loss. Some patients regain weight. Others are unable to adjust their eating habits and fail to lose the desired weight. Successful results depend on the patient's willingness to adopt a long-term plan of healthy eating and regular physical activity. A common risk of restrictive operations is vomiting. This is caused when the small stomach is overly stretched by food particles that have not been chewed well. Band slippage and saline leakage have been reported after AGB. Risks of  VBG include wearing away of the band and breakdown of the staple line. In a small number of cases, stomach juices may leak into the abdomen. This requires an emergency operation. In less than 1 percent of all cases, infection or death from complications may occur.  Malabsorptive Operations  Malabsorptive operations are the most common gastrointestinal surgeries for weight loss. They restrict both food intake and the amount of calories and nutrients the body absorbs.    Roux-en-Y gastric bypass (RGB)  This operation is the most common and successful malabsorptive surgery. First, a small stomach  pouch is created to restrict food intake. Next, a Y-shaped section of the small intestine is attached to the pouch. This allows food to bypass the lower stomach, the first segment of the small intestine (duodenum), and the first portion of the jejunum (the second segment of the small intestine). This bypass reduces the amount of calories and nutrients the body absorbs.   Biliopancreatic diversion (BPD)  In this more complicated malabsorptive operation, portions of the stomach are removed. The small pouch that remains is connected directly to the final segment of the small intestine, completely bypassing the duodenum and the jejunum. This procedure successfully promotes weight loss. But it is less frequently used than other types of surgery because of the high risk for nutritional deficiencies. A variation of BPD includes a "duodenal switch". This leaves a larger portion of the stomach intact, including the pyloric valve. This valve regulates the release of stomach contents into the small intestine. It also keeps a small part of the duodenum in the digestive pathway.  Malabsorptive operations produce more weight loss than restrictive operations. And they are more effective in reversing the health problems associated with severe obesity. Patients who have malabsorptive operations generally lose two-thirds of their excess  weight within 2 years.   In addition to the risks of restrictive surgeries, malabsorptive operations also carry greater risk for nutritional deficiencies. This is because the procedure causes food to bypass the duodenum and jejunum. That is where most iron and calcium are absorbed. Menstruating women may develop anemia because not enough vitamin B12 and iron are absorbed. Decreased absorption of calcium may also bring on osteoporosis and metabolic bone disease. Patients are required to take nutritional supplements that usually prevent these deficiencies. Patients who have the biliopancreatic diversion surgery must also take fat-soluble (dissolved by fat) vitamins A, D, E, and K supplements.   RGB and BPD operations may also cause "dumping syndrome". This means that stomach contents move too rapidly through the small intestine. Symptoms include nausea, weakness, sweating, faintness, and sometimes diarrhea after eating. The duodenal switch operation keeps the pyloric valve intact. So it may reduce the likelihood of dumping syndrome.   The more extensive the bypass, the greater the risk is for complications and nutritional deficiencies. Patients with extensive bypasses of the normal digestive process require close monitoring. They also need life-long use of special foods, supplements, and medications.   EXPLORE BENEFITS AND RISKS Surgery to produce weight loss is a serious undertaking. Anyone thinking about surgery should understand what the operation involves. Patients and physicians should carefully consider the following benefits and risks.    Benefits   Right after surgery, most patients lose weight quickly. They continue to lose for 18 to 24 months after the procedure. Most patients regain 5 to 10 percent of the weight they lost. But many maintain a long-term weight loss of about 100 pounds.   Surgery improves most obesity-related conditions. For example, in one study blood sugar levels of 83 percent of  obese patients with diabetes returned to normal after surgery. Nearly all patients whose blood sugar levels did not return to normal were older. Or they had lived with diabetes for a long time.   Risks   Ten to 20 percent of patients who have weight-loss surgery require follow-up operations to correct complications. Abdominal hernia was the most common complication requiring follow-up surgery. But laparoscopic techniques seem to have solved this problem. In laparoscopy, the surgeon makes one or more small incisions. Slender surgical instruments are passed  them. This technique eliminates the need for a large incision. And it creates less tissue damage. Patients who are super obese (greater than 350 pounds) or have had previous abdominal surgery, may not be good candidates for laparoscopy. Less common complications include breakdown of the staple line and stretched stomach outlets.   Some obese patients who have weight-loss surgery develop gallstones. These are clumps of cholesterol and other matter that form in the gallbladder. During quick or substantial weight loss, one's risk of developing gallstones increases. Taking supplemental bile salts for the first 6 months after surgery can prevent them.   Nearly 30 percent of patients who have weight-loss surgery develop nutritional deficiencies. These include anemia, osteoporosis, and metabolic bone disease. These usually can be avoided if vitamin and mineral intakes are high enough.   Women of childbearing age should avoid pregnancy until their weight becomes stable. Quick weight loss and nutritional deficiencies can harm a growing fetus.   Other risks of restrictive surgeries include:   Band slippage.     Stomach prolapse.      Band erosion into the lumen of the stomach.   Port infection.       The main risk with malabsorption operations is life threatening. It is the risk of leak from any of the anastomosis. The more involved the operation, the  more risk involved.   There is one other risk of having the surgery. If people do not follow a strict diet, they will stretch out their stomach pouches. Then they will not lose weight.  MEDICAL COSTS Gastrointestinal surgery costs vary. They depend on the procedure. Medical insurance coverage varies by state and insurance provider. If you are considering gastrointestinal surgery, contact your:  Regional Medicare or Medicaid office.   Or insurance plan.  Find out from them if the procedure is covered. IS THE SURGERY FOR YOU?  Gastrointestinal surgery may be the next step for people who:   remain severely obese after trying nonsurgical approaches.   have an obesity-related disease.   Candidates for surgery have:   A BMI of 40 or more.   A BMI of 35 or more and a life-threatening obesity-related health problem such as:   Diabetes.   Severe sleep apnea.   Heart disease.   Obesity-related physical problems that interfere with:   Employment.   Walking.   Family function.  If you fit the profile for surgery, answers to these questions may help you decide whether weight-loss surgery is appropriate for you. Are you:  Unlikely to lose weight successfully without surgery?   Well informed about the surgical procedure? The effects of treatment?   Determined to lose weight? Improve your health?   Aware of how your life may change after the operation? Adjustment to the side effects of the surgery include the need to chew well and being unable to eat large meals.   Aware of the potential for serious complications? Dietary restrictions? Occasional failures?   Committed to lifelong medical follow-up?   Restrictive operations are very successful with patients who follow a diet created by a dietician. Support groups and follow up with caregivers is important.  Remember: There are no guarantees for any method to produce and maintain weight loss. This includes surgery. Success is  possible only with:  Maximum cooperation.   Commitment to behavioral change.   Medical follow-up.  This cooperation and commitment must be carried out for the rest of your life.   ADDITIONAL RESOURCES [employer]  [address], [place] [address] www.asbs.org   Weight-control Information Network (WIN)  [address] [place] [address] FindSpin.nl   Document Released: 12/15/2005 Document Re-Released: 06/04/2010 [facility] Patient Information 2011 [place], [place].

## 2011-09-09 NOTE — Telephone Encounter (Signed)
Ms. Kohan calling about handouts she was suppose to receive from you regarding procedure.  Have been waiting 3 wks.  Would like to have you contact her to let her know something

## 2011-09-10 ENCOUNTER — Ambulatory Visit (INDEPENDENT_AMBULATORY_CARE_PROVIDER_SITE_OTHER): Payer: Medicaid Other | Admitting: Obstetrics & Gynecology

## 2011-09-10 ENCOUNTER — Encounter: Payer: Self-pay | Admitting: Obstetrics & Gynecology

## 2011-09-10 DIAGNOSIS — N949 Unspecified condition associated with female genital organs and menstrual cycle: Secondary | ICD-10-CM

## 2011-09-10 DIAGNOSIS — R102 Pelvic and perineal pain: Secondary | ICD-10-CM

## 2011-09-10 DIAGNOSIS — Z1239 Encounter for other screening for malignant neoplasm of breast: Secondary | ICD-10-CM

## 2011-09-10 DIAGNOSIS — Z1231 Encounter for screening mammogram for malignant neoplasm of breast: Secondary | ICD-10-CM

## 2011-09-10 DIAGNOSIS — N898 Other specified noninflammatory disorders of vagina: Secondary | ICD-10-CM

## 2011-09-10 DIAGNOSIS — N912 Amenorrhea, unspecified: Secondary | ICD-10-CM

## 2011-09-10 LAB — POCT URINALYSIS DIPSTICK: Bilirubin, UA: NEGATIVE

## 2011-09-10 LAB — POCT URINE PREGNANCY: Preg Test, Ur: NEGATIVE

## 2011-09-10 MED ORDER — SULFAMETHOXAZOLE-TRIMETHOPRIM 800-160 MG PO TABS: 1.0000 | ORAL_TABLET | Freq: Two times a day (BID) | ORAL | Status: AC

## 2011-09-10 MED ORDER — METRONIDAZOLE 0.75 % VA GEL: 1.0000 | Freq: Every day | VAGINAL | Status: AC

## 2011-09-10 NOTE — Progress Notes (Signed)
  Subjective:     Jessica [name] is a 50 y.o. female who presents for evaluation of pelvic pain. The pain is described as burning and cramping, and is 5/10 in intensity. Pain is located in the suprapubic and deep pelvis area without radiation. Onset was about one week ago occurring several weeks ago. Symptoms have been unchanged since. Aggravating factors: none. Alleviating factors: none. Associated symptoms: none. The patient denies dysuria, fever, frequency, nausea, sweats and vomiting. Risk factors for pelvic/abdominal pain include none.  Menstrual History: OB History    Grav Para Term Preterm Abortions TAB SAB Ect Mult Living                  Patient's last menstrual period was 07/19/2011.    The following portions of the patient's history were reviewed and updated as appropriate: allergies, current medications, past family history, past medical history, past social history, past surgical history and problem list.   Review of Systems Pertinent items are noted in HPI.    Objective:    BP 150/70  Pulse 84  Ht 5\' 1"  (1.549 m)  Wt 281 lb (127.461 kg)  BMI 53.09 kg/m2  LMP 07/19/2011 General:   alert and no distress  Lungs:   clear to auscultation bilaterally  Heart:   regularly irregular rhythm  Abdomen:  obese, soft, mild tenderness to deep palpation, no organomegaly  CVA:   absent  Pelvis:  NEFG. Scant white discharge in vagina, pink and well-ruggated.  Normal cervix.  Uterus and adnexae not palpable secondary to habitus.  Wet prep, GC/Chlam samples obtained.  Extremities:   extremities normal, atraumatic, no cyanosis or edema  Neurologic:   negative  Psychiatric:   normal mood, behavior, speech, dress, and thought processes   Lab Review Labs: Urine pregnancy test, Urine culture, colony count, sensitivity, GC/Chlamydia DNA probe of cervico/vaginal secretions and Wet mount of vaginal secretions  Urinalysis showed + nitrites urine culture sent.  Imaging Ultrasound - Pelvic  Vaginal    Assessment:  Pelvic pain, vaginal discharge.  Likely UTI.   Plan:  UTI: Prescribed Bactrim Vaginitis: Prescribed Metrogel (has bad GI side effects from oral Flagyl) Follow up pelvic ultrasound and urine culture Return for pap and mammogram.

## 2011-09-10 NOTE — Progress Notes (Signed)
Addended by: Vinnie Langton C on: 09/10/2011 10:52 AM   Modules accepted: Orders

## 2011-09-11 LAB — WET PREP, GENITAL
Clue Cells Wet Prep HPF POC: NONE SEEN
Trich, Wet Prep: NONE SEEN

## 2011-09-11 NOTE — Telephone Encounter (Signed)
Letter mailed to pt with info.

## 2011-09-12 ENCOUNTER — Ambulatory Visit (HOSPITAL_COMMUNITY)
Admission: RE | Admit: 2011-09-12 | Discharge: 2011-09-12 | Disposition: A | Discharge status: Home / Self Care | Payer: Medicaid Other | Source: Ambulatory Visit | Attending: Obstetrics & Gynecology | Admitting: Obstetrics & Gynecology

## 2011-09-12 DIAGNOSIS — R102 Pelvic and perineal pain: Secondary | ICD-10-CM

## 2011-09-12 DIAGNOSIS — D259 Leiomyoma of uterus, unspecified: Secondary | ICD-10-CM | POA: Insufficient documentation

## 2011-09-12 DIAGNOSIS — N949 Unspecified condition associated with female genital organs and menstrual cycle: Secondary | ICD-10-CM | POA: Insufficient documentation

## 2011-09-12 DIAGNOSIS — Z8049 Family history of malignant neoplasm of other genital organs: Secondary | ICD-10-CM | POA: Insufficient documentation

## 2011-09-12 LAB — URINE CULTURE: Colony Count: >=100000

## 2011-09-16 ENCOUNTER — Ambulatory Visit (INDEPENDENT_AMBULATORY_CARE_PROVIDER_SITE_OTHER): Payer: Medicaid Other | Admitting: Obstetrics & Gynecology

## 2011-09-16 ENCOUNTER — Encounter: Payer: Self-pay | Admitting: Obstetrics & Gynecology

## 2011-09-16 VITALS — BP 108/75 | HR 80 | Ht 61.0 in | Wt 286.0 lb

## 2011-09-16 DIAGNOSIS — R102 Pelvic and perineal pain: Secondary | ICD-10-CM | POA: Insufficient documentation

## 2011-09-16 DIAGNOSIS — N920 Excessive and frequent menstruation with regular cycle: Secondary | ICD-10-CM

## 2011-09-16 DIAGNOSIS — N949 Unspecified condition associated with female genital organs and menstrual cycle: Secondary | ICD-10-CM

## 2011-09-16 DIAGNOSIS — G8929 Other chronic pain: Secondary | ICD-10-CM

## 2011-09-16 MED ORDER — MEDROXYPROGESTERONE ACETATE 10 MG PO TABS: 20.0000 mg | ORAL_TABLET | Freq: Every day | ORAL | Status: DC

## 2011-09-16 NOTE — Progress Notes (Signed)
Patient here today for ultrasound results and follow up for pelvic pain.  She also reports heavy menstrual bleeding; soaked through several pads.  Denies any symptoms of anemia.  TRANSABDOMINAL AND TRANSVAGINAL ULTRASOUND OF PELVIS (09/10/11) Findings: Uterus: Demonstrates a sagittal length of 9.4 cm, AP depth of 4.8 cm and a transverse width of 4.5 cm. A focal fibroid is identified in the anterior lower uterine segment measuring 2.6 x 2.1 x 2.4 cm. This is mural in location. Mild diffuse heterogeneity of the myometrium is otherwise seen with no other focal fibroids identified. No areas of focal thickening or heterogeneity are identified. No focal mural thickening, mural cysts or loss of the myometrial endometrial interface is identified to otherwise suggest adenomyosis.  Endometrium: Is thin and echogenic with an AP width of 5.5 mm.  Right ovary: Measures 2.1 by 2.4 by 2.1 cm and appears poorly resolved endovaginally due to scanning parameters. No definite focal parenchymal abnormality is identified  Left ovary: Is well seen only transabdominally and has a normal  appearance measuring 2.7 by 1.1 x 2.3 cm  Other findings: No pelvic fluid or separate adnexal masses are seen.  IMPRESSION: Focal fibroid with size and location as noted above. Otherwise mild diffuse myometrial heterogeneity which can be seen with adenomyosis although no other distinguishing features of this process are evident.  Normal endometrial lining and left ovary. No obvious right ovarian abnormality with poor resolution possible.  Negative GC/Chlam, wet prep and urine cultures from last visit.  TSH 1.346 in April 2012.  The following portions of the patient's history were reviewed and updated as appropriate: allergies, current medications, past family history, past medical history, past social history, past surgical history and problem list. Last pap smear in 10/17/2010 was negative but had an absent endocervical component; normal pap  smears prior to this.  Given her menorrhagia, endometrial biopsy was recommended.  Will do this at her next visit along with her pap smear and annual exam. Mammogram to be ordered.  Pending the results of the pap and endometrial biopsy, will discuss management options at next visit.  In the meantime, ordered Provera 20 mg po qd for menorrhagia.  Return to clinic for next appointment or earlier as needed.

## 2011-09-16 NOTE — Patient Instructions (Signed)
Fibroids (Leiomyoma's) You have been diagnosed as having a fibroid. Fibroids are smooth muscle lumps (tumors) which can occur any place in a woman's body. They are usually in the womb (uterus). The most common problem (symptom) of fibroids is bleeding. Over time this may cause low red blood cells (anemia). Other symptoms include feelings of pressure and pain in the pelvis. The diagnosis (learning what is wrong) of fibroids is made by physical exam. Sometimes tests such as an ultrasound are used. This is helpful when fibroids are felt around the ovaries and to look for tumors. TREATMENT  Most fibroids do not need surgical or medical treatment. Sometimes a tissue sample (biopsy) of the lining of the uterus is done to rule out cancer. If there is no cancer and only a small amount of bleeding, the problem can be watched.   Hormonal treatment can improve the problem.   When surgery is needed, it can consist of removing the fibroid. Vaginal birth may not be possible after the removal of fibroids. This depends on where they are and the extent of surgery. When pregnancy occurs with fibroids it is usually normal.   Your caregiver can help decide which treatments are best for you.  HOME CARE INSTRUCTIONS  Do not use aspirin as this may increase bleeding problems.   If your periods (menses) are heavy, record the number of pads or tampons used per month. Bring this information to your caregiver. This can help them determine the best treatment for you.  SEEK IMMEDIATE MEDICAL ATTENTION IF:  You have pelvic pain or cramps not controlled with medications, or experience a sudden increase in pain.   You have an increase of pelvic bleeding between and during menses.   You feel lightheaded or have fainting spells.   You develop worsening belly (abdominal) pain.  Document Released: 12/12/2000 Document Re-Released: 03/23/2008 [facility] Patient Information 2011 [place], [place].

## 2011-09-25 LAB — POCT URINALYSIS DIP (DEVICE)
Bilirubin Urine: NEGATIVE
Glucose, UA: NEGATIVE
Ketones, ur: NEGATIVE
Nitrite: NEGATIVE
pH: 7.5

## 2011-09-25 LAB — GC/CHLAMYDIA PROBE AMP, GENITAL
Chlamydia, DNA Probe: NEGATIVE
GC Probe Amp, Genital: NEGATIVE

## 2011-09-25 LAB — WET PREP, GENITAL
Trich, Wet Prep: NONE SEEN
WBC, Wet Prep HPF POC: NONE SEEN
Yeast Wet Prep HPF POC: NONE SEEN

## 2011-09-25 LAB — POCT PREGNANCY, URINE
Operator id: 282151
Preg Test, Ur: NEGATIVE

## 2011-09-29 LAB — GC/CHLAMYDIA PROBE AMP, GENITAL: Chlamydia, DNA Probe: NEGATIVE

## 2011-09-29 LAB — URINALYSIS, ROUTINE W REFLEX MICROSCOPIC
Glucose, UA: NEGATIVE
Hgb urine dipstick: NEGATIVE
Ketones, ur: NEGATIVE
Nitrite: NEGATIVE
Specific Gravity, Urine: 1.027
pH: 5.5

## 2011-09-29 LAB — WET PREP, GENITAL
Trich, Wet Prep: NONE SEEN
WBC, Wet Prep HPF POC: NONE SEEN
Yeast Wet Prep HPF POC: NONE SEEN

## 2011-10-01 ENCOUNTER — Emergency Department (HOSPITAL_COMMUNITY)
Admission: EM | Admit: 2011-10-01 | Discharge: 2011-10-02 | Disposition: A | Discharge status: Home / Self Care | Payer: Medicaid Other | Attending: Emergency Medicine | Admitting: Emergency Medicine

## 2011-10-01 DIAGNOSIS — L03019 Cellulitis of unspecified finger: Secondary | ICD-10-CM | POA: Insufficient documentation

## 2011-10-01 DIAGNOSIS — I10 Essential (primary) hypertension: Secondary | ICD-10-CM | POA: Insufficient documentation

## 2011-10-01 DIAGNOSIS — L02519 Cutaneous abscess of unspecified hand: Secondary | ICD-10-CM | POA: Insufficient documentation

## 2011-10-01 DIAGNOSIS — M79609 Pain in unspecified limb: Secondary | ICD-10-CM | POA: Insufficient documentation

## 2011-10-03 LAB — POCT I-STAT, CHEM 8
BUN: 8 mg/dL (ref 6–23)
Calcium, Ion: 1.13 mmol/L (ref 1.12–1.32)
Hemoglobin: 13.9 g/dL (ref 12.0–15.0)
Sodium: 140 mEq/L (ref 135–145)
TCO2: 26 mmol/L (ref 0–100)

## 2011-10-08 ENCOUNTER — Encounter (HOSPITAL_BASED_OUTPATIENT_CLINIC_OR_DEPARTMENT_OTHER): Payer: Self-pay

## 2011-10-08 ENCOUNTER — Emergency Department (HOSPITAL_BASED_OUTPATIENT_CLINIC_OR_DEPARTMENT_OTHER)
Admission: EM | Admit: 2011-10-08 | Discharge: 2011-10-09 | Disposition: A | Discharge status: Home / Self Care | Payer: Medicaid Other | Attending: Emergency Medicine | Admitting: Emergency Medicine

## 2011-10-08 DIAGNOSIS — E669 Obesity, unspecified: Secondary | ICD-10-CM | POA: Insufficient documentation

## 2011-10-08 DIAGNOSIS — F329 Major depressive disorder, single episode, unspecified: Secondary | ICD-10-CM | POA: Insufficient documentation

## 2011-10-08 DIAGNOSIS — IMO0002 Reserved for concepts with insufficient information to code with codable children: Secondary | ICD-10-CM | POA: Insufficient documentation

## 2011-10-08 DIAGNOSIS — Z79899 Other long term (current) drug therapy: Secondary | ICD-10-CM | POA: Insufficient documentation

## 2011-10-08 DIAGNOSIS — I10 Essential (primary) hypertension: Secondary | ICD-10-CM | POA: Insufficient documentation

## 2011-10-08 DIAGNOSIS — S0501XA Injury of conjunctiva and corneal abrasion without foreign body, right eye, initial encounter: Secondary | ICD-10-CM

## 2011-10-08 DIAGNOSIS — S058X9A Other injuries of unspecified eye and orbit, initial encounter: Secondary | ICD-10-CM | POA: Insufficient documentation

## 2011-10-08 DIAGNOSIS — F3289 Other specified depressive episodes: Secondary | ICD-10-CM | POA: Insufficient documentation

## 2011-10-08 NOTE — ED Notes (Signed)
Right eye pain since removing false eye lashes 3 hours PTA

## 2011-10-09 ENCOUNTER — Encounter (HOSPITAL_BASED_OUTPATIENT_CLINIC_OR_DEPARTMENT_OTHER): Payer: Self-pay | Admitting: Emergency Medicine

## 2011-10-09 MED ORDER — TETRACAINE HCL 0.5 % OP SOLN
1.0000 [drp] | Freq: Once | OPHTHALMIC | Status: AC
  Administered 2011-10-09: 2 [drp] via OPHTHALMIC
  Filled 2011-10-09: qty 2

## 2011-10-09 MED ORDER — FLUORESCEIN SODIUM 1 MG OP STRP
1.0000 | ORAL_STRIP | Freq: Once | OPHTHALMIC | Status: AC
  Administered 2011-10-09: 01:00:00 via OPHTHALMIC
  Filled 2011-10-09: qty 1

## 2011-10-09 MED ORDER — MOXIFLOXACIN HCL 0.5 % OP SOLN
2.0000 [drp] | Freq: Once | OPHTHALMIC | Status: DC
  Filled 2011-10-09: qty 3

## 2011-10-09 MED ORDER — MOXIFLOXACIN HCL 0.5 % OP SOLN: 1.0000 [drp] | Freq: Once | OPHTHALMIC | Status: AC

## 2011-10-09 NOTE — ED Provider Notes (Signed)
History     CSN: [id number] Arrival date & time: 10/08/2011 11:57 PM  Chief Complaint  Patient presents with  . Eye Pain    (Consider location/radiation/quality/duration/timing/severity/associated sxs/prior treatment) The history is provided by the patient. No language interpreter was used.  Jessica [name] is a 50 y.o.female who presents today complaining of right eye pain.She reports that the pain associated with this is a 10 out of 10.  The pain is in the  right eye.  It radiates to her face.  It is made better by staying out of the light. It is made worse by palpation or any use of the right eye. The patient had symptoms beginning after her application of the sulci lashes. Patient has had this before. She does report a latex allergy. Patient has removed her lashes. She does like she has something in her right eye. She states that her vision is blurry. She is a contact lens wearer but has not been wearing these today. There are no other associated or modifying factors.     Past Medical History  Diagnosis Date  . Depression   . Hypertension   . Obesity     Past Surgical History  Procedure Date  . Neck surgery   . Tubal ligation     Family History  Problem Relation Age of Onset  . Diabetes Mother   . Diabetes Father   . Diabetes Sister   . Cancer Paternal Aunt     cevical  . Cancer Paternal Aunt     cervical    History  Substance Use Topics  . Smoking status: Never Smoker   . Smokeless tobacco: Not on file  . Alcohol Use: Yes     social    OB History    Grav Para Term Preterm Abortions TAB SAB Ect Mult Living                  Review of Systems  Constitutional: Negative.   HENT: Negative.   Eyes: Positive for photophobia and pain.  Cardiovascular: Negative.   Gastrointestinal: Negative.   Genitourinary: Negative.   Musculoskeletal: Negative.   Skin: Negative.   Neurological: Negative.   Hematological: Negative.   Psychiatric/Behavioral: Negative.     All other systems reviewed and are negative.    Allergies  Ace inhibitors; Angiotensin receptor blockers; Latex; and Lisinopril  Home Medications   Current Outpatient Rx  Name Route Sig Dispense Refill  . ACYCLOVIR 5 % EX OINT  Use the cream 5 times a day for 5 days 15 g 0  . BUPROPION HCL ER (XL) 300 MG PO TB24  TAKE 1 TABLET BY MOUTH DAILY 90 tablet 3  . [employer] STOOL SOFTENER 100 MG PO CAPS  TAKE 1 CAPSULE BY MOUTH 2 TIMES A DAY 60 capsule 1  . CYCLOBENZAPRINE HCL 10 MG PO TABS Oral Take 1 tablet (10 mg total) by mouth 2 (two) times daily as needed for muscle spasms. For back spasms  30 tablet 6  . FLUTICASONE PROPIONATE 50 MCG/ACT NA SUSP Nasal 2 sprays by Nasal route daily. 16 g 2  . HYDROCHLOROTHIAZIDE 25 MG PO TABS  TAKE 1 TABLET BY MOUTH ONCE DAILY 30 tablet 3  . LORATADINE 10 MG PO TABS Oral Take 1 tablet (10 mg total) by mouth daily. 10 tablet 0  . MEDROXYPROGESTERONE ACETATE 10 MG PO TABS Oral Take 2 tablets (20 mg total) by mouth daily. 30 tablet 6  . MELOXICAM 7.5 MG PO TABS Oral  Take 1 tablet (7.5 mg total) by mouth daily. 30 tablet 12  . MOXIFLOXACIN HCL 0.5 % OP SOLN Right Eye Place 1 drop into the right eye once. Place one drop into right eye every 2 hours while awake for 2 days, then every 4-8 hours for 5 days. 3 mL 0  . POLYETHYLENE GLYCOL 3350 PO PACK Oral Take 17 g by mouth daily. Mix into 8 ounces of fluid. Use daily for 2 weeks 14 each 4  . SUMATRIPTAN SUCCINATE 25 MG PO TABS Oral Take 25 mg by mouth once. May repeat once in 2 hrs     . TOPIRAMATE 25 MG PO TABS  TAKE 1 TABLET TWICE DAILY 60 tablet 2  . VALACYCLOVIR HCL 500 MG PO TABS  TAKE 1 TABLET BY MOUTH EVERY DAY 90 tablet 3  . ZOLPIDEM TARTRATE 5 MG PO TABS Oral Take 1 tablet (5 mg total) by mouth at bedtime as needed. 15 tablet 4    BP 135/67  Pulse 84  Temp(Src) 98.2 F (36.8 C) (Oral)  Resp 20  Ht 5\' 1"  (1.549 m)  Wt 272 lb (123.378 kg)  BMI 51.39 kg/m2  SpO2 100%  LMP 07/19/2011  Physical Exam   Nursing note and vitals reviewed. Constitutional: She is oriented to person, place, and time. She appears well-developed and well-nourished. No distress.       Uncomfortable appearing  HENT:  Head: Normocephalic and atraumatic.  Eyes: EOM are normal. Pupils are equal, round, and reactive to light. Right eye exhibits discharge. Left eye exhibits no discharge. No scleral icterus.       Conjunctiva and sclera are injected. Fluorescein exam of the right eye demonstrates corneal abrasions. Patient has no signs of iritis on slit-lamp exam visual acuity is restored to normal once patient has had tetracaine applied to the eye  Neck: Normal range of motion.  Neurological: She is alert and oriented to person, place, and time. No cranial nerve deficit. She exhibits normal muscle tone. Coordination normal.  Skin: Skin is warm and dry. No rash noted.  Psychiatric: She has a normal mood and affect.    ED Course  Procedures (including critical care time)  Labs Reviewed - No data to display No results found.   1. Corneal abrasion, right       MDM  Patient was evaluated and had history consistent with possible corneal abrasion. Patient was not currently wearing her contact lenses. I did perform exam with Woods lamp with 4 seen in tetracaine. With application of tetracaine patient's symptoms completely resolved. She did not have foreign body sensation any longer and her blurry vision completely resolved. Patient did have evidence of corneal abrasions. She was given a prescription for Vigamox. She was instructed to use this one drop in the right eye every 2 hours while awake for 2 days and then every 4-8 hours for 5 days. Patient to followup with her regular doctor. She was told not to wear her contact lenses until she is completed these antibiotics. Patient stated understanding he was comfortable with plan to be discharged home in good condition.        Cyndra Numbers, MD 10/09/11 [phone]

## 2011-10-27 ENCOUNTER — Ambulatory Visit (INDEPENDENT_AMBULATORY_CARE_PROVIDER_SITE_OTHER): Payer: Medicaid Other | Admitting: Obstetrics & Gynecology

## 2011-10-27 ENCOUNTER — Other Ambulatory Visit (HOSPITAL_COMMUNITY)
Admission: RE | Admit: 2011-10-27 | Discharge: 2011-10-27 | Disposition: A | Discharge status: Home / Self Care | Payer: Medicaid Other | Source: Ambulatory Visit | Attending: Obstetrics & Gynecology | Admitting: Obstetrics & Gynecology

## 2011-10-27 ENCOUNTER — Encounter: Payer: Self-pay | Admitting: Obstetrics & Gynecology

## 2011-10-27 DIAGNOSIS — N920 Excessive and frequent menstruation with regular cycle: Secondary | ICD-10-CM

## 2011-10-27 DIAGNOSIS — R87615 Unsatisfactory cytologic smear of cervix: Secondary | ICD-10-CM

## 2011-10-27 DIAGNOSIS — R87810 Cervical high risk human papillomavirus (HPV) DNA test positive: Secondary | ICD-10-CM | POA: Insufficient documentation

## 2011-10-27 DIAGNOSIS — IMO0002 Reserved for concepts with insufficient information to code with codable children: Secondary | ICD-10-CM

## 2011-10-27 DIAGNOSIS — Z01419 Encounter for gynecological examination (general) (routine) without abnormal findings: Secondary | ICD-10-CM | POA: Insufficient documentation

## 2011-10-27 NOTE — Progress Notes (Signed)
50 yo P3 (three prior cesarean sections) with menorrhagia and morbid obesity here for endometrial biopsy for evaluation of menorrhagia.  Pelvic u/s showed adenomyosis and a small fibroid.  Patient is on Megace currently, this is controlling her menorrhagia.  No other concerns.  Of note, pap in 10/11 was inadequate and will be repeated today.  The following portions of the patient's history were reviewed and updated as appropriate: allergies, current medications, past family history, past medical history, past social history, past surgical history and problem list.  ENDOMETRIAL BIOPSY     The indications for endometrial biopsy were reviewed.   Risks of the biopsy including cramping, bleeding, infection, uterine perforation, inadequate specimen and need for additional procedures  were discussed. The patient states she understands and agrees to undergo procedure today. Consent was signed. Time out was performed. Urine HCG was negative. A sterile speculum was placed in the patient's vagina and the cervix was visualized.  A pap smear was done.  The cervix was then prepped with Betadine. A single-toothed tenaculum was placed on the anterior lip of the cervix to stabilize it. The 3 mm pipelle was introduced into the endometrial cavity without difficulty to a depth of 9 cm, and a moderate amount of tissue was obtained and sent to pathology. The instruments were removed from the patient's vagina. Minimal bleeding from the cervix was noted. The patient tolerated the procedure well. Routine post-procedure instructions were given to the patient. The patient will follow up to review the results and for further management.     Will follow up results and continue to monitor bleeding for now.  Patient to return to two weeks to discuss results and further management.

## 2011-11-04 ENCOUNTER — Ambulatory Visit
Admission: RE | Admit: 2011-11-04 | Discharge: 2011-11-04 | Disposition: A | Discharge status: Home / Self Care | Payer: Medicaid Other | Source: Ambulatory Visit | Attending: Obstetrics & Gynecology | Admitting: Obstetrics & Gynecology

## 2011-11-04 DIAGNOSIS — Z1231 Encounter for screening mammogram for malignant neoplasm of breast: Secondary | ICD-10-CM

## 2011-11-11 ENCOUNTER — Encounter: Payer: Self-pay | Admitting: Obstetrics & Gynecology

## 2011-11-11 ENCOUNTER — Ambulatory Visit (INDEPENDENT_AMBULATORY_CARE_PROVIDER_SITE_OTHER): Payer: Medicaid Other | Admitting: Obstetrics & Gynecology

## 2011-11-11 DIAGNOSIS — N949 Unspecified condition associated with female genital organs and menstrual cycle: Secondary | ICD-10-CM

## 2011-11-11 DIAGNOSIS — G8929 Other chronic pain: Secondary | ICD-10-CM

## 2011-11-11 DIAGNOSIS — R87610 Atypical squamous cells of undetermined significance on cytologic smear of cervix (ASC-US): Secondary | ICD-10-CM

## 2011-11-11 DIAGNOSIS — Z01818 Encounter for other preprocedural examination: Secondary | ICD-10-CM

## 2011-11-11 DIAGNOSIS — N920 Excessive and frequent menstruation with regular cycle: Secondary | ICD-10-CM

## 2011-11-11 NOTE — Progress Notes (Signed)
History:  50 yo P3 (three prior cesarean sections) with menorrhagia and morbid obesity here for follow up endometrial biopsy results and management menorrhagia.  Pelvic u/s showed adenomyosis and a small 2.6 cm fibroid. Patient is on Megace currently, this is controlling her menorrhagia.  Furthermore, patient had a pap smear during her last visit that showed ASCUS +HPV.  She is here for colposcopy.  The following portions of the patient's history were reviewed and updated as appropriate: allergies, current medications, past family history, past medical history, past social history, past surgical history and problem list.   Objective:  Physical Exam Blood pressure 132/96, pulse 77, height 5\' 1"  (1.549 m), weight 284 lb (128.822 kg). Gen: NAD Abd: Soft, nontender and nondistended Pelvic: Normal appearing external genitalia; normal appearing vaginal mucosa and cervix.  Normal discharge.  Small uterus, no palpable masses or adnexal tenderness.  Colposcopy Patient given informed consent, signed copy in the chart, time out was performed. Urine pregnancy test was negative.  Placed in lithotomy position. Cervix viewed with speculum and colposcope after application of acetic acid.  Colposcopy adequate?  Yes No Acetowhite lesion, Punctation, Mosaicism, Abnormal vasculature Biopsies?No ECC?Yes  Labs and Imaging 09/10/11 TRANSABDOMINAL AND TRANSVAGINAL ULTRASOUND OF PELVIS Findings: Uterus: Demonstrates a sagittal length of 9.4 cm, AP depth of 4.8 cm and a transverse width of 4.5 cm. A focal fibroid is identified in the anterior lower uterine segment measuring 2.6 x 2.1 x 2.4 cm. This is mural in location. Mild diffuse heterogeneity of the myometrium is otherwise seen with no other focal fibroids identified. No areas of focal thickening or heterogeneity are identified. No focal mural thickening, mural cysts or loss of the myometrial endometrial interface is identified to otherwise suggest adenomyosis.    Endometrium: Is thin and echogenic with an AP width of 5.5 mm.  Right ovary: Measures 2.1 by 2.4 by 2.1 cm and appears poorly resolved endovaginally due to scanning parameters. No definite focal parenchymal abnormality is identified.  Left ovary: Is well seen only transabdominally and has a normal appearance measuring 2.7 by 1.1 x 2.3 cm  Other findings: No pelvic fluid or separate adnexal masses are seen.  IMPRESSION: Focal fibroid with size and location as noted above. Otherwise mild diffuse myometrial heterogeneity which can be seen with adenomyosis although no other distinguishing features of this process are evident.  Normal endometrial lining and left ovary. No obvious right ovarian abnormality with poor resolution possible.   10/27/11 Endometrial biopsy WEAKLY PROLIFERATIVE ENDOMETRIUM, BENIGN ENDOCERVIX WITH ABUNDANT BLOOD AND MUCUS. NO HYPERPLASIA OR CARCINOMA.  10/27/11 Pap Smear ASCUS, +HPV  11/05/2011  DG SCREEN MAMMOGRAM BILATERAL Bilateral CC and MLO view(s) were taken.  DIGITAL SCREENING MAMMOGRAM WITH CAD: There are scattered fibroglandular densities.  No masses or malignant type calcifications are  identified.  Compared with prior studies.  Images were processed with CAD.  IMPRESSION: No specific mammographic evidence of malignancy.  Next screening mammogram is recommended in one  year.  A result letter of this screening mammogram will be mailed directly to the patient.  ASSESSMENT: Negative - BI-RADS 1  Screening mammogram in 1 year. ,    Assessment & Plan:  Will follow up colposcopy results and manage accordingly.  For her cervical dysplasia, patient needs pap smears every six months until she has two consecutive normal pap smears, then she can resume annual screening. If any pap smear is abnormal, she will need repeat colposcopy and appropriate evaluation.  With regards to her menorrhagia,  We discussed management  options for menorrhagia including oral progesterone, Depo Provera,  Mirena IUD, endometrial ablation (Novasure/Hydrothermal Ablation/Thermachoice) or hysterectomy as definitive surgical management.  Discussed risks and benefits of each method. Patient desires Novasure ablation.  Will proceed with Hysteroscopy, D&C and Novasure endometrial ablation.  Risks of surgery were discussed with the patient including but not limited to: bleeding which may require transfusion; infection which may require antibiotics; injury to uterus leading to risk of injury to surrounding intraperitoneal organs, need for additional procedures including laparoscopy or laparotomy, and other postoperative/anesthesia complications.  The patient also understands the alternative treatment options which were discussed in full.  All questions were answered.  She was told that she will be contacted by our surgical scheduler regarding the time and date of her surgery; routine preoperative instructions of having nothing to eat or drink after midnight on the day prior to surgery and also coming to the hospital 1 1/2 hours prior to her time of surgery were also emphasized.  She was told she may be called for a preoperative appointment about a week prior to surgery and will be given further preoperative instructions at that visit. In the meantime, she will use Megace as needed for her menorrhagia.  Bleeding precautions were emphasized.

## 2011-11-11 NOTE — Patient Instructions (Signed)
Endometrial Ablation Endometrial ablation removes the lining of the uterus (endometrium). It is usually a same day, outpatient treatment. Ablation helps avoid major surgery (such as a hysterectomy). A hysterectomy is removal of the cervix and uterus. Endometrial ablation has less risk and complications, has a shorter recovery period and is less expensive. After endometrial ablation, most women will have little or no menstrual bleeding. You may not keep your fertility. Pregnancy is no longer likely after this procedure but if you are pre-menopausal, you still need to use a reliable method of birth control following the procedure because pregnancy can occur. REASONS TO HAVE THE PROCEDURE MAY INCLUDE:  Heavy periods.   Bleeding that is causing anemia.   Anovulatory bleeding, very irregular, bleeding.   Bleeding submucous fibroids (on the lining inside the uterus) if they are smaller than 3 centimeters.  REASONS NOT TO HAVE THE PROCEDURE MAY INCLUDE:  You wish to have more children.   You have a pre-cancerous or cancerous problem. The cause of any abnormal bleeding must be diagnosed before having the procedure.   You have pain coming from the uterus.   You have a submucus fibroid larger than 3 centimeters.   You recently had a baby.   You recently had an infection in the uterus.   You have a severe retro-flexed, tipped uterus and cannot insert the instrument to do the ablation.   You had a Cesarean section or deep major surgery on the uterus.   The inner cavity of the uterus is too large for the endometrial ablation instrument.  RISKS AND COMPLICATIONS   Perforation of the uterus.   Bleeding.   Infection of the uterus, bladder or vagina.   Injury to surrounding organs.   Cutting the cervix.   An air bubble to the lung (air embolus).   Pregnancy following the procedure.   Failure of the procedure to help the problem requiring hysterectomy.   Decreased ability to diagnose  cancer in the lining of the uterus.  BEFORE THE PROCEDURE  The lining of the uterus must be tested to make sure there is no pre-cancerous or cancer cells present.   Medications may be given to make the lining of the uterus thinner.   Ultrasound may be used to evaluate the size and look for abnormalities of the uterus.   Future pregnancy is not desired.  PROCEDURE  There are different ways to destroy the lining of the uterus.   Resectoscope - radio frequency-alternating electric current is the most common one used.   Cryotherapy - freezing the lining of the uterus.   Heated Free Liquid - heated salt (saline) solution inserted into the uterus.   Microwave - uses high energy microwaves in the uterus.   Thermal Balloon - a catheter with a balloon tip is inserted into the uterus and filled with heated fluid.  Your caregiver will talk with you about the method used in this clinic. They will also instruct you on the pros and cons of the procedure. Endometrial ablation is performed along with a procedure called operative hysteroscopy. A narrow viewing tube is inserted through the birth canal (vagina) and through the cervix into the uterus. A tiny camera attached to the viewing tube (hysteroscope) allows the uterine cavity to be shown on a TV monitor during surgery. Your uterus is filled with a harmless liquid to make the procedure easier. The lining of the uterus is then removed. The lining can also be removed with a resectoscope which allows your surgeon   to cut away the lining of the uterus under direct vision. Usually, you will be able to go home within an hour after the procedure. HOME CARE INSTRUCTIONS   Do not drive for 24 hours.   No tampons, douching or intercourse for 2 weeks or until your caregiver approves.   Rest at home for 24 to 48 hours. You may then resume normal activities unless told differently by your caregiver.   Take your temperature two times a day for 4 days, and record  it.   Take any medications your caregiver has ordered, as directed.   Use some form of contraception if you are pre-menopausal and do not want to get pregnant.  Bleeding after the procedure is normal. It varies from light spotting and mildly watery to bloody discharge for 4 to 6 weeks. You may also have mild cramping. Only take over-the-counter or prescription medicines for pain, discomfort, or fever as directed by your caregiver. Do not use aspirin, as this may aggravate bleeding. Frequent urination during the first 24 hours is normal. You will not know how effective your surgery is until at least 3 months after the surgery. SEEK IMMEDIATE MEDICAL CARE IF:   Bleeding is heavier than a normal menstrual cycle.   An oral temperature above 102 F (38.9 C) develops.   You have increasing cramps or pains not relieved with medication or develop belly (abdominal) pain which does not seem to be related to the same area of earlier cramping and pain.   You are light headed, weak or have fainting episodes.   You develop pain in the shoulder strap areas.   You have chest or leg pain.   You have abnormal vaginal discharge.   You have painful urination.  Document Released: 10/24/2004 Document Revised: 08/27/2011 Document Reviewed: 01/22/2008 [facility] Patient Information 2012 [place], [place].  Cervical Dysplasia Cervical dysplasia is a condition in which a woman has abnormal changes in the cells of her cervix. The cervix is the opening to the uterus (womb) between the vagina and the uterus. These changes are called cervical dysplasia and may be the first signs of cervical cancer. These cells can be taken from the cervix during a Pap test and then looked at under a microscope. With early detection, treatment, and close follow-up care, nearly all cervical dysplasia can be cured. If untreated, the mild to moderate stages of dysplasia often grow more severe.  RISK FACTORS  The following increase the risk  for cervical dysplasia.  Having had a sexually transmitted disease, including:   Chlamydia.   Human papilloma virus (HPV).   Becoming sexually active before age 31.   Having had more than 1 sexual partner.   Not using protection, such as condoms, during sexual intercourse, especially with new sexual partners.   Having had cancer of the vagina or vulva.   Having a sexual partner whose previous partner had cancer of the cervix or cervical dysplasia.   Having a sexual partner who has or has had cancer of the penis.   Having a weakened immune system (HIV, organ transplant).   Being the daughter of a woman who took DES (diethylstilbestrol) during pregnancy.   A history of cervical cancer in a woman's sister or mother.   Smoking.   Having had an abnormal Pap test in the past.  SYMPTOMS  There are usually no symptoms. If there are symptoms, they may be vague such as:  Abnormal vaginal discharge.   Bleeding between periods or following intercourse.  Bleeding during menopause.   Pain on intercourse (dyspareunia).  DIAGNOSIS   The Pap test is the best way of detecting abnormalities of the cervix.   Biopsy (removing a piece of tissue to look at under the microscope) of the cervix when the Pap test is abnormal or when the Pap test is normal, but the cervix looks abnormal.  TREATMENT  Catching and treating the changes early with Pap tests can prevent cervical cancer.  Cryotherapy freezes the abnormal cells with a steel tip instrument.   A laser can be used to remove the abnormal cells.   Loop electrocautery excision procedure (LEEP). This procedure uses a heated electrical loop to remove a cone-like portion of the cervix, including the cervical canal.   For more serious cases of cervical dysplasia, the abnormal tissue may be removed surgically by:   A cone biopsy (by cold knife, laser or LEEP). A procedure in which a portion of the center of the cervix with the cervical  canal is removed.   The uterus and cervix are removed (hysterectomy).  Your caregiver will advise you regarding the need and timing of Pap tests in your follow-up. Women who have been treated for dysplasia should be closely followed with pelvic exams and Pap tests. During the first year following treatment of cervical dysplasia, Pap tests should be done every 6 months, or as recommended by your caregiver. See your caregiver for new or worsening problems. HOME CARE INSTRUCTIONS   Follow the instructions and recommendations of your caregiver regarding medicines and follow-up appointments.   Only take over-the-counter or prescription medicines for pain or discomfort as directed by your caregiver.   Cramping and pelvic discomfort may follow cryotherapy. It is not abnormal to have watery discharge for several weeks after.   Laser, cone surgery, cryotherapy or LEEP can cause a bad smelling vaginal discharge. It may also cause vaginal bleeding for a couple weeks following the procedure. The discharge may be black from the paste used to control bleeding from the cone site. This is normal.   Do not use tampons, have sexual intercourse or douche until your caregiver says it is okay.  SEEK MEDICAL CARE IF:   You develop genital warts.   You need a prescription for pain medicine following your treatment.  SEEK IMMEDIATE MEDICAL CARE IF:   Your bleeding is heavier than a normal menstrual period.   You develop bright red bleeding, especially if you have blood clots.   You have a fever.   You have increasing cramps or pain not relieved with medicine.   You are lightheaded, unusually weak, or have fainting spells.   You have abnormal vaginal discharge.   You develop abdominal pain.  PREVENTION   The surest way to prevent cervical dysplasia is to abstain from sexual intercourse.   Practice safe sex, use condoms and have only one sex partner who does not have other sex partners.   A Pap test  is done to screen for cervical cancer.   The first Pap test should be done at age 50.   Between ages 43 and 80, Pap tests are repeated every 2 years.   Beginning at age 28, you are advised to have a Pap test every 3 years as long as your past 3 Pap tests have been normal.   Some women have medical problems that increase the chance of getting cervical cancer. Talk to your caregiver about these problems. It is especially important to talk to your caregiver if a  new problem develops soon after your last Pap test. In these cases, your caregiver may recommend more frequent screening and Pap tests.   The above recommendations are the same for women who have or have not gotten the vaccine for HPV (Human Papillomavirus).   If you had a hysterectomy for a problem that was not a cancer or a condition that could lead to cancer, then you no longer need Pap tests. However, even if you no longer need a Pap test, a regular exam is a good idea to make sure no other problems are starting.    If you are between ages 56 and 35, and you have had normal Pap tests going back 10 years, you no longer need Pap tests. However, even if you no longer need a Pap test, a regular exam is a good idea to make sure no other problems are starting.    If you have had past treatment for cervical cancer or a condition that could lead to cancer, you need Pap tests and screening for cancer for at least 20 years after your treatment.   If Pap tests have been discontinued, risk factors (such as a new sexual partner) need to be re-assessed to determine if screening should be resumed.   Some women may need screenings more often if they are at high risk for cervical cancer.   Your caregiver may do additional tests including:   Colposcopy. A procedure in which a special microscope magnifies the cells and allows the provider to closely examine the cervix, vagina, and vulva.   Biopsy. A small tissue sample is taken from the cervix,  vagina or vulva. This is generally done in your caregivers office.   A cone biopsy (cold knife or laser). A large tissue sample is taken from the cervix. This procedure is usually done in an operating room under a general anesthetic. The cone often removes all abnormal tissue and so may also complete the treatment.   LEEP, also removing a circular portion of the cervix and is done in a doctors office under a local anesthetic.   Now there is a vaccine, Gardasil, that was developed to prevent the HPV'S that can cause cancer of the cervix and genital warts. It is recommended for females ages 60 to 69. It should not be given to pregnant women until more is known about its effects on the fetus. Not all cancers of the cervix are caused by the HPV. Routine gynecology exams and Pap tests should continue as recommended by your caregiver.  Document Released: 12/15/2005 Document Revised: 08/27/2011 Document Reviewed: 12/06/2008 [facility] Patient Information 2012 [place], [place].

## 2011-11-17 ENCOUNTER — Other Ambulatory Visit: Payer: Self-pay | Admitting: Family Medicine

## 2011-11-18 NOTE — Telephone Encounter (Signed)
Refill request

## 2011-12-08 NOTE — Telephone Encounter (Signed)
Sarah, I can't close this encounter because there are some other unsigned items affixed.  Please review and close

## 2011-12-10 ENCOUNTER — Encounter (HOSPITAL_COMMUNITY): Payer: Self-pay

## 2011-12-15 ENCOUNTER — Ambulatory Visit (INDEPENDENT_AMBULATORY_CARE_PROVIDER_SITE_OTHER): Payer: Medicaid Other | Admitting: Family Medicine

## 2011-12-15 ENCOUNTER — Encounter: Payer: Self-pay | Admitting: Family Medicine

## 2011-12-15 VITALS — BP 146/89 | HR 86 | Temp 98.1°F | Ht 61.0 in | Wt 290.0 lb

## 2011-12-15 DIAGNOSIS — I10 Essential (primary) hypertension: Secondary | ICD-10-CM

## 2011-12-15 DIAGNOSIS — Z23 Encounter for immunization: Secondary | ICD-10-CM

## 2011-12-15 DIAGNOSIS — R600 Localized edema: Secondary | ICD-10-CM

## 2011-12-15 DIAGNOSIS — N760 Acute vaginitis: Secondary | ICD-10-CM

## 2011-12-15 DIAGNOSIS — N898 Other specified noninflammatory disorders of vagina: Secondary | ICD-10-CM

## 2011-12-15 DIAGNOSIS — R609 Edema, unspecified: Secondary | ICD-10-CM

## 2011-12-15 LAB — POCT WET PREP (WET MOUNT)
Trichomonas Wet Prep HPF POC: NEGATIVE
Yeast Wet Prep HPF POC: NEGATIVE

## 2011-12-15 LAB — COMPLETE METABOLIC PANEL WITH GFR
Alkaline Phosphatase: 81 U/L (ref 39–117)
CO2: 20 mEq/L (ref 19–32)
Creat: 0.77 mg/dL (ref 0.50–1.10)
GFR, Est African American: >89 mL/min
GFR, Est Non African American: >89 mL/min
Glucose, Bld: 102 mg/dL — ABNORMAL HIGH (ref 70–99)
Total Bilirubin: 0.2 mg/dL — ABNORMAL LOW (ref 0.3–1.2)

## 2011-12-15 LAB — LIPID PANEL
HDL: 55 mg/dL (ref >39)
LDL Cholesterol: 97 mg/dL (ref 0–99)
Total CHOL/HDL Ratio: 3 Ratio
Triglycerides: 69 mg/dL (ref <150)

## 2011-12-15 MED ORDER — METRONIDAZOLE 0.75 % VA GEL: 1.0000 | Freq: Two times a day (BID) | VAGINAL | Status: AC

## 2011-12-15 NOTE — Assessment & Plan Note (Signed)
Wet prep and exam consistent with BV. will treat with course of MetroGel. Patient agrees with plan

## 2011-12-15 NOTE — Patient Instructions (Signed)
It was nice to see you today.   We will order an ultrasound of your leg to make sure there is no clot there. I would suggest that if you feel like you are about to have an outbreak, you take the Valtrex for 3 days.  You should take 1 pill twice a day for 3 days.

## 2011-12-15 NOTE — Assessment & Plan Note (Signed)
After reviewing time course of antidepressant and increased weight, and patient agrees that it is unlikely caused by the Wellbutrin. Megace may be contributing to her weight gain. Patient hoping to stop Megace after D&C. Can evaluate weight at that time. Patient with morbid obesity and has been unsuccessful with weight loss attempts in the past. Surgery might be beneficial for this patient.

## 2011-12-15 NOTE — Progress Notes (Signed)
  Subjective:    Patient ID: Jessica [name], female    DOB: Oct 21, 1961, 50 y.o.   MRN: [id number]  HPI Jessica [name] is here to discuss several issues. #1 she noticed a vaginal discharge starting last weekend. She is itching. She has not noticed an odor. She has not been sexually active for 5-6 months. She has been checked for STIs since then. The discharge is thick. She plans to have an ablation of her uterus the day after Christmas. Her right leg has been giving out. This has been going on for one week. She has fallen in the shower twice. Last fall was yesterday. She has had pain in this leg for many months. She has also had swelling in the  leg. The swelling is worse when she's sitting. The pain is in her calf and throbbing. It is improved when she props her leg up. She has tried ibuprofen and other pain meds without relief. She did try OxyContin which did help. She does notwant to continue taking long-acting narcotics. #3 obesity. Patient has noticed increasing weight in the last few months. She reports she has no change in her activity or nutrition. She's been on wellbutrin for more than one year which helped her mood but she was concerned this was causing her weight gain so she stopped it one week ago. She has also been on Megace for approximately 3 months.     Review of Systems CHPI      Objective:   Physical Exam  Nursing note and vitals reviewed. Constitutional:       Obese.  No distress  Genitourinary:       Nl external female genitalia. Nl vagina, well rugated.  Thin vaginal discharge noted.  Exam limited by body habitus.  Musculoskeletal:       BLE without edema or TTP or erythema or warmth.  B knees stable ant/post/lat/ medially.  Neg McMurray's sign.  No crepitus.            Assessment & Plan:

## 2011-12-15 NOTE — Assessment & Plan Note (Signed)
Concerning for potential clot in length. Will check large from the ultrasound. Followup once results are available.

## 2011-12-17 ENCOUNTER — Emergency Department (INDEPENDENT_AMBULATORY_CARE_PROVIDER_SITE_OTHER): Payer: Medicaid Other

## 2011-12-17 ENCOUNTER — Other Ambulatory Visit: Payer: Self-pay

## 2011-12-17 ENCOUNTER — Emergency Department (HOSPITAL_BASED_OUTPATIENT_CLINIC_OR_DEPARTMENT_OTHER)
Admission: EM | Admit: 2011-12-17 | Discharge: 2011-12-18 | Disposition: A | Discharge status: Home / Self Care | Payer: Medicaid Other | Attending: Emergency Medicine | Admitting: Emergency Medicine

## 2011-12-17 ENCOUNTER — Encounter (HOSPITAL_BASED_OUTPATIENT_CLINIC_OR_DEPARTMENT_OTHER): Payer: Self-pay | Admitting: *Deleted

## 2011-12-17 DIAGNOSIS — F3289 Other specified depressive episodes: Secondary | ICD-10-CM | POA: Insufficient documentation

## 2011-12-17 DIAGNOSIS — Z79899 Other long term (current) drug therapy: Secondary | ICD-10-CM | POA: Insufficient documentation

## 2011-12-17 DIAGNOSIS — R079 Chest pain, unspecified: Secondary | ICD-10-CM

## 2011-12-17 DIAGNOSIS — G43909 Migraine, unspecified, not intractable, without status migrainosus: Secondary | ICD-10-CM | POA: Insufficient documentation

## 2011-12-17 DIAGNOSIS — J9819 Other pulmonary collapse: Secondary | ICD-10-CM

## 2011-12-17 DIAGNOSIS — R0602 Shortness of breath: Secondary | ICD-10-CM

## 2011-12-17 DIAGNOSIS — N644 Mastodynia: Secondary | ICD-10-CM | POA: Insufficient documentation

## 2011-12-17 DIAGNOSIS — I10 Essential (primary) hypertension: Secondary | ICD-10-CM | POA: Insufficient documentation

## 2011-12-17 DIAGNOSIS — M7989 Other specified soft tissue disorders: Secondary | ICD-10-CM | POA: Insufficient documentation

## 2011-12-17 DIAGNOSIS — F329 Major depressive disorder, single episode, unspecified: Secondary | ICD-10-CM | POA: Insufficient documentation

## 2011-12-17 LAB — COMPREHENSIVE METABOLIC PANEL
BUN: 9 mg/dL (ref 6–23)
CO2: 24 mEq/L (ref 19–32)
Chloride: 99 mEq/L (ref 96–112)
Creatinine, Ser: 0.8 mg/dL (ref 0.50–1.10)
GFR calc non Af Amer: 86 mL/min — ABNORMAL LOW (ref >90)
Total Bilirubin: 0.1 mg/dL — ABNORMAL LOW (ref 0.3–1.2)

## 2011-12-17 LAB — CARDIAC PANEL(CRET KIN+CKTOT+MB+TROPI)
CK, MB: 1.5 ng/mL (ref 0.3–4.0)
Total CK: 113 U/L (ref 7–177)

## 2011-12-17 LAB — CBC
Hemoglobin: 13.2 g/dL (ref 12.0–15.0)
RBC: 4.29 MIL/uL (ref 3.87–5.11)

## 2011-12-17 NOTE — ED Notes (Signed)
Pt reports sudden onset of midsternal CP that radiate to right shoulder and around the breast. Associated with SOB and diaphoresis. Intermittent. Described as sharp and stabbing. Denies recent cough/illness. Was vacuuming at the time of event, but denies it being too strenuous.

## 2011-12-17 NOTE — ED Notes (Signed)
C/o sudden onset of CP with SOB while vacuuming.

## 2011-12-17 NOTE — ED Notes (Signed)
Dr. Plunkett at bedside.  

## 2011-12-17 NOTE — ED Notes (Signed)
EKG given to Dr Ignacia Palma, no acute changes, no new orders rec'd.

## 2011-12-17 NOTE — ED Provider Notes (Signed)
9:16 PM  Date: 12/17/2011  Rate:82  Rhythm: normal sinus rhythm  QRS Axis: normal  Intervals: QT prolonged  ST/T Wave abnormalities: nonspecific T wave changes  Conduction Disutrbances:none  Narrative Interpretation: Abnormal EKG  Old EKG Reviewed: unchanged    Carleene Cooper III, MD 12/17/11 2118

## 2011-12-18 ENCOUNTER — Encounter: Payer: Self-pay | Admitting: Family Medicine

## 2011-12-18 ENCOUNTER — Emergency Department (INDEPENDENT_AMBULATORY_CARE_PROVIDER_SITE_OTHER): Payer: Medicaid Other

## 2011-12-18 DIAGNOSIS — R079 Chest pain, unspecified: Secondary | ICD-10-CM

## 2011-12-18 DIAGNOSIS — R0602 Shortness of breath: Secondary | ICD-10-CM

## 2011-12-18 LAB — CARDIAC PANEL(CRET KIN+CKTOT+MB+TROPI): CK, MB: 1.4 ng/mL (ref 0.3–4.0)

## 2011-12-18 MED ORDER — DIPHENHYDRAMINE HCL 50 MG/ML IJ SOLN
25.0000 mg | Freq: Once | INTRAMUSCULAR | Status: AC
  Administered 2011-12-18: 50 mg via INTRAVENOUS
  Filled 2011-12-18: qty 1

## 2011-12-18 MED ORDER — METOCLOPRAMIDE HCL 5 MG/ML IJ SOLN
10.0000 mg | Freq: Once | INTRAMUSCULAR | Status: AC
  Administered 2011-12-18: 10 mg via INTRAVENOUS
  Filled 2011-12-18: qty 2

## 2011-12-18 MED ORDER — ONDANSETRON HCL 4 MG/2ML IJ SOLN
4.0000 mg | Freq: Once | INTRAMUSCULAR | Status: AC
  Administered 2011-12-18: 4 mg via INTRAVENOUS
  Filled 2011-12-18: qty 2

## 2011-12-18 MED ORDER — IOHEXOL 350 MG/ML SOLN
80.0000 mL | Freq: Once | INTRAVENOUS | Status: AC | PRN
  Administered 2011-12-18: 80 mL via INTRAVENOUS

## 2011-12-18 MED ORDER — MORPHINE SULFATE 4 MG/ML IJ SOLN
4.0000 mg | Freq: Once | INTRAMUSCULAR | Status: AC
  Administered 2011-12-18: 4 mg via INTRAVENOUS
  Filled 2011-12-18: qty 1

## 2011-12-18 MED ORDER — DEXAMETHASONE SODIUM PHOSPHATE 10 MG/ML IJ SOLN
10.0000 mg | Freq: Once | INTRAMUSCULAR | Status: AC
  Administered 2011-12-18: 10 mg via INTRAVENOUS
  Filled 2011-12-18: qty 1

## 2011-12-18 NOTE — ED Notes (Signed)
Dr Anitra Lauth at bedside discussing test results.

## 2011-12-18 NOTE — ED Provider Notes (Signed)
History     CSN: [id number] Arrival date & time: 12/17/2011 10:03 PM   First MD Initiated Contact with Patient 12/17/11 2333      Chief Complaint  Patient presents with  . Chest Pain  . Shortness of Breath    (Consider location/radiation/quality/duration/timing/severity/associated sxs/prior treatment) Patient is a 50 y.o. female presenting with chest pain. The history is provided by the patient.  Chest Pain The chest pain began 1 - 2 hours ago. Duration of episode(s) is 10 minutes. Chest pain occurs intermittently. The chest pain is improving. The pain is associated with eating. At its most intense, the pain is at 8/10. The pain is currently at 3/10. The severity of the pain is moderate. The quality of the pain is described as pleuritic, sharp and stabbing. Radiates to: Right breast. Exacerbated by: Nothing. Pertinent negatives for primary symptoms include no fever, no syncope, no shortness of breath, no cough, no palpitations, no abdominal pain, no nausea, no vomiting and no dizziness.  Pertinent negatives for associated symptoms include no diaphoresis. Associated symptoms comments: Intermittent right lower leg swelling for the last year. She tried nothing for the symptoms. Risk factors: New oral contraceptive use or hormone replacement therapy.  Her past medical history is significant for hypertension.  Pertinent negatives for past medical history include no diabetes and no hyperlipidemia.  Her family medical history is significant for heart disease in family.     Past Medical History  Diagnosis Date  . Depression   . Hypertension   . Obesity     Past Surgical History  Procedure Date  . Neck surgery   . Tubal ligation     Family History  Problem Relation Age of Onset  . Diabetes Mother   . Diabetes Father   . Diabetes Sister   . Cancer Paternal Aunt     cevical  . Cancer Paternal Aunt     cervical    History  Substance Use Topics  . Smoking status: Never Smoker     . Smokeless tobacco: Not on file  . Alcohol Use: Yes     social    OB History    Grav Para Term Preterm Abortions TAB SAB Ect Mult Living                  Review of Systems  Constitutional: Negative for fever and diaphoresis.  Respiratory: Negative for cough and shortness of breath.   Cardiovascular: Positive for chest pain. Negative for palpitations and syncope.  Gastrointestinal: Negative for nausea, vomiting and abdominal pain.  Neurological: Positive for headaches. Negative for dizziness.       She has a history of migraines and states over the last week she ran out of her Imitrex and so she's had a persistent headache for the last week  All other systems reviewed and are negative.    Allergies  Ace inhibitors; Angiotensin receptor blockers; Lisinopril; and Latex  Home Medications   Current Outpatient Rx  Name Route Sig Dispense Refill  . ACETAMINOPHEN 500 MG PO TABS Oral Take 1,000 mg by mouth every 6 (six) hours as needed. For pain     . BUPROPION HCL ER (XL) 300 MG PO TB24  TAKE 1 TABLET BY MOUTH DAILY 90 tablet 3  . [facility] STOOL SOFTENER 100 MG PO CAPS  TAKE 1 CAPSULE BY MOUTH 2 TIMES A DAY 60 capsule 1  . CYCLOBENZAPRINE HCL 10 MG PO TABS Oral Take 1 tablet (10 mg total) by mouth 2 (two)  times daily as needed for muscle spasms. For back spasms  30 tablet 6  . HYDROCHLOROTHIAZIDE 25 MG PO TABS  TAKE 1 TABLET BY MOUTH ONCE DAILY 30 tablet 3  . IBUPROFEN 800 MG PO TABS Oral Take 800 mg by mouth every 8 (eight) hours as needed. For pain     . TOPIRAMATE 25 MG PO TABS  TAKE 1 TABLET TWICE DAILY 60 tablet 2  . ZOLPIDEM TARTRATE 5 MG PO TABS Oral Take 5 mg by mouth at bedtime as needed. For sleep     . FLUTICASONE PROPIONATE 50 MCG/ACT NA SUSP Nasal 2 sprays by Nasal route daily. 16 g 2  . METRONIDAZOLE 0.75 % VA GEL Vaginal Place 1 Applicatorful vaginally 2 (two) times daily. For 5 days 70 g 0  . VALACYCLOVIR HCL 500 MG PO TABS  TAKE 1 TABLET BY MOUTH EVERY DAY 90 tablet 3     BP 131/70  Pulse 78  Temp(Src) 99.1 F (37.3 C) (Oral)  Resp 18  Ht 5\' 1"  (1.549 m)  Wt 275 lb (124.739 kg)  BMI 51.96 kg/m2  SpO2 99%  Physical Exam  Nursing note and vitals reviewed. Constitutional: She is oriented to person, place, and time. She appears well-developed and well-nourished. No distress.  HENT:  Head: Normocephalic and atraumatic.  Mouth/Throat: Oropharynx is clear and moist.  Eyes: EOM are normal. Pupils are equal, round, and reactive to light.  Neck: Normal range of motion. Neck supple.  Cardiovascular: Normal rate, regular rhythm, normal heart sounds and intact distal pulses.  Exam reveals no friction rub.   No murmur heard. Pulmonary/Chest: Effort normal and breath sounds normal. She has no wheezes. She has no rales. She exhibits no tenderness.  Abdominal: Soft. Bowel sounds are normal. She exhibits no distension. There is no tenderness. There is no rebound and no guarding.  Musculoskeletal: Normal range of motion. She exhibits no tenderness.       No edema  Neurological: She is alert and oriented to person, place, and time. She has normal strength. No cranial nerve deficit or sensory deficit. Gait normal.       Mild photophobia  Skin: Skin is warm and dry. No rash noted.  Psychiatric: She has a normal mood and affect. Her behavior is normal.    ED Course  Procedures (including critical care time)  Labs Reviewed  COMPREHENSIVE METABOLIC PANEL - Abnormal; Notable for the following:    Potassium 3.1 (*)    Albumin 3.4 (*)    Total Bilirubin 0.1 (*)    GFR calc non Af Amer 86 (*)    All other components within normal limits  CBC  CARDIAC PANEL(CRET KIN+CKTOT+MB+TROPI)  CARDIAC PANEL(CRET KIN+CKTOT+MB+TROPI)   Dg Chest 2 View  12/17/2011  *RADIOLOGY REPORT*  Clinical Data: Chest pain, shortness of breath.  CHEST - 2 VIEW  Comparison: 03/06/2010 CT  Findings: Hypoaeration results in interstitial crowding.  Minimal lung base atelectasis.  Otherwise,  no focal consolidation, pleural effusion, or pneumothorax.  No acute osseous abnormality.  Surgical clips right upper quadrant.  IMPRESSION: Hypoaeration results in interstitial crowding.  Mild bibasilar atelectasis.  Otherwise, no acute process.  Original Report Authenticated By: Waneta Martins, M.D.   Ct Angio Chest W/cm &/or Wo Cm  12/18/2011  *RADIOLOGY REPORT*  Clinical Data:  Chest pain, shortness of breath, evaluate for PE  CT ANGIOGRAPHY CHEST WITH CONTRAST  Technique:  Multidetector CT imaging of the chest was performed using the standard protocol during bolus administration of  intravenous contrast.  Multiplanar CT image reconstructions including MIPs were obtained to evaluate the vascular anatomy.  Contrast: 80mL OMNIPAQUE IOHEXOL 350 MG/ML IV SOLN  Comparison:  Chest radiograph dated 12/17/2011  Findings:  No evidence of pulmonary embolism.  The lungs are clear.  No suspicious pulmonary nodules.  No pleural effusion or pneumothorax. The visualized thyroid is unremarkable.  The heart is top normal in size.  No pericardial effusion.  No suspicious mediastinal, hilar, or axillary lymphadenopathy.  Visualized upper abdomen is unremarkable.  Degenerative changes of the visualized thoracolumbar spine.  Review of the MIP images confirms the above findings.  IMPRESSION: No evidence of pulmonary embolism.  No evidence of acute cardiopulmonary disease.  Original Report Authenticated By: Charline Bills, M.D.    Date: 12/18/2011  Rate: 82  Rhythm: normal sinus rhythm  QRS Axis: normal  Intervals: QT prolonged  ST/T Wave abnormalities: nonspecific T wave changes  Conduction Disutrbances:none  Narrative Interpretation:   Old EKG Reviewed: unchanged    1. Chest pain   2. Migraine       MDM   Pt with atypical story for CP that started hour after she ate [employer] and a medium vacuuming. It is a sharp sensation in the midsternal area in the right breast.  She denies ever having pain like  this before and has not history of any abdominal issues or gallstones. On exam currently she states she is having pain which is coming and going but it started about an hour to 2 hours before her arrival here. TIMI 0 and only risk factor is hypertension and she states her mother had a heart attack in her 6s.  Will concern for cardiac cause of chest pain based on first worry and symptoms. However concern for possible PE given that patient states she was recently seen by her doctor to 2 right leg swelling and some mild pain which has been intermittent for the last several months but she has never had an evaluation to this point. On exam there is no calf pain or leg swelling at this time but given her history will get a CT to rule out PE. She denies any infectious symptoms. EKG within normal limits.  CXR, CBC, BMP, CE (0, 3 hrs) all within normal limits. CT of the chest negative for pulmonary embolism. After 2 mg of morphine her pain had completely resolved. Also place the patient complaining of migraine headache. She has a long history of migraines and takes Topamax daily to prevent migraines but she also ran out of her Imitrex and has had a bad headache for the last few days. This headache is very typical of her normal migraines after headache cocktail her headache had completely resolved. Will have patient followup with her PCP if chest pain continues feel is most likely due to esophageal spasm today. Patient is up and ambulating without having any chest pain and we will DC home.        Gwyneth Sprout, MD 12/18/11 [phone]

## 2011-12-18 NOTE — ED Notes (Signed)
Patient transported to CT 

## 2011-12-18 NOTE — ED Notes (Signed)
Pt states her HA is completely resolved. Denies CP at this time as well.

## 2011-12-18 NOTE — ED Notes (Signed)
Pt mentioned having a HA for approx the past week. Hx of migraines and states she ran out of her Imitrex. HA 8/10 at this time. MD made aware.

## 2011-12-18 NOTE — Patient Instructions (Addendum)
   Your procedure is scheduled on: Wednesday, Dec 26th  Enter through the [employer] of [facility] at: 11:30am Pick up the phone at the desk and dial [phone] and inform us of your arrival.  Please call this number if you have any problems the morning of surgery: [phone]  Remember: Do not eat food after midnight: Tuesday Do not drink clear liquids after: 9:00am Wednesday Take these medicines the morning of surgery with a SIP OF WATER: per anesthesia instructions  Do not wear jewelry, make-up, or FINGER nail polish Do not wear lotions, powders, perfumes or deodorant. Do not shave 48 hours prior to surgery. Do not bring valuables to the hospital.  Patients discharged on the day of surgery will not be allowed to drive home.   Home with Daughter Jessica [name] or Jessica [name]  Remember to use your hibiclens as instructed.Please shower with 1/2 bottle the evening before your surgery and the other 1/2 bottle the morning of surgery.

## 2011-12-19 ENCOUNTER — Ambulatory Visit (INDEPENDENT_AMBULATORY_CARE_PROVIDER_SITE_OTHER): Payer: Medicaid Other | Admitting: Family Medicine

## 2011-12-19 ENCOUNTER — Encounter (HOSPITAL_COMMUNITY): Payer: Self-pay

## 2011-12-19 ENCOUNTER — Encounter (HOSPITAL_COMMUNITY)
Admission: RE | Admit: 2011-12-19 | Discharge: 2011-12-19 | Disposition: A | Discharge status: Home / Self Care | Payer: Medicaid Other | Source: Ambulatory Visit | Attending: Obstetrics & Gynecology | Admitting: Obstetrics & Gynecology

## 2011-12-19 ENCOUNTER — Ambulatory Visit: Payer: Medicaid Other | Admitting: Family Medicine

## 2011-12-19 DIAGNOSIS — G43909 Migraine, unspecified, not intractable, without status migrainosus: Secondary | ICD-10-CM

## 2011-12-19 DIAGNOSIS — F329 Major depressive disorder, single episode, unspecified: Secondary | ICD-10-CM

## 2011-12-19 DIAGNOSIS — I10 Essential (primary) hypertension: Secondary | ICD-10-CM

## 2011-12-19 HISTORY — DX: Insomnia, unspecified: G47.00

## 2011-12-19 HISTORY — DX: Herpesviral infection, unspecified: B00.9

## 2011-12-19 LAB — CBC
HCT: 39.1 % (ref 36.0–46.0)
MCV: 92.9 fL (ref 78.0–100.0)
Platelets: 358 10*3/uL (ref 150–400)
RBC: 4.21 MIL/uL (ref 3.87–5.11)
WBC: 12.9 10*3/uL — ABNORMAL HIGH (ref 4.0–10.5)

## 2011-12-19 LAB — BASIC METABOLIC PANEL
CO2: 27 mEq/L (ref 19–32)
Chloride: 103 mEq/L (ref 96–112)
Creatinine, Ser: 0.69 mg/dL (ref 0.50–1.10)
Potassium: 3.2 mEq/L — ABNORMAL LOW (ref 3.5–5.1)

## 2011-12-19 MED ORDER — HYDROCHLOROTHIAZIDE 25 MG PO TABS: 25.0000 mg | ORAL_TABLET | Freq: Every day | ORAL | Status: DC

## 2011-12-19 MED ORDER — ACETAMINOPHEN 500 MG PO TABS: 1000.0000 mg | ORAL_TABLET | Freq: Four times a day (QID) | ORAL | Status: DC | PRN

## 2011-12-19 MED ORDER — IBUPROFEN 800 MG PO TABS: 800.0000 mg | ORAL_TABLET | Freq: Three times a day (TID) | ORAL | Status: DC | PRN

## 2011-12-19 MED ORDER — BUPROPION HCL ER (XL) 300 MG PO TB24: 300.0000 mg | ORAL_TABLET | ORAL | Status: DC

## 2011-12-19 MED ORDER — PROMETHAZINE HCL 12.5 MG PO TABS: 12.5000 mg | ORAL_TABLET | Freq: Three times a day (TID) | ORAL | Status: DC | PRN

## 2011-12-19 NOTE — Patient Instructions (Signed)
It was nice to meet you today! I am sorry you do not feel well.  [facility], and sent in a prescription for phenergan for nausea to take as needed. (It will make you tired.)  Please rest as much as possible to help your headache. If you have any fevers, numbness, tingling, changes in vision or difficulty speaking, please go to the ED immediately.  We will fax a work note to your job.  Thank you and take care! Oberon Hehir M. Haven Pylant, M.D.

## 2011-12-19 NOTE — Pre-Procedure Instructions (Signed)
Reviewed Patient's history with Dr Rodman Pickle.  Dr Rodman Pickle saw this patient at pre-op appt.

## 2011-12-21 ENCOUNTER — Encounter: Payer: Self-pay | Admitting: Family Medicine

## 2011-12-21 NOTE — Assessment & Plan Note (Signed)
NSAID seem to help, therefore will give Toradol injection today. She has her daughter with her who can drive her home. She has N/V, will send in Rx for Phenergan 25mg  as needed for nausea. I have told her this will make her sleepy and she understands. Will give work note to clinic staff to send to her work to excuse her from work until Monday 12/24. Patient told of red flag symptoms which should prompt her return.

## 2011-12-21 NOTE — Progress Notes (Signed)
  Subjective:    Patient ID: Jessica [name], female    DOB: [birth year], 50 y.o.   MRN: [id number]  HPI Pt is a 49 yo F presenting to the office for migraine headache. She has ha history of migraines. This current headache started last week. It comes/goes. Worsened by light and loud noises, as well as stress. Relieved by rest. She takes Topamax as well as IBU with some relief, but not much. She reports nausea, and vomiting yesterday. Denies changes in vision, numbness, tingling, weakness, fever, chills, decreased hearing. She is also supposed to work over the weekend, but does not feel she is able to do this.   She is also needing refills on her chronic medications for depression and hypertension.  Past medical history, surgical history, family history and social history reviewed.  Current Outpatient Prescriptions  Medication Sig Dispense Refill  . acetaminophen (TYLENOL) 500 MG tablet Take 2 tablets (1,000 mg total) by mouth every 6 (six) hours as needed. For pain  30 tablet  2  . buPROPion (WELLBUTRIN XL) 300 MG 24 hr tablet Take 1 tablet (300 mg total) by mouth every morning.  90 tablet  3  . cyclobenzaprine (FLEXERIL) 10 MG tablet Take 1 tablet (10 mg total) by mouth 2 (two) times daily as needed for muscle spasms. For back spasms   30 tablet  6  . fluticasone (FLONASE) 50 MCG/ACT nasal spray 2 sprays by Nasal route daily.  16 g  2  . hydrochlorothiazide (HYDRODIURIL) 25 MG tablet Take 1 tablet (25 mg total) by mouth daily.  30 tablet  3  . ibuprofen (ADVIL,MOTRIN) 800 MG tablet Take 1 tablet (800 mg total) by mouth every 8 (eight) hours as needed. For pain  30 tablet  2  . metroNIDAZOLE (METROGEL VAGINAL) 0.75 % vaginal gel Place 1 Applicatorful vaginally 2 (two) times daily. For 5 days  70 g  0  . promethazine (PHENERGAN) 12.5 MG tablet Take 1 tablet (12.5 mg total) by mouth every 8 (eight) hours as needed for nausea.  20 tablet  0  . topiramate (TOPAMAX) 25 MG tablet TAKE 1 TABLET TWICE  DAILY  60 tablet  2  . valACYclovir (VALTREX) 500 MG tablet TAKE 1 TABLET BY MOUTH EVERY DAY  90 tablet  3  . zolpidem (AMBIEN) 5 MG tablet Take 5 mg by mouth at bedtime as needed. For sleep       . [facility] STOOL SOFTENER 100 MG capsule TAKE 1 CAPSULE BY MOUTH 2 TIMES A DAY  60 capsule  1     Review of Systems Negative except as mentioned in HPI     Objective:   Physical Exam  Constitutional: She appears well-developed and well-nourished. She appears distressed.  HENT:  Head: Normocephalic and atraumatic.  Mouth/Throat: Oropharynx is clear and moist.  Eyes: Pupils are equal, round, and reactive to light.  Neck: Normal range of motion.  Cardiovascular: Normal rate and regular rhythm.   No murmur heard. Pulmonary/Chest: Effort normal and breath sounds normal.  Musculoskeletal: Normal range of motion.  Lymphadenopathy:    She has no cervical adenopathy.  Neurological: She is alert. She has normal strength. No cranial nerve deficit or sensory deficit. Coordination normal.  Skin: Skin is warm and dry. No rash noted.          Assessment & Plan:

## 2011-12-24 ENCOUNTER — Other Ambulatory Visit: Payer: Self-pay | Admitting: Obstetrics & Gynecology

## 2011-12-24 ENCOUNTER — Ambulatory Visit (HOSPITAL_COMMUNITY)
Admission: RE | Admit: 2011-12-24 | Discharge: 2011-12-24 | Disposition: A | Discharge status: Home / Self Care | Payer: Medicaid Other | Source: Ambulatory Visit | Attending: Obstetrics & Gynecology | Admitting: Obstetrics & Gynecology

## 2011-12-24 ENCOUNTER — Encounter (HOSPITAL_COMMUNITY): Payer: Self-pay | Admitting: Anesthesiology

## 2011-12-24 ENCOUNTER — Ambulatory Visit (HOSPITAL_COMMUNITY): Payer: Medicaid Other | Admitting: Anesthesiology

## 2011-12-24 ENCOUNTER — Encounter (HOSPITAL_COMMUNITY)
Admission: RE | Disposition: A | Discharge status: Home / Self Care | Payer: Self-pay | Source: Ambulatory Visit | Attending: Obstetrics & Gynecology

## 2011-12-24 DIAGNOSIS — N920 Excessive and frequent menstruation with regular cycle: Secondary | ICD-10-CM | POA: Insufficient documentation

## 2011-12-24 LAB — PREGNANCY, URINE: Preg Test, Ur: NEGATIVE

## 2011-12-24 SURGERY — DILATATION & CURETTAGE/HYSTEROSCOPY WITH NOVASURE ABLATION
Anesthesia: Choice | Site: Vagina | Wound class: Clean Contaminated

## 2011-12-24 MED ORDER — ONDANSETRON HCL 4 MG/2ML IJ SOLN
INTRAMUSCULAR | Status: AC
  Filled 2011-12-24: qty 2

## 2011-12-24 MED ORDER — MIDAZOLAM HCL 2 MG/2ML IJ SOLN
INTRAMUSCULAR | Status: AC
  Filled 2011-12-24: qty 2

## 2011-12-24 MED ORDER — LACTATED RINGERS IR SOLN
Status: DC | PRN
  Administered 2011-12-24: 3000 mL

## 2011-12-24 MED ORDER — KETOROLAC TROMETHAMINE 30 MG/ML IJ SOLN
INTRAMUSCULAR | Status: DC | PRN
  Administered 2011-12-24: 30 mg via INTRAVENOUS

## 2011-12-24 MED ORDER — LIDOCAINE HCL (CARDIAC) 20 MG/ML IV SOLN
INTRAVENOUS | Status: AC
  Filled 2011-12-24: qty 5

## 2011-12-24 MED ORDER — MIDAZOLAM HCL 5 MG/5ML IJ SOLN
INTRAMUSCULAR | Status: DC | PRN
  Administered 2011-12-24: 2 mg via INTRAVENOUS

## 2011-12-24 MED ORDER — KETOROLAC TROMETHAMINE 30 MG/ML IJ SOLN: 15.0000 mg | Freq: Once | INTRAMUSCULAR | Status: DC | PRN

## 2011-12-24 MED ORDER — LACTATED RINGERS IV SOLN
INTRAVENOUS | Status: DC
  Administered 2011-12-24: 12:00:00 via INTRAVENOUS

## 2011-12-24 MED ORDER — ONDANSETRON HCL 4 MG/2ML IJ SOLN
INTRAMUSCULAR | Status: DC | PRN
  Administered 2011-12-24: 4 mg via INTRAVENOUS

## 2011-12-24 MED ORDER — PROMETHAZINE HCL 25 MG/ML IJ SOLN: 6.2500 mg | INTRAMUSCULAR | Status: DC | PRN

## 2011-12-24 MED ORDER — FENTANYL CITRATE 0.05 MG/ML IJ SOLN
INTRAMUSCULAR | Status: AC
  Filled 2011-12-24: qty 2

## 2011-12-24 MED ORDER — OXYCODONE-ACETAMINOPHEN 5-325 MG PO TABS: 1.0000 | ORAL_TABLET | Freq: Four times a day (QID) | ORAL | Status: AC | PRN

## 2011-12-24 MED ORDER — FENTANYL CITRATE 0.05 MG/ML IJ SOLN
25.0000 ug | INTRAMUSCULAR | Status: DC | PRN
  Administered 2011-12-24: 50 ug via INTRAVENOUS

## 2011-12-24 MED ORDER — PROPOFOL 10 MG/ML IV EMUL
INTRAVENOUS | Status: DC | PRN
  Administered 2011-12-24: 200 mg via INTRAVENOUS

## 2011-12-24 MED ORDER — ACETAMINOPHEN 325 MG PO TABS: 325.0000 mg | ORAL_TABLET | ORAL | Status: DC | PRN

## 2011-12-24 MED ORDER — IBUPROFEN 600 MG PO TABS
600.0000 mg | ORAL_TABLET | Freq: Four times a day (QID) | ORAL | Status: AC | PRN
Start: 2011-12-24 — End: 2012-01-03

## 2011-12-24 MED ORDER — PROPOFOL 10 MG/ML IV EMUL
INTRAVENOUS | Status: AC
  Filled 2011-12-24: qty 20

## 2011-12-24 MED ORDER — BUPIVACAINE HCL 0.5 % IJ SOLN
INTRAMUSCULAR | Status: DC | PRN
  Administered 2011-12-24: 10 mL

## 2011-12-24 MED ORDER — DOCUSATE SODIUM 100 MG PO CAPS: 100.0000 mg | ORAL_CAPSULE | Freq: Two times a day (BID) | ORAL | Status: DC | PRN

## 2011-12-24 MED ORDER — FENTANYL CITRATE 0.05 MG/ML IJ SOLN
INTRAMUSCULAR | Status: DC | PRN
  Administered 2011-12-24 (×2): 50 ug via INTRAVENOUS

## 2011-12-24 MED ORDER — KETOROLAC TROMETHAMINE 30 MG/ML IJ SOLN
INTRAMUSCULAR | Status: AC
  Filled 2011-12-24: qty 1

## 2011-12-24 MED ORDER — LIDOCAINE HCL (CARDIAC) 20 MG/ML IV SOLN
INTRAVENOUS | Status: DC | PRN
  Administered 2011-12-24 (×2): 50 mg via INTRAVENOUS

## 2011-12-24 SURGICAL SUPPLY — 12 items
ABLATOR ENDOMETRIAL BIPOLAR (ABLATOR) ×2 IMPLANT
CATH ROBINSON RED A/P 16FR (CATHETERS) ×2 IMPLANT
CLOTH BEACON ORANGE TIMEOUT ST (SAFETY) ×2 IMPLANT
CONTAINER PREFILL 10% NBF 60ML (FORM) IMPLANT
GLOVE BIO SURGEON STRL SZ7 (GLOVE) ×2 IMPLANT
GLOVE BIOGEL PI IND STRL 7.0 (GLOVE) ×2 IMPLANT
GLOVE BIOGEL PI INDICATOR 7.0 (GLOVE) ×2
GOWN PREVENTION PLUS LG XLONG (DISPOSABLE) ×4 IMPLANT
GOWN STRL REIN XL XLG (GOWN DISPOSABLE) ×2 IMPLANT
PACK HYSTEROSCOPY LF (CUSTOM PROCEDURE TRAY) ×2 IMPLANT
TOWEL OR 17X24 6PK STRL BLUE (TOWEL DISPOSABLE) ×4 IMPLANT
WATER STERILE IRR 1000ML POUR (IV SOLUTION) ×2 IMPLANT

## 2011-12-24 NOTE — Anesthesia Preprocedure Evaluation (Signed)
Anesthesia Evaluation  Patient identified by MRN, date of birth, ID band Patient awake    Reviewed: Allergy & Precautions, H&P , Patient's Chart, lab work & pertinent test results, reviewed documented beta blocker date and time   History of Anesthesia Complications Negative for: history of anesthetic complications  Airway Mallampati: IV TM Distance: >3 FB Neck ROM: full    Dental No notable dental hx.    Pulmonary neg pulmonary ROS, sleep apnea ,  clear to auscultation  Pulmonary exam normal       Cardiovascular Exercise Tolerance: Good hypertension, neg cardio ROS regular Normal    Neuro/Psych  Headaches, PSYCHIATRIC DISORDERS Depression  Neuromuscular disease Negative Neurological ROS  Negative Psych ROS   GI/Hepatic negative GI ROS, Neg liver ROS,   Endo/Other  Negative Endocrine ROSMorbid obesity  Renal/GU negative Renal ROS     Musculoskeletal   Abdominal   Peds  Hematology negative hematology ROS (+)   Anesthesia Other Findings   Reproductive/Obstetrics negative OB ROS                           Anesthesia Physical Anesthesia Plan  ASA: III  Anesthesia Plan: General LMA   Post-op Pain Management:    Induction:   Airway Management Planned:   Additional Equipment:   Intra-op Plan:   Post-operative Plan:   Informed Consent: I have reviewed the patients History and Physical, chart, labs and discussed the procedure including the risks, benefits and alternatives for the proposed anesthesia with the patient or authorized representative who has indicated his/her understanding and acceptance.   Dental Advisory Given  Plan Discussed with: CRNA, Surgeon and Anesthesiologist  Anesthesia Plan Comments:         Anesthesia Quick Evaluation

## 2011-12-24 NOTE — Op Note (Signed)
PREOPERATIVE DIAGNOSIS:  Menorrhagia POSTOPERATIVE DIAGNOSIS: The same PROCEDURE:  Hysteroscopy, NovaSure Endometrial Ablation, Rollerball Ablation SURGEON:  Dr. Jaynie Collins  INDICATIONS: 49 y.o. P3 here for surgical management of menorrhagia.  Risks of surgery were discussed with the patient including but not limited to: bleeding which may require transfusion; infection which may require antibiotics; injury to uterus leading to risk of injury to surrounding intraperitoneal organs, need for additional procedures including laparoscopy or laparotomy, and other postoperative/anesthesia complications. Written informed consent was obtained.   FINDINGS:  A 9 week size anteverted/midline/retroverted uterus.  Diffuse proliferative endometrium.  Normal ostia bilaterally. Top of fundus not ablated well during the Novasure procedure, used rollerball to obtain good ablation of this area.    ANESTHESIA:   General, paracervical block. INTRAVENOUS FLUIDS: 1000 ml of LR FLUID DEFICITS:  125 ml of Lactated Ringers ESTIMATED BLOOD LOSS:  Less than 20 ml. SPECIMENS: None COMPLICATIONS:  None immediate.  PROCEDURE DETAILS:  The patient was taken to the operating room where general anesthesia was administered and was found to be adequate.  After an adequate timeout was performed, she was placed in the dorsal lithotomy position and examined; then prepped and draped in the sterile manner.   Her bladder was catheterized for an unmeasured amount of clear, yellow urine. A speculum was then placed in the patient's vagina and a single tooth tenaculum was applied to the anterior lip of the cervix.  A paracervical block using 0.5% Marcaine was administered.  The uterine cavity was sounded to 9 cm, and the sound was used to obtain the cervical and uterine cavity length measurements.  The cervix was dilated manually with Hegar dilators to accommodate the 5 mm hysteroscope.  Once the cervix was dilated, the hysteroscope was  inserted under direct visualization.   The hysteroscope was also used to determine the level of the internal os, and measurements were confirmed. The uterine cavity was carefully examined, both ostia were recognized, and diffusely proliferative endometrium with polypoid fragments was noted. The hysteroscope was removed and the cervix was further dilated to accommodate the NovaSure device.  The NovaSure device was inserted, and a cavity width of 3 cm was determined. Using a power of 83 watts, for 80 sec, the endometrial ablation was performed. The hysteroscope was then re-introduced into the uterine cavity, and it was noted that the a small area at the top of fundus was not adequately ablated. The operative hysteroscope was used to perform a rollerball ablation on this small area; complete ablation of the endometrium was then noted. The tenaculum was removed from the anterior lip of the cervix, and the vaginal speculum was removed after noting good hemostasis.  The patient tolerated the procedure well and was taken to the recovery area awake, extubated and in stable condition.  The patient will be discharged to home as per PACU criteria.  Routine postoperative instructions given.  She was prescribed Percocet, Ibuprofen and Colace.  She will follow up in the clinic on 01/14/12 at 10:30am for postoperative evaluation .

## 2011-12-24 NOTE — H&P (Signed)
Preoperative History and Physical  Jessica [name] is a 50 y.o. P3 here for surgical management of menorrhagia.   Proposed surgery: Hysteroscopy, D&C and Novasure endometrial ablation  PMH:    Past Medical History  Diagnosis Date  . Hypertension   . Obesity   . HSV (herpes simplex virus) infection   . Headache   . Depression      on meds  . BV (bacterial vaginosis)     recent dx - finish med 12/22/11  . Insomnia     on meds  . Sleep apnea     occas. uses CPAP  . Anal fissure     history  . Arthritis     feet    PSH:     Past Surgical History  Procedure Date  . Neck surgery 2001    cervical fusion C4/5  . Cesarean section     x 3  . Tubal ligation 1992  . Cholecystectomy     Medications: Current facility-administered medications:lactated ringers infusion, , Intravenous, Continuous, Tereso Newcomer, MD, Last Rate: 125 mL/hr at 12/24/11 1224  Allergies:  Allergies  Allergen Reactions  . Ace Inhibitors     Lip swelling with lisinopril  . Angiotensin Receptor Blockers     Lip swelling with ACE  . Lisinopril     Lips swell   . Latex Itching and Rash   SH:   History  Substance Use Topics  . Smoking status: Never Smoker   . Smokeless tobacco: Never Used  . Alcohol Use: Yes     socially   FH:    Family History  Problem Relation Age of Onset  . Diabetes Mother   . Diabetes Father   . Diabetes Sister   . Cancer Paternal Aunt     cevical  . Cancer Paternal Aunt     cervical    Review of Systems: Not indicated; not relevant to the procedure  PHYSICAL EXAM: Blood pressure 152/92, pulse 78, temperature 98.1 F (36.7 C), temperature source Oral, resp. rate 18, SpO2 98.00%. General appearance - alert, well appearing, and in no distress Chest - clear to auscultation, no wheezes, rales or rhonchi, symmetric air entry Heart - normal rate and regular rhythm Abdomen - soft, nontender, nondistended, no masses or organomegaly Pelvic - examination not  indicated Extremities - peripheral pulses normal, no pedal edema, no clubbing or cyanosis  Labs: Recent Results (from the past 336 hour(s))  LIPID PANEL   Collection Time   12/15/11 10:01 AM      Component Value Range   Cholesterol 166  0 - 200 (mg/dL)   Triglycerides 69  <409 (mg/dL)   HDL 55  >81 (mg/dL)   Total CHOL/HDL Ratio 3.0     VLDL 14  0 - 40 (mg/dL)   LDL Cholesterol 97  0 - 99 (mg/dL)  COMPLETE METABOLIC PANEL WITH GFR   Collection Time   12/15/11 10:01 AM      Component Value Range   Sodium 138  135 - 145 (mEq/L)   Potassium 3.7  3.5 - 5.3 (mEq/L)   Chloride 105  96 - 112 (mEq/L)   CO2 20  19 - 32 (mEq/L)   Glucose, Bld 102 (*) 70 - 99 (mg/dL)   BUN 12  6 - 23 (mg/dL)   Creat 1.91  4.78 - 2.95 (mg/dL)   Total Bilirubin 0.2 (*) 0.3 - 1.2 (mg/dL)   Alkaline Phosphatase 81  39 - 117 (U/L)   AST 16  0 - 37 (U/L)   ALT 12  0 - 35 (U/L)   Total Protein 7.7  6.0 - 8.3 (g/dL)   Albumin 3.8  3.5 - 5.2 (g/dL)   Calcium 9.3  8.4 - 16.1 (mg/dL)   GFR, Est African American >89     GFR, Est Non African American >89    POCT WET PREP (WET MOUNT)   Collection Time   12/15/11 10:02 AM      Component Value Range   Source Wet Prep POC VAG     WBC, Wet Prep HPF POC RARE     Bacteria Wet Prep HPF POC 3+ COCCI & RODS     Clue Cells, Wet Prep MANY     Yeast, Wet Prep NEG     Trichomonas Wet Prep HPF POC NEG    CBC   Collection Time   12/17/11 10:29 PM      Component Value Range   WBC 9.1  4.0 - 10.5 (K/uL)   RBC 4.29  3.87 - 5.11 (MIL/uL)   Hemoglobin 13.2  12.0 - 15.0 (g/dL)   HCT 09.6  04.5 - 40.9 (%)   MCV 90.2  78.0 - 100.0 (fL)   MCH 30.8  26.0 - 34.0 (pg)   MCHC 34.1  30.0 - 36.0 (g/dL)   RDW 81.1  91.4 - 78.2 (%)   Platelets 315  150 - 400 (K/uL)  COMPREHENSIVE METABOLIC PANEL   Collection Time   12/17/11 10:29 PM      Component Value Range   Sodium 138  135 - 145 (mEq/L)   Potassium 3.1 (*) 3.5 - 5.1 (mEq/L)   Chloride 99  96 - 112 (mEq/L)   CO2 24  19 -  32 (mEq/L)   Glucose, Bld 91  70 - 99 (mg/dL)   BUN 9  6 - 23 (mg/dL)   Creatinine, Ser 9.56  0.50 - 1.10 (mg/dL)   Calcium 8.9  8.4 - 21.3 (mg/dL)   Total Protein 7.7  6.0 - 8.3 (g/dL)   Albumin 3.4 (*) 3.5 - 5.2 (g/dL)   AST 15  0 - 37 (U/L)   ALT 13  0 - 35 (U/L)   Alkaline Phosphatase 88  39 - 117 (U/L)   Total Bilirubin 0.1 (*) 0.3 - 1.2 (mg/dL)   GFR calc non Af Amer 86 (*) >90 (mL/min)   GFR calc Af Amer >90  >90 (mL/min)  CARDIAC PANEL(CRET KIN+CKTOT+MB+TROPI)   Collection Time   12/17/11 10:29 PM      Component Value Range   Total CK 113  7 - 177 (U/L)   CK, MB 1.5  0.3 - 4.0 (ng/mL)   Troponin I <0.30  <0.30 (ng/mL)   Relative Index 1.3  0.0 - 2.5   CARDIAC PANEL(CRET KIN+CKTOT+MB+TROPI)   Collection Time   12/18/11  1:48 AM      Component Value Range   Total CK 99  7 - 177 (U/L)   CK, MB 1.4  0.3 - 4.0 (ng/mL)   Troponin I <0.30  <0.30 (ng/mL)   Relative Index RELATIVE INDEX IS INVALID  0.0 - 2.5   CBC   Collection Time   12/19/11 10:07 AM      Component Value Range   WBC 12.9 (*) 4.0 - 10.5 (K/uL)   RBC 4.21  3.87 - 5.11 (MIL/uL)   Hemoglobin 13.2  12.0 - 15.0 (g/dL)   HCT 08.6  57.8 - 46.9 (%)   MCV 92.9  78.0 -  100.0 (fL)   MCH 31.4  26.0 - 34.0 (pg)   MCHC 33.8  30.0 - 36.0 (g/dL)   RDW 81.1  91.4 - 78.2 (%)   Platelets 358  150 - 400 (K/uL)  BASIC METABOLIC PANEL   Collection Time   12/19/11 10:07 AM      Component Value Range   Sodium 140  135 - 145 (mEq/L)   Potassium 3.2 (*) 3.5 - 5.1 (mEq/L)   Chloride 103  96 - 112 (mEq/L)   CO2 27  19 - 32 (mEq/L)   Glucose, Bld 97  70 - 99 (mg/dL)   BUN 9  6 - 23 (mg/dL)   Creatinine, Ser 9.56  0.50 - 1.10 (mg/dL)   Calcium 9.2  8.4 - 21.3 (mg/dL)   GFR calc non Af Amer >90  >90 (mL/min)   GFR calc Af Amer >90  >90 (mL/min)  PREGNANCY, URINE   Collection Time   12/24/11 11:56 AM      Component Value Range   Preg Test, Ur NEGATIVE      Labs and Imaging  09/10/11 TRANSABDOMINAL AND TRANSVAGINAL  ULTRASOUND OF PELVIS  Findings: Uterus: Demonstrates a sagittal length of 9.4 cm, AP depth of 4.8 cm and a transverse width of 4.5 cm. A focal fibroid is identified in the anterior lower uterine segment measuring 2.6 x 2.1 x 2.4 cm. This is mural in location. Mild diffuse heterogeneity of the myometrium is otherwise seen with no other focal fibroids identified. No areas of focal thickening or heterogeneity are identified. No focal mural thickening, mural cysts or loss of the myometrial endometrial interface is identified to otherwise suggest adenomyosis.  Endometrium: Is thin and echogenic with an AP width of 5.5 mm.  Right ovary: Measures 2.1 by 2.4 by 2.1 cm and appears poorly resolved endovaginally due to scanning parameters. No definite focal parenchymal abnormality is identified.  Left ovary: Is well seen only transabdominally and has a normal appearance measuring 2.7 by 1.1 x 2.3 cm  Other findings: No pelvic fluid or separate adnexal masses are seen.  IMPRESSION: Focal fibroid with size and location as noted above. Otherwise mild diffuse myometrial heterogeneity which can be seen with adenomyosis although no other distinguishing features of this process are evident. Normal endometrial lining and left ovary. No obvious right ovarian abnormality with poor resolution possible.   10/27/11 Endometrial biopsy WEAKLY PROLIFERATIVE ENDOMETRIUM, BENIGN ENDOCERVIX WITH ABUNDANT BLOOD AND MUCUS. NO HYPERPLASIA OR CARCINOMA.   10/27/11 Pap Smear ASCUS, +HPV   11/11/11 Colposcopy showed CIN1, negative ECC  11/05/2011 DG SCREEN MAMMOGRAM BILATERAL Bilateral CC and MLO view(s) were taken. DIGITAL SCREENING MAMMOGRAM WITH CAD: There are scattered fibroglandular densities. No masses or malignant type calcifications are identified. Compared with prior studies. Images were processed with CAD. IMPRESSION: No specific mammographic evidence of malignancy. Next screening mammogram is recommended in one year. A result  letter of this screening mammogram will be mailed directly to the patient. ASSESSMENT: Negative - BI-RADS 1 Screening mammogram in 1 year. ,   12/17/2011  *RADIOLOGY REPORT*  Clinical Data: Chest pain, shortness of breath.  CHEST - 2 VIEW  Comparison: 03/06/2010 CT  Findings: Hypoaeration results in interstitial crowding.  Minimal lung base atelectasis.  Otherwise, no focal consolidation, pleural effusion, or pneumothorax.  No acute osseous abnormality.  Surgical clips right upper quadrant.  IMPRESSION: Hypoaeration results in interstitial crowding.  Mild bibasilar atelectasis.  Otherwise, no acute process.  Original Report Authenticated By: Waneta Martins, M.D.  12/18/2011  *RADIOLOGY REPORT*  Clinical Data:  Chest pain, shortness of breath, evaluate for PE  CT ANGIOGRAPHY CHEST WITH CONTRAST  Technique:  Multidetector CT imaging of the chest was performed using the standard protocol during bolus administration of intravenous contrast.  Multiplanar CT image reconstructions including MIPs were obtained to evaluate the vascular anatomy.  Contrast: 80mL OMNIPAQUE IOHEXOL 350 MG/ML IV SOLN  Comparison:  Chest radiograph dated 12/17/2011  Findings:  No evidence of pulmonary embolism.  The lungs are clear.  No suspicious pulmonary nodules.  No pleural effusion or pneumothorax. The visualized thyroid is unremarkable.  The heart is top normal in size.  No pericardial effusion.  No suspicious mediastinal, hilar, or axillary lymphadenopathy.  Visualized upper abdomen is unremarkable.  Degenerative changes of the visualized thoracolumbar spine.  Review of the MIP images confirms the above findings.  IMPRESSION: No evidence of pulmonary embolism.  No evidence of acute cardiopulmonary disease.  Original Report Authenticated By: Charline Bills, M.D.   Assessment: Patient Active Problem List  Diagnoses  . Morbid obesity  . DEPRESSIVE DISORDER, RCR, MODERATE  . POOR CONCENTRATION  . SLEEP APNEA, OBSTRUCTIVE  .  MIGRAINE NOS W/O INTRACTABLE MIGRAINE  . Essential hypertension, benign  . VENOUS INSUFFICIENCY, LEGS  . VAGINITIS NOS  . VAGINAL DISCHARGE  . MENSTRUAL DISORDER  . UNSPECIFIED ALOPECIA  . ARTHRITIS, ACROMIOCLAVICULAR  . KNEE PAIN, RIGHT  . BACK PAIN, LUMBAR  . BACK PAIN  . Unspecified Disorders of Bursae and Tendons in Shoulder Region  . BICEPS TENDINITIS  . LATERAL EPICONDYLITIS, LEFT  . 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 INSOMNIA  . LEG EDEMA  . CHEST PAIN, INTERMITTENT  . ROTATOR CUFF SPRAIN AND STRAIN  . IUD MALFUNCTION  . Angioedema of lips  . CONGENITAL PES PLANUS  . Allergic rhinitis  . Glucose intolerance (pre-diabetes)  . Contact with or exposure to viral disease  . Chronic female pelvic pain  . Menorrhagia    Plan: Will proceed with Hysteroscopy, D&C and Novasure endometrial ablation. Risks of surgery were discussed with the patient including but not limited to: bleeding which may require transfusion; infection which may require antibiotics; injury to uterus leading to risk of injury to surrounding intraperitoneal organs, need for additional procedures including laparoscopy or laparotomy, and other postoperative/anesthesia complications. The patient also understands the alternative treatment options and likelihood of success of procedure which were discussed in full. All questions were answered.  To OR when ready.   Jaynie Collins, M.D. 12/24/2011 12:51 PM

## 2011-12-24 NOTE — Transfer of Care (Signed)
Immediate Anesthesia Transfer of Care Note  Patient: Jessica [name]  Procedure(s) Performed:  DILATATION & CURETTAGE/HYSTEROSCOPY WITH NOVASURE ABLATION - Roller ballEndometrial  Ablation  Patient Location: PACU  Anesthesia Type: General  Level of Consciousness: awake and oriented  Airway & Oxygen Therapy: Patient Spontanous Breathing and Patient connected to nasal cannula oxygen  Post-op Assessment: Report given to PACU RN and Post -op Vital signs reviewed and stable  Post vital signs: Reviewed and stable  Complications: No apparent anesthesia complications

## 2011-12-24 NOTE — Anesthesia Procedure Notes (Signed)
Procedure Name: LMA Insertion Date/Time: 12/24/2011 1:53 PM Performed by: Isabella Bowens Pre-anesthesia Checklist: Patient identified, Emergency Drugs available, Suction available, Patient being monitored and Timeout performed Patient Re-evaluated:Patient Re-evaluated prior to inductionOxygen Delivery Method: Circle System Utilized Preoxygenation: Pre-oxygenation with 100% oxygen Intubation Type: IV induction Grade View: Grade IV Number of attempts: 1 Placement Confirmation: positive ETCO2 and breath sounds checked- equal and bilateral Dental Injury: Teeth and Oropharynx as per pre-operative assessment

## 2011-12-24 NOTE — Anesthesia Postprocedure Evaluation (Signed)
Anesthesia Post Note  Patient: Jessica [name]  Procedure(s) Performed:  DILATATION & CURETTAGE/HYSTEROSCOPY WITH NOVASURE ABLATION - Roller ballEndometrial  Ablation  Anesthesia type: General  Patient location: PACU  Post pain: Pain level controlled  Post assessment: Post-op Vital signs reviewed  Last Vitals:  Filed Vitals:   12/24/11 1500  BP: 130/72  Pulse: 65  Temp:   Resp: 12    Post vital signs: Reviewed  Level of consciousness: sedated  Complications: No apparent anesthesia complicationsfj

## 2012-01-12 ENCOUNTER — Telehealth: Payer: Self-pay | Admitting: *Deleted

## 2012-01-12 DIAGNOSIS — B373 Candidiasis of vulva and vagina: Secondary | ICD-10-CM

## 2012-01-12 MED ORDER — FLUCONAZOLE 150 MG PO TABS: 150.0000 mg | ORAL_TABLET | Freq: Once | ORAL | Status: AC

## 2012-01-12 NOTE — Telephone Encounter (Signed)
Patient is having clumpy itchy discharge. Would like Diflucan.

## 2012-01-14 ENCOUNTER — Encounter: Payer: Self-pay | Admitting: Obstetrics & Gynecology

## 2012-01-14 ENCOUNTER — Ambulatory Visit (INDEPENDENT_AMBULATORY_CARE_PROVIDER_SITE_OTHER): Payer: Medicaid Other | Admitting: Obstetrics & Gynecology

## 2012-01-14 VITALS — BP 110/80 | Ht 61.0 in | Wt 288.0 lb

## 2012-01-14 DIAGNOSIS — Z09 Encounter for follow-up examination after completed treatment for conditions other than malignant neoplasm: Secondary | ICD-10-CM

## 2012-01-14 NOTE — Progress Notes (Signed)
History:  51 y.o. P3 here for postop visit after ablation on 12/24/11.  Still reports having bleeding and discharge, not heavy.  Had a yeast infection and been treated for it.  Taking Provera now to help stop the bleeding.  No other concerns.  The following portions of the patient's history were reviewed and updated as appropriate: allergies, current medications, past family history, past medical history, past social history, past surgical history and problem list.   Objective:  Physical Exam Blood pressure 110/80, height 5\' 1"  (1.549 m), weight 288 lb (130.636 kg), last menstrual period 12/24/2011. Deferred  Surgical pathology: Benign endometrium   Assessment & Plan:  Patient was reassured that she can have irregular bleeding/discharge for first few weeks after ablation.  If bleeding worsens or continues after first 2-3 months post ablation, may consider other management options.  Not a great candidate for hysterectomy given her morbid obesity and other co-morbidities. Patient to return in 4 weeks for reevaluation.

## 2012-02-03 ENCOUNTER — Encounter (HOSPITAL_BASED_OUTPATIENT_CLINIC_OR_DEPARTMENT_OTHER): Payer: Self-pay | Admitting: Emergency Medicine

## 2012-02-03 ENCOUNTER — Emergency Department (HOSPITAL_BASED_OUTPATIENT_CLINIC_OR_DEPARTMENT_OTHER)
Admission: EM | Admit: 2012-02-03 | Discharge: 2012-02-03 | Disposition: A | Discharge status: Home / Self Care | Payer: Medicaid Other | Attending: Emergency Medicine | Admitting: Emergency Medicine

## 2012-02-03 ENCOUNTER — Emergency Department (INDEPENDENT_AMBULATORY_CARE_PROVIDER_SITE_OTHER): Payer: Medicaid Other

## 2012-02-03 DIAGNOSIS — R269 Unspecified abnormalities of gait and mobility: Secondary | ICD-10-CM | POA: Insufficient documentation

## 2012-02-03 DIAGNOSIS — E669 Obesity, unspecified: Secondary | ICD-10-CM | POA: Insufficient documentation

## 2012-02-03 DIAGNOSIS — M25569 Pain in unspecified knee: Secondary | ICD-10-CM | POA: Insufficient documentation

## 2012-02-03 DIAGNOSIS — G4730 Sleep apnea, unspecified: Secondary | ICD-10-CM | POA: Insufficient documentation

## 2012-02-03 DIAGNOSIS — I10 Essential (primary) hypertension: Secondary | ICD-10-CM | POA: Insufficient documentation

## 2012-02-03 MED ORDER — HYDROCODONE-ACETAMINOPHEN 5-325 MG PO TABS: 2.0000 | ORAL_TABLET | ORAL | Status: AC | PRN

## 2012-02-03 NOTE — ED Provider Notes (Signed)
History     CSN: [id number]  Arrival date & time 02/03/12  2042   First MD Initiated Contact with Patient 02/03/12 2103      Chief Complaint  Patient presents with  . Leg Pain    (Consider location/radiation/quality/duration/timing/severity/associated sxs/prior treatment) Patient is a 51 y.o. female presenting with leg pain. The history is provided by the patient. No language interpreter was used.  Leg Pain  Incident onset: a year. The incident occurred at home. There was no injury mechanism. The pain is present in the right leg. The quality of the pain is described as aching. The pain is at a severity of 7/10. The pain is severe. The pain has been worsening since onset. Associated symptoms include inability to bear weight. Pertinent negatives include no numbness. The symptoms are aggravated by bearing weight. She has tried NSAIDs for the symptoms. The treatment provided mild relief.   Pt complains of pain in the back of right leg behind her knee.  Pt reports she has been having pain for the past year.  Pt reports negative dvt study 2 years ago for similar episode Past Medical History  Diagnosis Date  . Hypertension   . Obesity   . HSV (herpes simplex virus) infection   . Headache   . Depression      on meds  . BV (bacterial vaginosis)     recent dx - finish med 12/22/11  . Insomnia     on meds  . Sleep apnea     occas. uses CPAP  . Anal fissure     history  . Arthritis     feet    Past Surgical History  Procedure Date  . Neck surgery 2001    cervical fusion C4/5  . Cesarean section     x 3  . Tubal ligation 1992  . Cholecystectomy     Family History  Problem Relation Age of Onset  . Diabetes Mother   . Diabetes Father   . Diabetes Sister   . Cancer Paternal Aunt     cevical  . Cancer Paternal Aunt     cervical    History  Substance Use Topics  . Smoking status: Never Smoker   . Smokeless tobacco: Never Used  . Alcohol Use: Yes     socially    OB  History    Grav Para Term Preterm Abortions TAB SAB Ect Mult Living                  Review of Systems  Musculoskeletal: Positive for joint swelling and gait problem.  Neurological: Negative for numbness.  All other systems reviewed and are negative.    Allergies  Ace inhibitors; Angiotensin receptor blockers; Lisinopril; and Latex  Home Medications   Current Outpatient Rx  Name Route Sig Dispense Refill  . ACETAMINOPHEN 500 MG PO TABS Oral Take 2 tablets (1,000 mg total) by mouth every 6 (six) hours as needed. For pain 30 tablet 2  . BUPROPION HCL ER (XL) 300 MG PO TB24 Oral Take 1 tablet (300 mg total) by mouth every morning. 90 tablet 3  . [employer] STOOL SOFTENER 100 MG PO CAPS  TAKE 1 CAPSULE BY MOUTH 2 TIMES A DAY 60 capsule 1  . CYCLOBENZAPRINE HCL 10 MG PO TABS Oral Take 1 tablet (10 mg total) by mouth 2 (two) times daily as needed for muscle spasms. For back spasms  30 tablet 6  . DOCUSATE SODIUM 100 MG PO CAPS Oral  Take 100 mg by mouth 2 (two) times daily.    Marland Kitchen FLUTICASONE PROPIONATE 50 MCG/ACT NA SUSP Nasal 2 sprays by Nasal route daily. 16 g 2  . HYDROCHLOROTHIAZIDE 25 MG PO TABS Oral Take 1 tablet (25 mg total) by mouth daily. 30 tablet 3  . IBUPROFEN 800 MG PO TABS Oral Take 1 tablet (800 mg total) by mouth every 8 (eight) hours as needed. For pain 30 tablet 2  . TOPIRAMATE 25 MG PO TABS  TAKE 1 TABLET TWICE DAILY 60 tablet 2  . VALACYCLOVIR HCL 500 MG PO TABS  TAKE 1 TABLET BY MOUTH EVERY DAY 90 tablet 3  . ZOLPIDEM TARTRATE 5 MG PO TABS Oral Take 5 mg by mouth at bedtime as needed. For sleep       BP 128/92  Pulse 94  Temp(Src) 97.9 F (36.6 C) (Oral)  Resp 18  SpO2 100%  LMP 12/24/2011  Physical Exam  Constitutional: She is oriented to person, place, and time. She appears well-developed and well-nourished.  HENT:  Head: Normocephalic.  Musculoskeletal: She exhibits tenderness.       Decreased range of motion right leg,  Pain with movement  Negative homan's   No calf tenderness,  Pain is in the popliteal area.  Back of knee  Neurological: She is alert and oriented to person, place, and time.  Skin: Skin is warm.  Psychiatric: She has a normal mood and affect.    ED Course  Procedures (including critical care time)  Labs Reviewed - No data to display No results found.   No diagnosis found.    MDM   Results for orders placed during the hospital encounter of 12/24/11  PREGNANCY, URINE      Component Value Range   Preg Test, Ur NEGATIVE     Dg Knee 1-2 Views Right  02/03/2012  *RADIOLOGY REPORT*  Clinical Data: Right knee pain for 2-3 weeks  RIGHT KNEE - 1-2 VIEW  Comparison:  None.  Findings:  There is no evidence of fracture, dislocation, or joint effusion.  There is no evidence of arthropathy or other focal bone abnormality.  Soft tissues are unremarkable.Increased body habitus.  IMPRESSION: Negative.  Original Report Authenticated By: Elsie Stain, M.D.    Pt referred to Dr. Rayburn Ma for evaluation.  Pt given rx for pain medication.  Immbolization/crutches not an option.      Langston Masker, [place] 02/03/12 2152

## 2012-02-03 NOTE — ED Provider Notes (Signed)
Medical screening examination/treatment/procedure(s) were performed by non-physician practitioner and as supervising physician I was immediately available for consultation/collaboration.   Dayton Bailiff, MD 02/03/12 2235

## 2012-02-03 NOTE — ED Notes (Signed)
Pt reports having negative DVT work up for same symptoms in 2012.

## 2012-02-03 NOTE — ED Notes (Signed)
Pt c/o pain behind right knee intermittent since jan 18.

## 2012-02-12 ENCOUNTER — Ambulatory Visit (INDEPENDENT_AMBULATORY_CARE_PROVIDER_SITE_OTHER): Payer: Medicaid Other | Admitting: Obstetrics & Gynecology

## 2012-02-12 ENCOUNTER — Encounter: Payer: Self-pay | Admitting: Obstetrics & Gynecology

## 2012-02-12 DIAGNOSIS — N949 Unspecified condition associated with female genital organs and menstrual cycle: Secondary | ICD-10-CM

## 2012-02-12 DIAGNOSIS — M549 Dorsalgia, unspecified: Secondary | ICD-10-CM

## 2012-02-12 DIAGNOSIS — R102 Pelvic and perineal pain: Secondary | ICD-10-CM

## 2012-02-12 DIAGNOSIS — B373 Candidiasis of vulva and vagina: Secondary | ICD-10-CM

## 2012-02-12 DIAGNOSIS — N920 Excessive and frequent menstruation with regular cycle: Secondary | ICD-10-CM

## 2012-02-12 DIAGNOSIS — G8929 Other chronic pain: Secondary | ICD-10-CM

## 2012-02-12 MED ORDER — FLUCONAZOLE 150 MG PO TABS: ORAL_TABLET | ORAL | Status: DC

## 2012-02-12 MED ORDER — CYCLOBENZAPRINE HCL 10 MG PO TABS: 10.0000 mg | ORAL_TABLET | Freq: Three times a day (TID) | ORAL | Status: DC | PRN

## 2012-02-12 MED ORDER — MEDROXYPROGESTERONE ACETATE 10 MG PO TABS: 20.0000 mg | ORAL_TABLET | Freq: Every day | ORAL | Status: DC

## 2012-02-12 NOTE — Progress Notes (Signed)
History:  51 y.o. P3 here for follow up menorrhagia. She had bleeding after ablation on 12/24/11; successfully treated with Provera 20 mg po daily.  No concerns. Has another yeast infection, wants more Diflucan.  Also wants refill of her Flexeril.  No other concerns.  Had CIN I on colposcopy in 10/2012, next pap due 04/2012.  The following portions of the patient's history were reviewed and updated as appropriate: allergies, current medications, past family history, past medical history, past social history, past surgical history and problem list.   Objective:  Physical Exam Last menstrual period 01/27/2012. Deferred  Surgical pathology: Benign endometrium   Assessment & Plan:  Continue Provera, other medications also e-prescribed.   Not a great candidate for hysterectomy given her morbid obesity and other co-morbidities. Patient to return in  5 months for repeat pap smear. Bleeding precautions reviewed.

## 2012-02-16 ENCOUNTER — Encounter: Payer: Self-pay | Admitting: Family Medicine

## 2012-02-16 ENCOUNTER — Ambulatory Visit (INDEPENDENT_AMBULATORY_CARE_PROVIDER_SITE_OTHER): Payer: Medicaid Other | Admitting: Family Medicine

## 2012-02-16 VITALS — BP 149/83 | HR 84 | Temp 98.5°F | Ht 61.0 in | Wt 289.5 lb

## 2012-02-16 DIAGNOSIS — M79604 Pain in right leg: Secondary | ICD-10-CM

## 2012-02-16 DIAGNOSIS — I10 Essential (primary) hypertension: Secondary | ICD-10-CM

## 2012-02-16 DIAGNOSIS — M79609 Pain in unspecified limb: Secondary | ICD-10-CM

## 2012-02-16 DIAGNOSIS — Z111 Encounter for screening for respiratory tuberculosis: Secondary | ICD-10-CM

## 2012-02-16 MED ORDER — HYDROCODONE-ACETAMINOPHEN 5-500 MG PO TABS: 1.0000 | ORAL_TABLET | Freq: Four times a day (QID) | ORAL | Status: AC | PRN

## 2012-02-16 NOTE — Assessment & Plan Note (Signed)
Consider baker's cyst.  Pt would like to go to Sports Medicine to see Dr. Karie Schwalbe for repeat toe injection. Will refer for possible Baker's cyst as well.  Follow up after the appt.

## 2012-02-16 NOTE — Progress Notes (Signed)
  Subjective:    Patient ID: Jessica [name], female    DOB: Dec 17, 1961, 51 y.o.   MRN: [id number]  HPI 51 yo female here for R leg pain.  Reports chronic pain in that leg, but acutely worse on 01/16/12.  Seen in ED at that time, given meds which she says the MD forgot to sign, so did not fill.  Pain started in popliteal crease, extends down to back of foot and up to toes.  Pain also circumferential around calf.  Worse when sitting in a hard, plastic chair that hits the back of her knee.  No specific injury to her leg.  Walking worsens the pain. Reports she walked 2 miles last week, and then the next day she couldn't walk on leg.  Pain is burning.  Stumbles in shower because of pain.  No relief with heat/ice/soaking/800 mg ibuprofen/flexeril.      Review of Systems  See HPI     Objective:   Physical Exam  Constitutional: No distress.       Morbidly obese  Musculoskeletal:       R leg with TTP in popliteal area, especially medial aspect.  No discrete cyst palpated, but exam limited by body habitus.   Knees without erythema/warmth/swelling.  FROM.  Stable ant/post/medially/laterally.  Calf without warmth or erythema.  No discrepancy between calf sizes.    Skin: She is not diaphoretic.          Assessment & Plan:

## 2012-02-16 NOTE — Assessment & Plan Note (Signed)
Almost at goal. In pain today, likely affects her BP.  Will check K and Mg when returns for PPD check on Wednesday

## 2012-02-16 NOTE — Patient Instructions (Signed)
You may have a cyst behind your knee. I will see if Sports Medicine clinic can see you and do an ultrasound to help [place] understand why you are having such pain. We need to check your potassium and magnesium.  You can do this when you come back for your TB test reading on Wednesday. You can continue to take the ibuprofen with the Vicodin, but do not take Tylenol or acetaminophen.

## 2012-02-18 ENCOUNTER — Ambulatory Visit (INDEPENDENT_AMBULATORY_CARE_PROVIDER_SITE_OTHER): Payer: Medicaid Other | Admitting: *Deleted

## 2012-02-18 ENCOUNTER — Other Ambulatory Visit: Payer: Medicaid Other

## 2012-02-18 DIAGNOSIS — M79604 Pain in right leg: Secondary | ICD-10-CM

## 2012-02-18 DIAGNOSIS — M719 Bursopathy, unspecified: Secondary | ICD-10-CM

## 2012-02-18 DIAGNOSIS — M67919 Unspecified disorder of synovium and tendon, unspecified shoulder: Secondary | ICD-10-CM

## 2012-02-18 DIAGNOSIS — IMO0001 Reserved for inherently not codable concepts without codable children: Secondary | ICD-10-CM

## 2012-02-18 DIAGNOSIS — M25569 Pain in unspecified knee: Secondary | ICD-10-CM

## 2012-02-18 DIAGNOSIS — Z111 Encounter for screening for respiratory tuberculosis: Secondary | ICD-10-CM

## 2012-02-18 LAB — MAGNESIUM: Magnesium: 1.9 mg/dL (ref 1.5–2.5)

## 2012-02-18 LAB — TB SKIN TEST: Induration: 0

## 2012-02-18 NOTE — Progress Notes (Signed)
PPD negative-0 mm. 

## 2012-02-20 ENCOUNTER — Encounter: Payer: Self-pay | Admitting: Family Medicine

## 2012-02-25 ENCOUNTER — Ambulatory Visit: Payer: Medicaid Other | Admitting: Family Medicine

## 2012-03-10 ENCOUNTER — Ambulatory Visit (INDEPENDENT_AMBULATORY_CARE_PROVIDER_SITE_OTHER): Payer: Medicaid Other | Admitting: Family Medicine

## 2012-03-10 VITALS — BP 148/95

## 2012-03-10 DIAGNOSIS — M79606 Pain in leg, unspecified: Secondary | ICD-10-CM | POA: Insufficient documentation

## 2012-03-10 DIAGNOSIS — M79609 Pain in unspecified limb: Secondary | ICD-10-CM

## 2012-03-10 HISTORY — DX: Pain in leg, unspecified: M79.606

## 2012-03-10 NOTE — Progress Notes (Signed)
  Subjective:    Patient ID: Jessica [name], female    DOB: 1961/04/17, 51 y.o.   MRN: [id number]  HPI 51 y/o female is here c/o right calf pain since January.  She was seen in the ED where she was given pain medication after being told that she did not have a blood clot.  She has been walking for exercise trying to lose weight.  On the day after she walks the pain is worse.  The pain does sometimes radiate into the foot.  She doesn't have any thigh, buttock or back pain.  She does have a history of cervical radiculopathy that was treated with cervical fusion.  She has no history of trauma.  No travel. No CP. No SOB   Review of Systems     Objective:   Physical Exam  Obese body habitus Calf mildly tender to palpation proximally Negative homan Negative straight leg raise Knee has normal motion (limited by body habitus) No tenderness to palpation or obvious effusion of the knee No tenderness to palpation of the thigh or the lower back Gait is normal  Consent obtained and verified. cleansed with alcohol. Topical analgesic spray: Ethyl chloride. Joint: right knee Approached in typical fashion from the anterior medial aspect Completed without difficulty Meds: 80 mg depo medrol; 6 ml of lidocaine without epinephrine Needle: 25 g Aftercare instructions and Red flags advised.        Assessment & Plan:

## 2012-03-10 NOTE — Patient Instructions (Signed)
1. You may have some crampy feeling in your knee tonight.  That is common.  Place ice on it for 10-15 minutes if this happens.  2. Restart your ibuprofen daily.  3. Follow up with me in 3-4 weeks.

## 2012-03-10 NOTE — Assessment & Plan Note (Signed)
Discussed the main diagnoses on the differential with the patient.  We have injected the knee today.  If this is a Baker's cyst she should get some improvement with injection, ibuprofen, and rest.  If there is no improvement we will start looking at her spine as the etiology of her pain.

## 2012-03-14 ENCOUNTER — Other Ambulatory Visit: Payer: Self-pay | Admitting: Family Medicine

## 2012-03-14 NOTE — Telephone Encounter (Signed)
Refill request

## 2012-03-15 ENCOUNTER — Ambulatory Visit (INDEPENDENT_AMBULATORY_CARE_PROVIDER_SITE_OTHER): Payer: Medicaid Other | Admitting: Family Medicine

## 2012-03-15 ENCOUNTER — Encounter: Payer: Self-pay | Admitting: Family Medicine

## 2012-03-15 VITALS — BP 136/84 | HR 87

## 2012-03-15 DIAGNOSIS — M25569 Pain in unspecified knee: Secondary | ICD-10-CM

## 2012-03-16 NOTE — Progress Notes (Signed)
  Subjective:    Patient ID: Jessica [name], female    DOB: 12-22-1961, 51 y.o.   MRN: [id number]  HPI  Several weeks (since January) of right sided posterior knee/calf pain. Started acutely. She cannot recall a specific injury but she does recall the date it started hurting. She had been doing a lot of extra walking in the days prior to this and she had been doing some exercise bike but recalls no injury with either activity. Was seen in the emergency department and dilated for blood clot which was negative. Was seen here in [facility] medicine Center 2 weeks ago and given an injection into the knee area that helped for about a day.  Pain is diffuse right behind the knee in the crease of the knee radiates to the calf. There is an area of point tenderness on her calf.   Review of Systems Denies any N. usual swelling of lower extremity joints including the knee. She seen no erythema of the knee joint calf or foot. Denies numbness in the foot on the right.    Objective:   Physical Exam  GENERAL: Obese female no acute distress KNEES: Full range of motion in extension and flexion. CALF RIGHT: There to palpation and on the medial proximal gastrocnemius muscle and this reduces her pain. Active dorsiflexion also reproduces her pain.   ULTRASOUND: Difficult secondary to habitus. I see no Baker cyst in the popliteal space. I was able to identify the popliteal artery in both large pseudoaneurysm or sacs is in there appears to be no abnormal flow, no aneurysm. The meniscus bilaterally are very poorly seen. The calf muscle is intact except for very small area of the medial gastroc proximally. This corresponds with her area of tenderness. In this area there is some disruption of muscle fibers, there is no increase in Doppler flow however.    Assessment & Plan:  #1. Right lower extremity pain. I suspect this is multifactorial. Looking back in her history I think she probably has had a meniscal injury at some  point which the steroid shot may have helped. She likely also has had a small gastrocnemius strain. We will put her in a quarter inch right heel lift. Will start her on some Strengthening exercises. Icing. See her back in 2-3 weeks. We gave her the calf exercise handout and demonstrated.

## 2012-03-22 ENCOUNTER — Ambulatory Visit (INDEPENDENT_AMBULATORY_CARE_PROVIDER_SITE_OTHER): Payer: Medicaid Other | Admitting: Family Medicine

## 2012-03-22 ENCOUNTER — Encounter: Payer: Self-pay | Admitting: Family Medicine

## 2012-03-22 VITALS — BP 141/92 | HR 100 | Ht 61.0 in

## 2012-03-22 DIAGNOSIS — I10 Essential (primary) hypertension: Secondary | ICD-10-CM

## 2012-03-22 DIAGNOSIS — M79609 Pain in unspecified limb: Secondary | ICD-10-CM

## 2012-03-22 DIAGNOSIS — M79604 Pain in right leg: Secondary | ICD-10-CM

## 2012-03-22 MED ORDER — HYDROCODONE-ACETAMINOPHEN 5-500 MG PO TABS: 1.0000 | ORAL_TABLET | Freq: Four times a day (QID) | ORAL | Status: AC | PRN

## 2012-03-22 NOTE — Assessment & Plan Note (Signed)
Elevated today, but also in pain.  Will recheck after pain better controlled.

## 2012-03-22 NOTE — Patient Instructions (Signed)
We will do the stronger pain medicine.  You can take it when you need to.

## 2012-03-22 NOTE — Assessment & Plan Note (Signed)
Still with pain, though compliant with exercise regimen.  Will increase pain meds to Vicodin.  Pt aware to only take if necessary.  Will continue with ibuprofen as well.

## 2012-03-22 NOTE — Assessment & Plan Note (Signed)
Pt interested in lap band surgery.  Will send her info about programs at [facility] or [place].  She also knows that [facility] is an option.

## 2012-03-22 NOTE — Progress Notes (Signed)
  Subjective:    Patient ID: Jessica [name], female    DOB: 1961-10-24, 51 y.o.   MRN: [id number]  HPI 51 yo here for follow up of leg pain.  She has been seen in [facility] clinic and given steroid injection and dx with a gastrocnemius strain/possible tear.  She reports she is doing th exercises at home with no relief.  She has had no relief from ibuprofen or tramadol.  She reports severe pain that interferes with her functioning.    She also is interested in learning more about lap-band surgery, and has heard that [facility] and [facility] both have good programs.  She would like to find out more about those programs.      Review of Systems  See HPI     Objective:   Physical Exam  Nursing note and vitals reviewed. Constitutional: She appears well-developed and well-nourished. No distress.  Musculoskeletal:       R leg with TTP popliteal area, calf.  Similar to previous exams.  No erythema, swelling or warmth.  Exam somewhat limited by body habitus.    Skin: She is not diaphoretic.  Psychiatric: She has a normal mood and affect. Her behavior is normal. Judgment and thought content normal.          Assessment & Plan:

## 2012-04-17 ENCOUNTER — Emergency Department (HOSPITAL_COMMUNITY): Payer: Medicaid Other

## 2012-04-17 ENCOUNTER — Other Ambulatory Visit: Payer: Self-pay | Admitting: Family Medicine

## 2012-04-17 ENCOUNTER — Emergency Department (HOSPITAL_COMMUNITY)
Admission: EM | Admit: 2012-04-17 | Discharge: 2012-04-17 | Disposition: A | Discharge status: Home / Self Care | Payer: Medicaid Other | Attending: Emergency Medicine | Admitting: Emergency Medicine

## 2012-04-17 ENCOUNTER — Encounter (HOSPITAL_COMMUNITY): Payer: Self-pay | Admitting: *Deleted

## 2012-04-17 DIAGNOSIS — W010XXA Fall on same level from slipping, tripping and stumbling without subsequent striking against object, initial encounter: Secondary | ICD-10-CM | POA: Insufficient documentation

## 2012-04-17 DIAGNOSIS — IMO0002 Reserved for concepts with insufficient information to code with codable children: Secondary | ICD-10-CM | POA: Insufficient documentation

## 2012-04-17 DIAGNOSIS — I10 Essential (primary) hypertension: Secondary | ICD-10-CM | POA: Insufficient documentation

## 2012-04-17 DIAGNOSIS — S8390XA Sprain of unspecified site of unspecified knee, initial encounter: Secondary | ICD-10-CM

## 2012-04-17 DIAGNOSIS — Z79899 Other long term (current) drug therapy: Secondary | ICD-10-CM | POA: Insufficient documentation

## 2012-04-17 DIAGNOSIS — Z8739 Personal history of other diseases of the musculoskeletal system and connective tissue: Secondary | ICD-10-CM | POA: Insufficient documentation

## 2012-04-17 DIAGNOSIS — S63509A Unspecified sprain of unspecified wrist, initial encounter: Secondary | ICD-10-CM | POA: Insufficient documentation

## 2012-04-17 MED ORDER — OXYCODONE-ACETAMINOPHEN 5-325 MG PO TABS: 1.0000 | ORAL_TABLET | Freq: Four times a day (QID) | ORAL | Status: AC | PRN

## 2012-04-17 MED ORDER — ONDANSETRON 4 MG PO TBDP
4.0000 mg | ORAL_TABLET | Freq: Once | ORAL | Status: AC
  Administered 2012-04-17: 4 mg via ORAL
  Filled 2012-04-17: qty 1

## 2012-04-17 MED ORDER — HYDROMORPHONE HCL PF 2 MG/ML IJ SOLN
2.0000 mg | Freq: Once | INTRAMUSCULAR | Status: AC
  Administered 2012-04-17: 2 mg via INTRAMUSCULAR
  Filled 2012-04-17: qty 1

## 2012-04-17 NOTE — Discharge Instructions (Signed)
Joint Sprain A sprain is a tear or stretch in the ligaments that hold a joint together. Severe sprains may need as long as 3-6 weeks of immobilization and/or exercises to heal completely. Sprained joints should be rested and protected. If not, they can become unstable and prone to re-injury. Proper treatment can reduce your pain, shorten the period of disability, and reduce the risk of repeated injuries. TREATMENT   Rest and elevate the injured joint to reduce pain and swelling.   Apply ice packs to the injury for 20-30 minutes every 2-3 hours for the next 2-3 days.   Keep the injury wrapped in a compression bandage or splint as long as the joint is painful or as instructed by your caregiver.   Do not use the injured joint until it is completely healed to prevent re-injury and chronic instability. Follow the instructions of your caregiver.   Long-term sprain management may require exercises and/or treatment by a physical therapist. Taping or special braces may help stabilize the joint until it is completely better.  SEEK MEDICAL CARE IF:   You develop increased pain or swelling of the joint.   You develop increasing redness and warmth of the joint.   You develop a fever.   It becomes stiff.   Your hand or foot gets cold or numb.  Document Released: 01/22/2005 Document Revised: 12/04/2011 Document Reviewed: 01/01/2009 Exit[employer] Patient Information 2012 [employer]. 

## 2012-04-17 NOTE — ED Provider Notes (Signed)
History     CSN: [id number]  Arrival date & time 04/17/12  1635   First MD Initiated Contact with Patient 04/17/12 1859      Chief Complaint  Patient presents with  . Fall  . Knee Pain    (Consider location/radiation/quality/duration/timing/severity/associated sxs/prior treatment) Patient is a 51 y.o. female presenting with fall and knee pain. The history is provided by the patient.  Fall The accident occurred yesterday. The fall occurred while running (Slipped and fell on the wet concrete 4 when she was getting and out of the rain). She fell from a height of 1 to 2 ft. She landed on concrete. There was no blood loss. The point of impact was the right knee. The pain is present in the right knee (Left wrist). The pain is at a severity of 9/10. The pain is severe. She was ambulatory at the scene. Pertinent negatives include no abdominal pain, no headaches and no loss of consciousness. The symptoms are aggravated by activity and use of the injured limb. She has tried NSAIDs and acetaminophen for the symptoms. The treatment provided no relief.  Knee Pain Pertinent negatives include no abdominal pain and no headaches.    Past Medical History  Diagnosis Date  . Hypertension   . Obesity   . HSV (herpes simplex virus) infection   . Headache   . Depression      on meds  . BV (bacterial vaginosis)     recent dx - finish med 12/22/11  . Insomnia     on meds  . Sleep apnea     occas. uses CPAP  . Anal fissure     history  . Arthritis     feet    Past Surgical History  Procedure Date  . Neck surgery 2001    cervical fusion C4/5  . Cesarean section     x 3  . Tubal ligation 1992  . Cholecystectomy     Family History  Problem Relation Age of Onset  . Diabetes Mother   . Diabetes Father   . Diabetes Sister   . Cancer Paternal Aunt     cevical  . Cancer Paternal Aunt     cervical    History  Substance Use Topics  . Smoking status: Never Smoker   . Smokeless tobacco:  Never Used  . Alcohol Use: Yes     socially    OB History    Grav Para Term Preterm Abortions TAB SAB Ect Mult Living                  Review of Systems  Gastrointestinal: Negative for abdominal pain.  Neurological: Negative for loss of consciousness and headaches.  All other systems reviewed and are negative.    Allergies  Ace inhibitors; Angiotensin receptor blockers; Lisinopril; and Latex  Home Medications   Current Outpatient Rx  Name Route Sig Dispense Refill  . ACETAMINOPHEN 500 MG PO TABS Oral Take 2 tablets (1,000 mg total) by mouth every 6 (six) hours as needed. For pain 30 tablet 2  . BUPROPION HCL ER (XL) 300 MG PO TB24 Oral Take 1 tablet (300 mg total) by mouth every morning. 90 tablet 3  . CVS STOOL SOFTENER 100 MG PO CAPS  TAKE 1 CAPSULE BY MOUTH 2 TIMES A DAY 60 capsule 1  . CYCLOBENZAPRINE HCL 10 MG PO TABS Oral Take 1 tablet (10 mg total) by mouth 3 (three) times daily as needed for muscle spasms. For back  spasms 30 tablet 6  . DOCUSATE SODIUM 100 MG PO CAPS Oral Take 100 mg by mouth 2 (two) times daily.    Marland Kitchen HYDROCHLOROTHIAZIDE 25 MG PO TABS Oral Take 1 tablet (25 mg total) by mouth daily. 30 tablet 3  . IBUPROFEN 800 MG PO TABS Oral Take 1 tablet (800 mg total) by mouth every 8 (eight) hours as needed. For pain 30 tablet 2  . MEDROXYPROGESTERONE ACETATE 10 MG PO TABS Oral Take 2 tablets (20 mg total) by mouth daily. 60 tablet 12  . PHENTERMINE HCL 37.5 MG PO TABS Oral Take 37.5 mg by mouth daily before breakfast.    . TOPIRAMATE 25 MG PO TABS  TAKE 1 TABLET TWICE DAILY 60 tablet 2  . VALACYCLOVIR HCL 500 MG PO TABS  TAKE 1 TABLET BY MOUTH EVERY DAY 90 tablet 3  . ZOLPIDEM TARTRATE 5 MG PO TABS Oral Take 5 mg by mouth at bedtime as needed. For sleep     . FLUTICASONE PROPIONATE 50 MCG/ACT NA SUSP Nasal 2 sprays by Nasal route daily. 16 g 2    BP 144/88  Pulse 91  Temp 98.4 F (36.9 C)  Resp 16  SpO2 98%  LMP 02/11/2012  Physical Exam  Nursing note  and vitals reviewed. Constitutional: She is oriented to person, place, and time. She appears well-developed and well-nourished. No distress.  HENT:  Head: Normocephalic and atraumatic.  Eyes: EOM are normal. Pupils are equal, round, and reactive to light.  Musculoskeletal: Normal range of motion. She exhibits tenderness.       Left wrist: She exhibits tenderness and bony tenderness. She exhibits normal range of motion, no effusion and no deformity.       Right knee: She exhibits normal range of motion, no swelling, no effusion, no deformity and no erythema. tenderness found. Medial joint line tenderness noted.       Patient is obese and it is difficult to evaluate if there is any true swelling. She also has posterior knee pain  Neurological: She is alert and oriented to person, place, and time. No cranial nerve deficit.  Skin: Skin is warm and dry. No rash noted.  Psychiatric: She has a normal mood and affect. Her behavior is normal.    ED Course  Procedures (including critical care time)  Labs Reviewed - No data to display Dg Wrist Complete Left  04/17/2012  *RADIOLOGY REPORT*  Clinical Data: Fall, left wrist pain  LEFT WRIST - COMPLETE 3+ VIEW  Comparison: None.  Findings: No fracture or dislocation is seen.  The joint spaces are preserved.  Visualized soft tissues are grossly unremarkable.  IMPRESSION: No fracture or dislocation is seen.  Original Report Authenticated By: Charline Bills, M.D.   Dg Knee Complete 4 Views Right  04/17/2012  *RADIOLOGY REPORT*  Clinical Data: Fall, anterior knee pain  RIGHT KNEE - COMPLETE 4+ VIEW  Comparison: 02/03/2012  Findings: No fracture or dislocation is seen.  The joint spaces are preserved.  Visualized soft tissues are grossly unremarkable.  No definite suprapatellar knee joint effusion.  IMPRESSION: No fracture or dislocation is seen.  Original Report Authenticated By: Charline Bills, M.D.     1. Knee sprain   2. Wrist sprain       MDM    Patient with a mechanical fall yesterday when she was running into the garage to get out of the rain. She fell on her right knee and left wrist and has had pain in these areas since.  On exam there is no appreciable swelling but she has pain along the medial joint line of the knee and diffuse pain in the left wrist. Patient given pain control and plain films are pending.   Plain films neg     Gwyneth Sprout, MD 04/17/12 2004

## 2012-04-17 NOTE — ED Notes (Signed)
To ED for eval of right knee pain and left arm pain past falling in wet garage yesterday. Ambulatory without difficulty. Denies hitting head.

## 2012-04-19 ENCOUNTER — Other Ambulatory Visit: Payer: Self-pay | Admitting: Family Medicine

## 2012-04-19 MED ORDER — ZOLPIDEM TARTRATE 5 MG PO TABS: 5.0000 mg | ORAL_TABLET | Freq: Every evening | ORAL | Status: DC | PRN

## 2012-04-19 NOTE — Progress Notes (Signed)
Will fax rx in

## 2012-04-20 ENCOUNTER — Ambulatory Visit (INDEPENDENT_AMBULATORY_CARE_PROVIDER_SITE_OTHER): Payer: Medicaid Other | Admitting: Family Medicine

## 2012-04-20 VITALS — BP 142/88 | HR 79 | Temp 98.3°F | Ht 61.0 in | Wt 292.2 lb

## 2012-04-20 DIAGNOSIS — Z9181 History of falling: Secondary | ICD-10-CM

## 2012-04-20 DIAGNOSIS — R296 Repeated falls: Secondary | ICD-10-CM

## 2012-04-20 DIAGNOSIS — M25569 Pain in unspecified knee: Secondary | ICD-10-CM

## 2012-04-20 MED ORDER — MISC. DEVICES MISC: Status: DC

## 2012-04-20 NOTE — Progress Notes (Signed)
  Subjective:    Patient ID: Jessica [name], female    DOB: [birth year], 51 y.o.   MRN: [id number]  HPI Patient comes in today after falling on Friday on her driveway. She slipped in the rain. On Saturday she went to the ED due to knee pain. There she received an evaluation and an x-ray showing a normal knee. She received a prescription for Percocet which she says makes her sleepy and she cannot take it. Because she is still in pain she came for evaluation today.  Patient states that she has had frequent problems with the right knee and that she will frequently fall due to it giving out.   Review of Systems Denies shortness of breath, chest pain    Objective:   Physical Exam Vital signs reviewed General appearance - alert, well appearing, and in no distress MSK-right knee is without warmth, erythema, bruising. Exam limited by body habitus but no effusion evident. Full range of motion and normal endpoints on ligaments with negative Lochmann. patient is tender to palpation at medial joint line but with a negative McMurrays       Assessment & Plan:

## 2012-04-20 NOTE — Assessment & Plan Note (Signed)
Frequent falls. Patient feels this is due to knee giving out. I will send her to physical therapy for evaluation and treatment.

## 2012-04-20 NOTE — Assessment & Plan Note (Signed)
Chronic pain in the right knee exacerbated by fall on Friday. Reviewed x-rays from the ED which showed normal knee. Patient is without swelling, bruising today. Will ask her to continue ice and gave her an Ace wrap for support. Asked her to use Motrin 3 times a day for the next 5 days. She is to see Dr. [place] for followup.

## 2012-04-20 NOTE — Patient Instructions (Signed)
Please stop using the Tylenol if you're going to take Percocet Both Percocet and Tylenol contain acetaminophen (Tylenol)  Please take Motrin, 2 tabs, 3 times a day for the next 3-5 days Please continue to use ice Use the Ace wrap for support  I am sending in a referral to physical therapy We will call you with the information I have also given you a prescription for a shower chair You can pick this up at [facility] medical supply This may or may not be covered by your insurance

## 2012-05-05 ENCOUNTER — Encounter (HOSPITAL_BASED_OUTPATIENT_CLINIC_OR_DEPARTMENT_OTHER): Payer: Self-pay

## 2012-05-05 ENCOUNTER — Emergency Department (HOSPITAL_BASED_OUTPATIENT_CLINIC_OR_DEPARTMENT_OTHER)
Admission: EM | Admit: 2012-05-05 | Discharge: 2012-05-05 | Disposition: A | Discharge status: Home / Self Care | Payer: Medicaid Other | Attending: Emergency Medicine | Admitting: Emergency Medicine

## 2012-05-05 DIAGNOSIS — G4730 Sleep apnea, unspecified: Secondary | ICD-10-CM | POA: Insufficient documentation

## 2012-05-05 DIAGNOSIS — I10 Essential (primary) hypertension: Secondary | ICD-10-CM | POA: Insufficient documentation

## 2012-05-05 DIAGNOSIS — B9689 Other specified bacterial agents as the cause of diseases classified elsewhere: Secondary | ICD-10-CM

## 2012-05-05 DIAGNOSIS — F3289 Other specified depressive episodes: Secondary | ICD-10-CM | POA: Insufficient documentation

## 2012-05-05 DIAGNOSIS — N949 Unspecified condition associated with female genital organs and menstrual cycle: Secondary | ICD-10-CM | POA: Insufficient documentation

## 2012-05-05 DIAGNOSIS — F329 Major depressive disorder, single episode, unspecified: Secondary | ICD-10-CM | POA: Insufficient documentation

## 2012-05-05 DIAGNOSIS — N898 Other specified noninflammatory disorders of vagina: Secondary | ICD-10-CM | POA: Insufficient documentation

## 2012-05-05 LAB — URINALYSIS, ROUTINE W REFLEX MICROSCOPIC
Bilirubin Urine: NEGATIVE
Leukocytes, UA: NEGATIVE
Nitrite: NEGATIVE
Specific Gravity, Urine: 1.027 (ref 1.005–1.030)
pH: 6 (ref 5.0–8.0)

## 2012-05-05 LAB — WET PREP, GENITAL: Trich, Wet Prep: NONE SEEN

## 2012-05-05 MED ORDER — METRONIDAZOLE 500 MG PO TABS: 500.0000 mg | ORAL_TABLET | Freq: Two times a day (BID) | ORAL | Status: DC

## 2012-05-05 NOTE — ED Notes (Signed)
C/o lower abd pain/pelvic pain/vaginal discharge-started yesterday

## 2012-05-05 NOTE — ED Provider Notes (Signed)
History     CSN: [id number]  Arrival date & time 05/05/12  2113   First MD Initiated Contact with Patient 05/05/12 2137      Chief Complaint  Patient presents with  . Pelvic Pain  . Vaginal Discharge    (Consider location/radiation/quality/duration/timing/severity/associated sxs/prior treatment) HPI Comments: Noticed vaginal discharge two days ago.  Is sexually active with fiance, uses condoms for contraception.    Patient is a 51 y.o. female presenting with pelvic pain and vaginal discharge. The history is provided by the patient.  Pelvic Pain This is a new problem. The current episode started 2 days ago. The problem occurs constantly. The problem has been gradually worsening. Pertinent negatives include no abdominal pain. The symptoms are aggravated by nothing. The symptoms are relieved by nothing.  Vaginal Discharge Pertinent negatives include no abdominal pain.    Past Medical History  Diagnosis Date  . Hypertension   . Obesity   . HSV (herpes simplex virus) infection   . Headache   . Depression      on meds  . BV (bacterial vaginosis)     recent dx - finish med 12/22/11  . Insomnia     on meds  . Sleep apnea     occas. uses CPAP  . Anal fissure     history  . Arthritis     feet    Past Surgical History  Procedure Date  . Neck surgery 2001    cervical fusion C4/5  . Cesarean section     x 3  . Tubal ligation 1992  . Cholecystectomy   . Endometrial ablation     Family History  Problem Relation Age of Onset  . Diabetes Mother   . Diabetes Father   . Diabetes Sister   . Cancer Paternal Aunt     cevical  . Cancer Paternal Aunt     cervical    History  Substance Use Topics  . Smoking status: Never Smoker   . Smokeless tobacco: Never Used  . Alcohol Use: Yes     socially    OB History    Grav Para Term Preterm Abortions TAB SAB Ect Mult Living                  Review of Systems  Gastrointestinal: Negative for abdominal pain.    Genitourinary: Positive for vaginal discharge and pelvic pain.  All other systems reviewed and are negative.    Allergies  Ace inhibitors; Angiotensin receptor blockers; Lisinopril; and Latex  Home Medications   Current Outpatient Rx  Name Route Sig Dispense Refill  . ACETAMINOPHEN 500 MG PO TABS Oral Take 2 tablets (1,000 mg total) by mouth every 6 (six) hours as needed. For pain 30 tablet 2  . BUPROPION HCL ER (XL) 300 MG PO TB24 Oral Take 1 tablet (300 mg total) by mouth every morning. 90 tablet 3  . CVS STOOL SOFTENER 100 MG PO CAPS  TAKE 1 CAPSULE BY MOUTH 2 TIMES A DAY 60 capsule 1  . CYCLOBENZAPRINE HCL 10 MG PO TABS Oral Take 1 tablet (10 mg total) by mouth 3 (three) times daily as needed for muscle spasms. For back spasms 30 tablet 6  . DOCUSATE SODIUM 100 MG PO CAPS Oral Take 100 mg by mouth 2 (two) times daily.    Marland Kitchen FLUTICASONE PROPIONATE 50 MCG/ACT NA SUSP Nasal Place 2 sprays into the nose daily.    Marland Kitchen HYDROCHLOROTHIAZIDE 25 MG PO TABS  TAKE 1 TABLET (  25 MG TOTAL) BY MOUTH DAILY. 30 tablet 3  . IBUPROFEN 800 MG PO TABS Oral Take 1 tablet (800 mg total) by mouth every 8 (eight) hours as needed. For pain 30 tablet 2  . MEDROXYPROGESTERONE ACETATE 10 MG PO TABS Oral Take 2 tablets (20 mg total) by mouth daily. 60 tablet 12  . OXYCODONE-ACETAMINOPHEN 5-325 MG PO TABS Oral Take 1 tablet by mouth every 4 (four) hours as needed.    Marland Kitchen PHENTERMINE HCL 37.5 MG PO TABS Oral Take 37.5 mg by mouth daily before breakfast.    . MISC. DEVICES MISC  Please provide shower chair Diagnosis V15.88 1 each 0  . TOPIRAMATE 25 MG PO TABS  TAKE 1 TABLET TWICE DAILY 60 tablet 2  . VALACYCLOVIR HCL 500 MG PO TABS  TAKE 1 TABLET BY MOUTH EVERY DAY 90 tablet 3  . ZOLPIDEM TARTRATE 5 MG PO TABS Oral Take 1 tablet (5 mg total) by mouth at bedtime as needed. For sleep 30 tablet 0    BP 143/86  Pulse 92  Temp 98.4 F (36.9 C)  Resp 20  Ht 5\' 1"  (1.549 m)  Wt 282 lb (127.914 kg)  BMI 53.28 kg/m2   SpO2 100%  Physical Exam  Nursing note and vitals reviewed. Constitutional: She is oriented to person, place, and time. She appears well-developed and well-nourished. No distress.  HENT:  Head: Normocephalic and atraumatic.  Neck: Normal range of motion. Neck supple.  Genitourinary: Uterus normal. Vaginal discharge found.       Slight vaginal discharge.    Musculoskeletal: Normal range of motion.  Neurological: She is alert and oriented to person, place, and time.  Skin: Skin is warm and dry. She is not diaphoretic.    ED Course  Procedures (including critical care time)  Labs Reviewed  URINALYSIS, ROUTINE W REFLEX MICROSCOPIC - Abnormal; Notable for the following:    APPearance CLOUDY (*)    All other components within normal limits  WET PREP, GENITAL  GC/CHLAMYDIA PROBE AMP, GENITAL   No results found.   No diagnosis found.    MDM  The wet prep shows clue cells and few wbc.  Will discharge to home with flagyl, pending cultures.          Geoffery Lyons, MD 05/05/12 [phone]

## 2012-05-05 NOTE — Discharge Instructions (Signed)
Bacterial Vaginosis Bacterial vaginosis (BV) is a vaginal infection where the normal balance of bacteria in the vagina is disrupted. The normal balance is then replaced by an overgrowth of certain bacteria. There are several different kinds of bacteria that can cause BV. BV is the most common vaginal infection in women of childbearing age. CAUSES   The cause of BV is not fully understood. BV develops when there is an increase or imbalance of harmful bacteria.   Some activities or behaviors can upset the normal balance of bacteria in the vagina and put women at increased risk including:   Having a new sex partner or multiple sex partners.   Douching.   Using an intrauterine device (IUD) for contraception.   It is not clear what role sexual activity plays in the development of BV. However, women that have never had sexual intercourse are rarely infected with BV.  Women do not get BV from toilet seats, bedding, swimming pools or from touching objects around them.  SYMPTOMS   Grey vaginal discharge.   A fish-like odor with discharge, especially after sexual intercourse.   Itching or burning of the vagina and vulva.   Burning or pain with urination.   Some women have no signs or symptoms at all.  DIAGNOSIS  Your caregiver must examine the vagina for signs of BV. Your caregiver will perform lab tests and look at the sample of vaginal fluid through a microscope. They will look for bacteria and abnormal cells (clue cells), a pH test higher than 4.5, and a positive amine test all associated with BV.  RISKS AND COMPLICATIONS   Pelvic inflammatory disease (PID).   Infections following gynecology surgery.   Developing HIV.   Developing herpes virus.  TREATMENT  Sometimes BV will clear up without treatment. However, all women with symptoms of BV should be treated to avoid complications, especially if gynecology surgery is planned. Female partners generally do not need to be treated. However,  BV may spread between female sex partners so treatment is helpful in preventing a recurrence of BV.   BV may be treated with antibiotics. The antibiotics come in either pill or vaginal cream forms. Either can be used with nonpregnant or pregnant women, but the recommended dosages differ. These antibiotics are not harmful to the baby.   BV can recur after treatment. If this happens, a second round of antibiotics will often be prescribed.   Treatment is important for pregnant women. If not treated, BV can cause a premature delivery, especially for a pregnant woman who had a premature birth in the past. All pregnant women who have symptoms of BV should be checked and treated.   For chronic reoccurrence of BV, treatment with a type of prescribed gel vaginally twice a week is helpful.  HOME CARE INSTRUCTIONS   Finish all medication as directed by your caregiver.   Do not have sex until treatment is completed.   Tell your sexual partner that you have a vaginal infection. They should see their caregiver and be treated if they have problems, such as a mild rash or itching.   Practice safe sex. Use condoms. Only have 1 sex partner.  PREVENTION  Basic prevention steps can help reduce the risk of upsetting the natural balance of bacteria in the vagina and developing BV:  Do not have sexual intercourse (be abstinent).   Do not douche.   Use all of the medicine prescribed for treatment of BV, even if the signs and symptoms go away.     Tell your sex partner if you have BV. That way, they can be treated, if needed, to prevent reoccurrence.  SEEK MEDICAL CARE IF:   Your symptoms are not improving after 3 days of treatment.   You have increased discharge, pain, or fever.  MAKE SURE YOU:   Understand these instructions.   Will watch your condition.   Will get help right away if you are not doing well or get worse.  FOR MORE INFORMATION  Division of STD Prevention (DSTDP), Centers for Disease  Control and Prevention: www.cdc.gov/std American Social Health Association (ASHA): www.ashastd.org  Document Released: 12/15/2005 Document Revised: 12/04/2011 Document Reviewed: 06/07/2009 ExitCare Patient Information 2012 [employer]. 

## 2012-05-10 ENCOUNTER — Emergency Department (HOSPITAL_COMMUNITY)
Admission: EM | Admit: 2012-05-10 | Discharge: 2012-05-10 | Disposition: A | Discharge status: Home / Self Care | Payer: Medicaid Other | Attending: Emergency Medicine | Admitting: Emergency Medicine

## 2012-05-10 ENCOUNTER — Emergency Department (HOSPITAL_COMMUNITY): Payer: Medicaid Other

## 2012-05-10 ENCOUNTER — Encounter (HOSPITAL_COMMUNITY): Payer: Self-pay | Admitting: *Deleted

## 2012-05-10 DIAGNOSIS — R1033 Periumbilical pain: Secondary | ICD-10-CM | POA: Insufficient documentation

## 2012-05-10 DIAGNOSIS — E669 Obesity, unspecified: Secondary | ICD-10-CM | POA: Insufficient documentation

## 2012-05-10 DIAGNOSIS — G4730 Sleep apnea, unspecified: Secondary | ICD-10-CM | POA: Insufficient documentation

## 2012-05-10 DIAGNOSIS — Z8739 Personal history of other diseases of the musculoskeletal system and connective tissue: Secondary | ICD-10-CM | POA: Insufficient documentation

## 2012-05-10 DIAGNOSIS — I10 Essential (primary) hypertension: Secondary | ICD-10-CM | POA: Insufficient documentation

## 2012-05-10 DIAGNOSIS — K5900 Constipation, unspecified: Secondary | ICD-10-CM | POA: Insufficient documentation

## 2012-05-10 DIAGNOSIS — M549 Dorsalgia, unspecified: Secondary | ICD-10-CM | POA: Insufficient documentation

## 2012-05-10 LAB — COMPREHENSIVE METABOLIC PANEL
Albumin: 3.4 g/dL — ABNORMAL LOW (ref 3.5–5.2)
BUN: 12 mg/dL (ref 6–23)
Creatinine, Ser: 0.66 mg/dL (ref 0.50–1.10)
Total Protein: 8 g/dL (ref 6.0–8.3)

## 2012-05-10 LAB — URINALYSIS, ROUTINE W REFLEX MICROSCOPIC
Ketones, ur: NEGATIVE mg/dL
Leukocytes, UA: NEGATIVE
Nitrite: NEGATIVE
Specific Gravity, Urine: 1.026 (ref 1.005–1.030)
Urobilinogen, UA: 1 mg/dL (ref 0.0–1.0)
pH: 6 (ref 5.0–8.0)

## 2012-05-10 LAB — CBC
HCT: 39.8 % (ref 36.0–46.0)
MCH: 31.2 pg (ref 26.0–34.0)
MCHC: 33.9 g/dL (ref 30.0–36.0)
MCV: 91.9 fL (ref 78.0–100.0)
RDW: 14.1 % (ref 11.5–15.5)

## 2012-05-10 LAB — LIPASE, BLOOD: Lipase: 29 U/L (ref 11–59)

## 2012-05-10 MED ORDER — SODIUM CHLORIDE 0.9 % IV BOLUS (SEPSIS)
1000.0000 mL | Freq: Once | INTRAVENOUS | Status: AC
  Administered 2012-05-10: 1000 mL via INTRAVENOUS

## 2012-05-10 MED ORDER — HYDROMORPHONE HCL PF 1 MG/ML IJ SOLN
1.0000 mg | Freq: Once | INTRAMUSCULAR | Status: AC
  Administered 2012-05-10: 1 mg via INTRAVENOUS
  Filled 2012-05-10: qty 1

## 2012-05-10 MED ORDER — IOHEXOL 300 MG/ML  SOLN
100.0000 mL | Freq: Once | INTRAMUSCULAR | Status: AC | PRN
  Administered 2012-05-10: 100 mL via INTRAVENOUS

## 2012-05-10 MED ORDER — HYDROCODONE-ACETAMINOPHEN 5-500 MG PO TABS: 1.0000 | ORAL_TABLET | Freq: Four times a day (QID) | ORAL | Status: AC | PRN

## 2012-05-10 MED ORDER — IOHEXOL 300 MG/ML  SOLN
20.0000 mL | INTRAMUSCULAR | Status: AC
  Administered 2012-05-10 (×2): 20 mL via ORAL

## 2012-05-10 MED ORDER — ONDANSETRON HCL 4 MG/2ML IJ SOLN
4.0000 mg | Freq: Once | INTRAMUSCULAR | Status: AC
  Administered 2012-05-10: 4 mg via INTRAVENOUS
  Filled 2012-05-10: qty 2

## 2012-05-10 NOTE — Discharge Instructions (Signed)

## 2012-05-10 NOTE — ED Notes (Signed)
Pt states that her stomach and back have been hurting for about a week. Pt states constipation, denies problems with urination. Pt states pain is like cramping feeling. Pt tried ibuprofen and tylenol with no relief.

## 2012-05-10 NOTE — ED Provider Notes (Signed)
History     CSN: [id number]  Arrival date & time 05/10/12  4098   First MD Initiated Contact with Patient 05/10/12 [phone]      Chief Complaint  Patient presents with  . Abdominal Pain    (Consider location/radiation/quality/duration/timing/severity/associated sxs/prior treatment) HPI History provided by patient. Per bottle last week has had persistent abdominal pains. Tonight pain is localized to the periumbilical region. Is sharp in quality and not radiating. Patient had been having some pelvic pain and was evaluated in emergency department last week and diagnosed with bacterial vaginosis started on Flagyl. She's been taking his medications with improved pelvic pain. No fevers or chills. Some nausea but no vomiting or diarrhea. She has felt constipated the last few days. No blood in stools. No change in medications otherwise. She takes Colace and MiraLAX at home and states these medications do not help. Pain is moderate severity without known aggravating or alleviating factors. She has had prior cholecystectomy, tubal ligation and endometrial ablation. Past Medical History  Diagnosis Date  . Hypertension   . Obesity   . HSV (herpes simplex virus) infection   . Headache   . Depression      on meds  . BV (bacterial vaginosis)     recent dx - finish med 12/22/11  . Insomnia     on meds  . Sleep apnea     occas. uses CPAP  . Anal fissure     history  . Arthritis     feet    Past Surgical History  Procedure Date  . Neck surgery 2001    cervical fusion C4/5  . Cesarean section     x 3  . Tubal ligation 1992  . Cholecystectomy   . Endometrial ablation     Family History  Problem Relation Age of Onset  . Diabetes Mother   . Diabetes Father   . Diabetes Sister   . Cancer Paternal Aunt     cevical  . Cancer Paternal Aunt     cervical    History  Substance Use Topics  . Smoking status: Never Smoker   . Smokeless tobacco: Never Used  . Alcohol Use: Yes     socially      OB History    Grav Para Term Preterm Abortions TAB SAB Ect Mult Living                  Review of Systems  Constitutional: Negative for fever and chills.  HENT: Negative for neck pain and neck stiffness.   Eyes: Negative for pain.  Respiratory: Negative for shortness of breath.   Cardiovascular: Negative for chest pain.  Gastrointestinal: Positive for abdominal pain and constipation.  Genitourinary: Negative for dysuria.  Musculoskeletal: Negative for back pain.  Skin: Negative for rash.  Neurological: Negative for headaches.  All other systems reviewed and are negative.    Allergies  Ace inhibitors; Angiotensin receptor blockers; Lisinopril; and Latex  Home Medications   Current Outpatient Rx  Name Route Sig Dispense Refill  . ACETAMINOPHEN 500 MG PO TABS Oral Take 500 mg by mouth every 6 (six) hours as needed. For pain.    Marland Kitchen BUPROPION HCL ER (XL) 300 MG PO TB24 Oral Take 300 mg by mouth daily.    . CYCLOBENZAPRINE HCL 10 MG PO TABS Oral Take 10 mg by mouth 3 (three) times daily as needed. For muscle spasms.    . DOCUSATE SODIUM 100 MG PO CAPS Oral Take 100 mg by mouth  2 (two) times daily.    Marland Kitchen FLUTICASONE PROPIONATE 50 MCG/ACT NA SUSP Nasal Place 2 sprays into the nose daily as needed. For congestion.    Marland Kitchen HYDROCHLOROTHIAZIDE 25 MG PO TABS Oral Take 25 mg by mouth daily.    . IBUPROFEN 800 MG PO TABS Oral Take 800 mg by mouth every 8 (eight) hours as needed. For pain.    Marland Kitchen METRONIDAZOLE 500 MG PO TABS Oral Take 500 mg by mouth 2 (two) times daily. Take for 7 days.  First dose 05/08/2012.    Marland Kitchen METRONIDAZOLE 500 MG PO TABS Oral Take 500 mg by mouth 2 (two) times daily. Take for 7 days.  First dose 05/08/2012.    . OXYCODONE-ACETAMINOPHEN 5-325 MG PO TABS Oral Take 1 tablet by mouth every 4 (four) hours as needed. For pain.    . TOPIRAMATE 25 MG PO TABS Oral Take 25 mg by mouth 2 (two) times daily.    Marland Kitchen VALACYCLOVIR HCL 500 MG PO TABS Oral Take 500 mg by mouth daily.    Marland Kitchen  ZOLPIDEM TARTRATE 5 MG PO TABS Oral Take 5 mg by mouth at bedtime as needed. For insomnia.      BP 151/110  Pulse 93  Temp(Src) 97.8 F (36.6 C) (Oral)  Resp 18  SpO2 98%  Physical Exam  Constitutional: She is oriented to person, place, and time. She appears well-developed and well-nourished.  HENT:  Head: Normocephalic and atraumatic.  Eyes: Conjunctivae and EOM are normal. Pupils are equal, round, and reactive to light.  Neck: Trachea normal. Neck supple. No thyromegaly present.  Cardiovascular: Normal rate, regular rhythm, S1 normal, S2 normal and normal pulses.     No systolic murmur is present   No diastolic murmur is present  Pulses:      Radial pulses are 2+ on the right side, and 2+ on the left side.  Pulmonary/Chest: Effort normal and breath sounds normal. She has no wheezes. She has no rhonchi. She has no rales. She exhibits no tenderness.  Abdominal: Soft. Normal appearance and bowel sounds are normal. There is no CVA tenderness and negative Murphy's sign.       Tender periumbilical region without mass or peritonitis. Exam limited by body habitus  Musculoskeletal:       BLE:s Calves nontender, no cords or erythema, negative Homans sign  Neurological: She is alert and oriented to person, place, and time. She has normal strength. No cranial nerve deficit or sensory deficit. GCS eye subscore is 4. GCS verbal subscore is 5. GCS motor subscore is 6.  Skin: Skin is warm and dry. No rash noted. She is not diaphoretic.  Psychiatric: Her speech is normal.       Cooperative and appropriate    ED Course  Procedures (including critical care time)  Results for orders placed during the hospital encounter of 05/10/12  CBC      Component Value Range   WBC 8.4  4.0 - 10.5 (K/uL)   RBC 4.33  3.87 - 5.11 (MIL/uL)   Hemoglobin 13.5  12.0 - 15.0 (g/dL)   HCT 62.1  30.8 - 65.7 (%)   MCV 91.9  78.0 - 100.0 (fL)   MCH 31.2  26.0 - 34.0 (pg)   MCHC 33.9  30.0 - 36.0 (g/dL)   RDW 84.6   96.2 - 95.2 (%)   Platelets 350  150 - 400 (K/uL)  COMPREHENSIVE METABOLIC PANEL      Component Value Range   Sodium 138  135 - 145 (mEq/L)   Potassium 4.1  3.5 - 5.1 (mEq/L)   Chloride 102  96 - 112 (mEq/L)   CO2 24  19 - 32 (mEq/L)   Glucose, Bld 104 (*) 70 - 99 (mg/dL)   BUN 12  6 - 23 (mg/dL)   Creatinine, Ser 1.47  0.50 - 1.10 (mg/dL)   Calcium 9.3  8.4 - 82.9 (mg/dL)   Total Protein 8.0  6.0 - 8.3 (g/dL)   Albumin 3.4 (*) 3.5 - 5.2 (g/dL)   AST 24  0 - 37 (U/L)   ALT 19  0 - 35 (U/L)   Alkaline Phosphatase 106  39 - 117 (U/L)   Total Bilirubin 0.1 (*) 0.3 - 1.2 (mg/dL)   GFR calc non Af Amer >90  >90 (mL/min)   GFR calc Af Amer >90  >90 (mL/min)  LIPASE, BLOOD      Component Value Range   Lipase 29  11 - 59 (U/L)  URINALYSIS, ROUTINE W REFLEX MICROSCOPIC      Component Value Range   Color, Urine YELLOW  YELLOW    APPearance CLEAR  CLEAR    Specific Gravity, Urine 1.026  1.005 - 1.030    pH 6.0  5.0 - 8.0    Glucose, UA NEGATIVE  NEGATIVE (mg/dL)   Hgb urine dipstick NEGATIVE  NEGATIVE    Bilirubin Urine NEGATIVE  NEGATIVE    Ketones, ur NEGATIVE  NEGATIVE (mg/dL)   Protein, ur NEGATIVE  NEGATIVE (mg/dL)   Urobilinogen, UA 1.0  0.0 - 1.0 (mg/dL)   Nitrite NEGATIVE  NEGATIVE    Leukocytes, UA NEGATIVE  NEGATIVE   PREGNANCY, URINE      Component Value Range   Preg Test, Ur NEGATIVE  NEGATIVE    Dg Wrist Complete Left  04/17/2012  *RADIOLOGY REPORT*  Clinical Data: Fall, left wrist pain  LEFT WRIST - COMPLETE 3+ VIEW  Comparison: None.  Findings: No fracture or dislocation is seen.  The joint spaces are preserved.  Visualized soft tissues are grossly unremarkable.  IMPRESSION: No fracture or dislocation is seen.  Original Report Authenticated By: Charline Bills, M.D.   Ct Abdomen Pelvis W Contrast  05/10/2012  *RADIOLOGY REPORT*  Clinical Data: Stomach and back pain for a week.  Periumbilical abdominal pain.  CT ABDOMEN AND PELVIS WITH CONTRAST  Technique:   Multidetector CT imaging of the abdomen and pelvis was performed following the standard protocol during bolus administration of intravenous contrast.  Contrast: 1 OMNIPAQUE IOHEXOL 300 MG/ML  SOLN, OMNIPAQUE IOHEXOL 300 MG/ML  SOLN  Comparison: None.  Findings: The lung bases are clear.  Surgical absence of the gallbladder.  Mild low attenuation change in the liver suggesting fatty infiltration.  No focal liver lesions.  The pancreas, spleen, right adrenal gland, kidneys, abdominal aorta, and retroperitoneal lymph nodes are unremarkable. There is a nodule in the left adrenal gland measuring 1.8 cm.  The lesion demonstrates washout on the delayed images consistent with an adenoma.  The stomach, small bowel, and colon are not distended. No free air or free fluid in the abdomen.  Small amount of fat in the umbilical canal.  Pelvis:  The uterus and adnexal structures are not enlarged. Somewhat prominent appearance of the endometrial stripe which may be physiologic.  No free or loculated pelvic fluid collections. The appendix is normal.  Tiny calcification at the appendiceal tip which may represent a small appendicolith.  No significant pelvic lymphadenopathy.  No evidence of diverticulitis.  Normal  alignment of the lumbar vertebrae.  IMPRESSION: No focal acute process demonstrated in the abdomen or pelvis that would account for the patient's pain.  Prominence of the endometrium which might represent physiologic change.  Correlation with the last menstrual period is recommended.  Original Report Authenticated By: Marlon Pel, M.D.    Old records reviewed was seen here 05/05/12 for lower abdominal pain had a pelvic exam with negative GC Chlamydia and rare clue cells on wet prep, treated with Flagyl.  MDM   Abdominal pain and constipation. Evaluated with UA and labs and CAT scan as above. Pain improved with IV Dilaudid. CT scans reviewed with patient. At this point she is mostly concerned about her  constipation. Plan GI referral, continue MiraLAX and Colace at home. Patient agrees to stay hydrated and return here or be evaluated sooner for any worsening condition. She is stable for discharge home at this time.        Sunnie Nielsen, MD 05/10/12 [phone]

## 2012-05-11 ENCOUNTER — Telehealth: Payer: Self-pay | Admitting: Family Medicine

## 2012-05-11 NOTE — Telephone Encounter (Signed)
Please call patient regarding referral to Dr. Kenna Gilbert office for gi consult

## 2012-05-12 NOTE — Telephone Encounter (Signed)
Patient states she has had a problem with constipation for a month . When she comes in here she can only be seen for one problem for work in and her leg has been her main issue. Went to ED yesterday for abdominal pain. Was told needs GI referral and she called to schedule and was told Dr. Kenna Gilbert office is not on call this month. Advised that she will need follow up here to get a referral from this office.  Patient voices understanding. Dr. Elvis Coil next available is 06/03 so scheduled appointment with Dr. Earnest Bailey tomorrow.

## 2012-05-12 NOTE — Telephone Encounter (Signed)
Would like to talk to nurse about what to do in the mean time before she gets referral to GI -

## 2012-05-13 ENCOUNTER — Ambulatory Visit (INDEPENDENT_AMBULATORY_CARE_PROVIDER_SITE_OTHER): Payer: Medicaid Other | Admitting: Family Medicine

## 2012-05-13 ENCOUNTER — Encounter: Payer: Self-pay | Admitting: Family Medicine

## 2012-05-13 DIAGNOSIS — K5900 Constipation, unspecified: Secondary | ICD-10-CM | POA: Insufficient documentation

## 2012-05-13 MED ORDER — SENNA-DOCUSATE SODIUM 8.6-50 MG PO TABS: 1.0000 | ORAL_TABLET | Freq: Every day | ORAL | Status: DC

## 2012-05-13 NOTE — Assessment & Plan Note (Signed)
Constipation, no red flags.  Not compliant with a regular bowel regimen with narcotic use.  Will start daily miralax 1-2x per day.  Senna prn.  Giveni nfo on high fiber diet, advised on daily exercise and increased water intake. May consider probiotics given chronic abdominal bloating, possibly disruption after colon cleanse.  Follow-up in 1 month with PCP

## 2012-05-13 NOTE — Progress Notes (Signed)
  Subjective:    Patient ID: Jessica [name], female    DOB: 1961/03/28, 51 y.o.   MRN: [id number]  HPI Here to discuss constipation  9 month ago took a colon cleanser and since then, has been constipated.  Typically has a daily bowel movement, but at times only has one once a week.  Notes straining with bowel movements.  Hard BM's.  Last BM 4 days ago. No blood.  Feels bloated.     Has tried- Miralax, intermittently.  Dulcolax 2 tablets not improved.  I have reviewed patient's  PMH, FH, and Social history and Medications as related to this visit. Went to ER, CBC, CMET, U/A, upreg, CT abdomen normal.   Review of Systems See HPI    Objective:   Physical Exam GEN: Alert & Oriented, No acute distress CV:  Regular Rate & Rhythm, no murmur Respiratory:  Normal work of breathing, CTAB Abd:  + BS, soft, no tenderness to palpation Ext: no pre-tibial edema        Assessment & Plan:

## 2012-05-13 NOTE — Patient Instructions (Addendum)
Miralax powder- 1 capful once to twice a day.  Use your stools as a guide.  Senna tablets to help get your colon moving  See handout on high fiber diet  Consider probiotics supplement (lactobacillus, acidophilius) if you continue to have pain and bloating  Make a follow-up in 1 month with your primary doctor.

## 2012-05-21 ENCOUNTER — Encounter (HOSPITAL_COMMUNITY): Payer: Self-pay | Admitting: Emergency Medicine

## 2012-05-21 ENCOUNTER — Emergency Department (HOSPITAL_COMMUNITY)
Admission: EM | Admit: 2012-05-21 | Discharge: 2012-05-21 | Disposition: A | Discharge status: Home / Self Care | Payer: Medicaid Other | Attending: Emergency Medicine | Admitting: Emergency Medicine

## 2012-05-21 DIAGNOSIS — M25569 Pain in unspecified knee: Secondary | ICD-10-CM | POA: Insufficient documentation

## 2012-05-21 DIAGNOSIS — M79604 Pain in right leg: Secondary | ICD-10-CM

## 2012-05-21 DIAGNOSIS — G8929 Other chronic pain: Secondary | ICD-10-CM | POA: Insufficient documentation

## 2012-05-21 DIAGNOSIS — I10 Essential (primary) hypertension: Secondary | ICD-10-CM | POA: Insufficient documentation

## 2012-05-21 DIAGNOSIS — Z79899 Other long term (current) drug therapy: Secondary | ICD-10-CM | POA: Insufficient documentation

## 2012-05-21 DIAGNOSIS — M79609 Pain in unspecified limb: Secondary | ICD-10-CM

## 2012-05-21 DIAGNOSIS — G4730 Sleep apnea, unspecified: Secondary | ICD-10-CM | POA: Insufficient documentation

## 2012-05-21 DIAGNOSIS — G4700 Insomnia, unspecified: Secondary | ICD-10-CM | POA: Insufficient documentation

## 2012-05-21 MED ORDER — HYDROMORPHONE HCL PF 1 MG/ML IJ SOLN
1.0000 mg | Freq: Once | INTRAMUSCULAR | Status: AC
  Administered 2012-05-21: 1 mg via INTRAMUSCULAR
  Filled 2012-05-21: qty 1

## 2012-05-21 NOTE — Progress Notes (Signed)
VASCULAR LAB PRELIMINARY  PRELIMINARY  PRELIMINARY  PRELIMINARY  Right lower extremity venous duplex completed.    Preliminary report:  Right:  No evidence of DVT, superficial thrombosis, or Baker's cyst.  Terance Hart, RVT 05/21/2012, 8:44 AM

## 2012-05-21 NOTE — ED Notes (Addendum)
Patient complaining of right leg pain for the past several days; patient states that she has been diagnosed with arthritis in her right knee.  Pain states pain radiates all the way down right leg; reports swelling.  Denies injury to leg.

## 2012-05-21 NOTE — ED Notes (Signed)
Pt is waiting for the Vascular Tech to do her Doppler. Pt is NAD

## 2012-05-21 NOTE — ED Provider Notes (Signed)
Medical screening examination/treatment/procedure(s) were performed by non-physician practitioner and as supervising physician I was immediately available for consultation/collaboration.  Reba Hulett M Andrez Lieurance, MD 05/21/12 0657 

## 2012-05-21 NOTE — ED Notes (Signed)
Pt c/o right leg pain x 3 days. 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 States pain became worse three days ago and has now spread from her thigh to her foot. Denies new injury. States she was taking percocet and oxycontin for pain but ran out. Took vicodin today at 0230 without relief. Pt resting with family at bedside.

## 2012-05-21 NOTE — ED Provider Notes (Signed)
Medical screening examination/treatment/procedure(s) were performed by non-physician practitioner and as supervising physician I was immediately available for consultation/collaboration.  Alessandro Griep M Lenoria Narine, MD 05/21/12 0656 

## 2012-05-21 NOTE — Discharge Instructions (Signed)
It is VERY important to follow up with your primary care provider for further management of your chronic knee pain but return to ER for emergent changing of symptoms.

## 2012-05-21 NOTE — ED Provider Notes (Signed)
History     CSN: [id number]  Arrival date & time 05/21/12  0225   First MD Initiated Contact with Patient 05/21/12 [phone]      Chief Complaint  Patient presents with  . Leg Pain    (Consider location/radiation/quality/duration/timing/severity/associated sxs/prior treatment) HPI Comments: Jessica [name] is a 51 year old morbidly obese, female, who has been complaining of right knee.  Pain for several, months.  She's had it x-rayed twice, which reveals no pathology, there, not even reading out arthritis.  Her physician has been treating her with Vicodin, and for the pain without any relief.  She states the leg feels swollen to her.  Patient is a 51 y.o. female presenting with leg pain. The history is provided by the patient.  Leg Pain  The incident occurred more than 1 week ago. There was no injury mechanism. The pain is present in the right knee.    Past Medical History  Diagnosis Date  . Hypertension   . Obesity   . HSV (herpes simplex virus) infection   . Headache   . Depression      on meds  . BV (bacterial vaginosis)     recent dx - finish med 12/22/11  . Insomnia     on meds  . Sleep apnea     occas. uses CPAP  . Anal fissure     history  . Arthritis     feet    Past Surgical History  Procedure Date  . Neck surgery 2001    cervical fusion C4/5  . Cesarean section     x 3  . Tubal ligation 1992  . Cholecystectomy   . Endometrial ablation     Family History  Problem Relation Age of Onset  . Diabetes Mother   . Diabetes Father   . Diabetes Sister   . Cancer Paternal Aunt     cevical  . Cancer Paternal Aunt     cervical    History  Substance Use Topics  . Smoking status: Never Smoker   . Smokeless tobacco: Never Used  . Alcohol Use: Yes     socially    OB History    Grav Para Term Preterm Abortions TAB SAB Ect Mult Living                  Review of Systems  Constitutional: Negative for fever.  Respiratory: Negative for shortness of breath.     Musculoskeletal: Negative for joint swelling.  Skin: Negative for rash.  Neurological: Negative for dizziness.    Allergies  Ace inhibitors; Angiotensin receptor blockers; Lisinopril; and Latex  Home Medications   Current Outpatient Rx  Name Route Sig Dispense Refill  . ACETAMINOPHEN 500 MG PO TABS Oral Take 500 mg by mouth every 6 (six) hours as needed. For pain.    Marland Kitchen BUPROPION HCL ER (XL) 300 MG PO TB24 Oral Take 300 mg by mouth daily.    . CYCLOBENZAPRINE HCL 10 MG PO TABS Oral Take 10 mg by mouth 3 (three) times daily as needed. For muscle spasms.    Marland Kitchen FLUTICASONE PROPIONATE 50 MCG/ACT NA SUSP Nasal Place 2 sprays into the nose daily as needed. For congestion.    Marland Kitchen HYDROCHLOROTHIAZIDE 25 MG PO TABS Oral Take 25 mg by mouth daily.    Marland Kitchen HYDROCODONE-ACETAMINOPHEN 5-500 MG PO TABS Oral Take 1 tablet by mouth every 6 (six) hours as needed. For pain    . IBUPROFEN 800 MG PO TABS Oral Take 800 mg  by mouth every 8 (eight) hours as needed. For pain.    . OXYCODONE-ACETAMINOPHEN 5-325 MG PO TABS Oral Take 1 tablet by mouth every 4 (four) hours as needed. For pain.    Marland Kitchen POLYETHYLENE GLYCOL 3350 PO POWD Oral Take 17 g by mouth 2 (two) times daily.    . SENNA-DOCUSATE SODIUM 8.6-50 MG PO TABS Oral Take 1-2 tablets by mouth daily. 60 tablet 5  . TOPIRAMATE 25 MG PO TABS Oral Take 25 mg by mouth 2 (two) times daily.    Marland Kitchen VALACYCLOVIR HCL 500 MG PO TABS Oral Take 500 mg by mouth daily.    Marland Kitchen ZOLPIDEM TARTRATE 5 MG PO TABS Oral Take 5 mg by mouth at bedtime as needed. For insomnia.    Marland Kitchen HYDROCODONE-ACETAMINOPHEN 5-500 MG PO TABS Oral Take 1-2 tablets by mouth every 6 (six) hours as needed for pain. 5 tablet 0    BP 147/69  Pulse 97  Temp(Src) 98.1 F (36.7 C) (Oral)  Resp 20  SpO2 97%  Physical Exam  Constitutional: She appears well-developed and well-nourished.       Obese  HENT:  Head: Normocephalic.  Eyes: Pupils are equal, round, and reactive to light.  Neck: Normal range of motion.   Cardiovascular: Normal rate.   Pulmonary/Chest: Effort normal.  Musculoskeletal:       Right knee: She exhibits decreased range of motion. She exhibits no erythema. no tenderness found.       Legs: Neurological: She is alert.  Skin: Skin is warm.    ED Course  Procedures (including critical care time)  Labs Reviewed - No data to display No results found.   No diagnosis found.    MDM  There is concern for a Baker's cyst on patient is agreeable to stay several more hours until the vascular technologist is available to obtain a study        Jessica Filter, NP 05/21/12 0533

## 2012-05-21 NOTE — ED Provider Notes (Signed)
Sign out by Elsie Stain. Patient with chronic knee pain that is followed by PCP but with some popliteal fossa TTP but no erythema or signs or infection is pending doppler US to r/o DVT. If negative then most likely her ongoing chronic knee pain that is appropriate for ongoing OP management by PCP. Patient was given a shot of dilaudid for pain in ER.   [place], [place] 05/21/12 [phone]

## 2012-05-31 ENCOUNTER — Ambulatory Visit (INDEPENDENT_AMBULATORY_CARE_PROVIDER_SITE_OTHER): Payer: Medicaid Other | Admitting: Family Medicine

## 2012-05-31 ENCOUNTER — Ambulatory Visit (HOSPITAL_COMMUNITY)
Admission: RE | Admit: 2012-05-31 | Discharge: 2012-05-31 | Disposition: A | Discharge status: Home / Self Care | Payer: Medicaid Other | Source: Ambulatory Visit | Attending: Family Medicine | Admitting: Family Medicine

## 2012-05-31 VITALS — BP 139/91

## 2012-05-31 DIAGNOSIS — M19079 Primary osteoarthritis, unspecified ankle and foot: Secondary | ICD-10-CM

## 2012-05-31 DIAGNOSIS — M25569 Pain in unspecified knee: Secondary | ICD-10-CM | POA: Insufficient documentation

## 2012-05-31 NOTE — Progress Notes (Signed)
Patient ID: Jessica [name], female   DOB: Jan 05, 1961, 51 y.o.   MRN: [id number]  HPI:  1. Left big toe: having a lot of pain, esp with closed toe shoes; pain has been present for a few years, usually steroid injections help for a few months Pain is sharp and achy/throbbing; all of the time when shoes are on Better with flip flops, but still has pain with walking + swelling in ankle but not toe, no redness No h/o gout, was told it is arthritis  Has been doing toe exercises, which don't seem to be helping Has tried motrin, aleve, tylenol, none of which help  2. Right leg/knee: has gone to ED, chronic problem for a few months, pain has been getting worse-- went to beach last weekend but went to the ED the day before she went Was given a shot in the ED (IM pain med), couldn't walk once she got to the beach because of pain radiating from knee to foot Pain is now throbbing/aching Knee is swelling, no redness Fell ~1 month ago on her knee (was running and floor was slippery, landed on knee) Elevating helps, given percocet in ED which helps the pain a little bit Worse with walking/movement, better with standing Is trying to lose weight, but having difficulties because of pain now   PE: Filed Vitals:   05/31/12 1635  BP: 139/91   Gen: NAD, obese Ext: Right knee: no swelling or erythema, + TTP over medial aspect of patella Left foot:no swelling or redness, + TTP over great toe 1st joint  ULTRASOUND:  Significant loss of joint space at the IP joint left great toe. Osteophytes and spurring noted. No effusion.  INJECTION: Patient was given informed consent, signed copy in the chart. Appropriate time out was taken. Area prepped and draped in usual sterile fashion. One half cc of methylprednisolone 40 mg/ml plus  one half cc of 1% lidocaine without epinephrine was injected into the IP joint of the left great toe using a(n) ultrasound-guided approach. The patient tolerated the procedure well.  There were no complications. Post procedure instructions were given.

## 2012-06-01 ENCOUNTER — Encounter: Payer: Self-pay | Admitting: Family Medicine

## 2012-06-01 NOTE — Assessment & Plan Note (Signed)
Ultrasound today showing moderate to severe osteoarthritis. Injection of corticosteroid today. She has been getting extremely good results with many months of pain relief. We'll see how this does for her.

## 2012-06-01 NOTE — Assessment & Plan Note (Signed)
Pain is mostly on the medial joint line. Given her habitus the ultrasound images are not particularly helpful. Will get x-rays to further evaluate joint space. I suspect she has fairly advanced arthritis. Last corticosteroid injection was not particularly helpful.

## 2012-06-04 ENCOUNTER — Telehealth: Payer: Self-pay | Admitting: Family Medicine

## 2012-06-04 NOTE — Telephone Encounter (Signed)
Jessica [name] I looked at her knee films. She has some DJD---there is nothing unexpected on her films. There is nothing surgical at this point. I would recommend she continue her weight loss program as I think this will most likely give her the most relief. As her last CSI was not helpful, I doubt that a second ojne would benefit her. THANKS! Denny Levy PS  Ask her how the toe is doing

## 2012-06-04 NOTE — Telephone Encounter (Signed)
Spoke with pt- gave her x-ray results.   She states her toe is still painful.  Advised her to call Monday if she would like [place] to recheck it Monday afternoon.  Pt agreeable.

## 2012-06-28 ENCOUNTER — Ambulatory Visit (INDEPENDENT_AMBULATORY_CARE_PROVIDER_SITE_OTHER): Payer: Medicaid Other | Admitting: Family Medicine

## 2012-06-28 ENCOUNTER — Encounter: Payer: Self-pay | Admitting: Family Medicine

## 2012-06-28 VITALS — BP 148/92 | HR 87 | Temp 96.8°F | Wt 291.0 lb

## 2012-06-28 DIAGNOSIS — M79606 Pain in leg, unspecified: Secondary | ICD-10-CM

## 2012-06-28 DIAGNOSIS — M79609 Pain in unspecified limb: Secondary | ICD-10-CM | POA: Diagnosis not present

## 2012-06-28 DIAGNOSIS — I10 Essential (primary) hypertension: Secondary | ICD-10-CM

## 2012-06-28 MED ORDER — FLUTICASONE PROPIONATE 50 MCG/ACT NA SUSP: 2.0000 | Freq: Every day | NASAL | Status: DC | PRN

## 2012-06-28 MED ORDER — HYDROCODONE-ACETAMINOPHEN 5-500 MG PO TABS: 1.0000 | ORAL_TABLET | Freq: Four times a day (QID) | ORAL | Status: DC | PRN

## 2012-06-28 MED ORDER — IBUPROFEN 800 MG PO TABS: 800.0000 mg | ORAL_TABLET | Freq: Three times a day (TID) | ORAL | Status: DC | PRN

## 2012-06-28 MED ORDER — BUPROPION HCL ER (XL) 300 MG PO TB24: 300.0000 mg | ORAL_TABLET | Freq: Every day | ORAL | Status: DC

## 2012-06-28 MED ORDER — AMLODIPINE BESYLATE 5 MG PO TABS: 5.0000 mg | ORAL_TABLET | Freq: Every day | ORAL | Status: DC

## 2012-06-28 NOTE — Patient Instructions (Addendum)
It was nice to see you today. I will do the referral for orthopedic surgery to see what they would suggest. I will do a letter for the [facility] - water aerobics could be great for your health. We will add the second blood pressure medicine.   Please come back in 3 weeks for a nurse visit to have your blood pressure checked.

## 2012-06-29 NOTE — Progress Notes (Signed)
  Subjective:    Patient ID: Jessica [name], female    DOB: 08-15-1961, 51 y.o.   MRN: [id number]  HPI 51 yo with chronic knee and toe pain here for follow up. Reports she still has knee pain and toe pain.  Very difficult to function. Interferes with her ability to exercise because the pain is much worse after she walks.  She has not tried water aerobics.  She has been seen in [facility] clinic. Usually injections help, but most recent toe injection did not help much.  States she was told she might need to see an orthopedic surgeon.  Would very much like to lose weight.  Has considered bariatric surgery.  Well aware that weight is a factor in her pain.   Taking antihypertensives as prescribed.  Has been on ACE-I in past, but developed angioedema.        Review of Systems No CP or SOB.      Objective:   Physical Exam  Nursing note and vitals reviewed. Constitutional: She appears well-developed and well-nourished. No distress.       Obese.  Cardiovascular: Normal rate, regular rhythm and normal heart sounds.  Exam reveals no gallop and no friction rub.   No murmur heard. Pulmonary/Chest: Effort normal and breath sounds normal. No respiratory distress. She has no wheezes. She has no rales.  Skin: She is not diaphoretic.  Psychiatric: She has a normal mood and affect. Her behavior is normal. Judgment and thought content normal.          Assessment & Plan:

## 2012-06-29 NOTE — Assessment & Plan Note (Signed)
Pt still with significant pain.  Per last phone note, nothing surgical at this point for the knees, best to continue with plans for weight loss.  Will wait on ortho referral.  Appreciate the input from Sports Medicine.

## 2012-06-29 NOTE — Assessment & Plan Note (Signed)
Consistently with elevated BP.  Pt willing to add second medicine.  H/o angioedema with ACE-I.  Will start amlodipine and follow up 3 weeks for nurse visit for BP check.

## 2012-06-29 NOTE — Assessment & Plan Note (Signed)
Pt interested in water aerobics, but does not know how to swim.  Letter written to ask Y if they could assist wit scholarship program.  Pt will investigate.

## 2012-07-20 ENCOUNTER — Ambulatory Visit (INDEPENDENT_AMBULATORY_CARE_PROVIDER_SITE_OTHER): Payer: Medicaid Other | Admitting: *Deleted

## 2012-07-20 ENCOUNTER — Encounter: Payer: Self-pay | Admitting: Family Medicine

## 2012-07-20 ENCOUNTER — Ambulatory Visit (INDEPENDENT_AMBULATORY_CARE_PROVIDER_SITE_OTHER): Payer: Medicaid Other | Admitting: Family Medicine

## 2012-07-20 VITALS — BP 134/86 | HR 76

## 2012-07-20 VITALS — BP 134/86 | HR 76 | Temp 98.2°F | Ht 61.0 in | Wt 291.4 lb

## 2012-07-20 DIAGNOSIS — I10 Essential (primary) hypertension: Secondary | ICD-10-CM

## 2012-07-20 DIAGNOSIS — G43909 Migraine, unspecified, not intractable, without status migrainosus: Secondary | ICD-10-CM

## 2012-07-20 MED ORDER — KETOROLAC TROMETHAMINE 60 MG/2ML IM SOLN
60.0000 mg | Freq: Once | INTRAMUSCULAR | Status: AC
  Administered 2012-07-20: 60 mg via INTRAMUSCULAR

## 2012-07-20 MED ORDER — PROMETHAZINE HCL 12.5 MG PO TABS: 12.5000 mg | ORAL_TABLET | Freq: Four times a day (QID) | ORAL | Status: DC | PRN

## 2012-07-20 NOTE — Addendum Note (Signed)
Addended by: Jimmy Footman K on: 07/20/2012 11:20 AM   Modules accepted: Orders

## 2012-07-20 NOTE — Progress Notes (Signed)
  Subjective:    Jessica [name] is a 51 y.o. female who presents for evaluation of headache. Symptoms began about 3 days ago. Generally, the headaches last about 1 day and occur once per month. The headaches do not seem to be related to any time of the day. The headaches are usually pounding and throbbing and are located in occipita lobe.  The patient rates her most severe headaches a 9 on a scale from 1 to 10. Recently, the headaches have been increasing in severity. Work attendance or other daily activities are affected by the headaches. Precipitating factors include: stress. The headaches are usually not preceded by an aura. Associated neurologic symptoms: worsening school/work performance and nausea/vomiting, and photophobia. The patient denies dizziness, loss of balance, numbness of extremities, speech difficulties and vision problems. Home treatment has included Topamax and Aleve with no improvement. Other history includes: migraine headaches diagnosed in the past.   Review of Systems Pertinent items are noted in HPI.  Denies any unilateral weakness, fever, chills, night sweats, or unsteady gait.   Objective:     General appearance: alert, cooperative and mild distress Eyes: conjunctivae/corneas clear. PERRL, EOM's intact. Fundi benign.  Neuro: no focal deficits, 5/5 strength in all extremities; sensation intact  Assessment:   Migraine Headache   Plan:    Please see Problem List

## 2012-07-20 NOTE — Assessment & Plan Note (Signed)
Recurrent migraines, about one per month.  Topamax does not seem to be helping. Will give Toradol 60 IM today and Rx for Phenergan 12.5 to take at home to help with nausea and help her sleep. Advised patient to schedule follow up appointment with new PCP in one month. May consider changing BP medication to Propanolol BID for both HTN and migraine prophylaxis. Red flags reviewed.

## 2012-07-20 NOTE — Progress Notes (Signed)
Patient in for BP check. BP checked manually using thigh cuff. BP LA 134/86 and RA 140/90 pulse 76. Reports headache for 3 days .  Taking topamax as directed, not helping . Also took ibuprofen last night. Appointment scheduled for work in appointment.

## 2012-07-20 NOTE — Patient Instructions (Addendum)
We gave you a shot of pain medication in the office that typically lasts 6-8 hours. Please pick up Phenergan and take one tablet at home and try to get some sleep. If you develop worsening pain, vomiting, or you feel like you are going to pass out, please go to ED. Schedule follow up appointment with new PCP in 1 month.  Recurrent Migraine Headache You have a recurrent migraine headache. The caregiver can usually provide good relief for this headache. If this headache is the same as your previous migraine headaches, it is safe to treat you without repeating a complete evaluation.  These headaches usually have at least two of the following problems:   They occur on one side of the head, pulsate, and are severe enough to prevent daily activities.   They are aggravated by daily physical activities.  You may have one or more of the following symptoms:   Nausea (feeling sick to your stomach).   Vomiting.   Pain with exposure to bright lights or loud noises.  Most headache sufferers have a family history of migraines. Your headaches may also be related to alcohol and smoking habits. Too much sleep, too little sleep, mood, and anxiety may also play a part. Changing some of these triggers may help you lower the number and level of pain of the headaches. Headaches may be related to menses (female menstruation). There are numerous medications that can prevent these headaches. Your caregiver can help you with a medication or regimen (procedure to follow). If this has been a chronic (long-term) condition, the use of long-term narcotics is not recommended. Using long-term narcotics can cause recurrent migraines. Narcotics are only a temporary measure only. They are used for the infrequent migraine that fails to respond to all other measures. SEEK MEDICAL CARE IF:   You do not get relief from the medications given to you.   You have a recurrence of pain.   This headache begins to differ from past  migraine (for example if it is more severe).  SEEK IMMEDIATE MEDICAL CARE IF:  You have a fever.   You have a stiff neck.   You have vision loss or have changes in vision.   You have problems with feeling lightheaded, become faint, or lose your balance.   You have muscular weakness.   You have loss of muscular control.   You develop severe symptoms different from your first symptoms.   You start losing your balance or have trouble walking.   You feel faint or pass out.  MAKE SURE YOU:   Understand these instructions.   Will watch your condition.   Will get help right away if you are not doing well or get worse.  Document Released: 09/09/2001 Document Revised: 12/04/2011 Document Reviewed: 08/03/2008 [facility] Patient Information 2012 [place], [place].

## 2012-07-30 ENCOUNTER — Ambulatory Visit (INDEPENDENT_AMBULATORY_CARE_PROVIDER_SITE_OTHER): Payer: Medicaid Other | Admitting: Family Medicine

## 2012-07-30 ENCOUNTER — Encounter: Payer: Self-pay | Admitting: Family Medicine

## 2012-07-30 VITALS — BP 153/87 | HR 85 | Temp 98.6°F | Ht 61.0 in | Wt 291.0 lb

## 2012-07-30 DIAGNOSIS — H538 Other visual disturbances: Secondary | ICD-10-CM

## 2012-07-30 DIAGNOSIS — G43909 Migraine, unspecified, not intractable, without status migrainosus: Secondary | ICD-10-CM

## 2012-07-30 DIAGNOSIS — I10 Essential (primary) hypertension: Secondary | ICD-10-CM

## 2012-07-30 DIAGNOSIS — G4440 Drug-induced headache, not elsewhere classified, not intractable: Secondary | ICD-10-CM

## 2012-07-30 MED ORDER — TOPIRAMATE 50 MG PO TABS: 50.0000 mg | ORAL_TABLET | Freq: Two times a day (BID) | ORAL | Status: DC

## 2012-07-30 NOTE — Patient Instructions (Addendum)
Dear Arlyn Leak,   It was great to meet you today. Thank you for coming to clinic. Please read below regarding the issues that we discussed.   1. I believe you are having medication overuse/rebound headaches. I want you to trial not taking tylenol/ibuprofen/excedrin for 2 weeks.  2. For your migraines since you are still having them monthly, we are going to go up on your Topamax.  3. Your vision testing was ok today but I still think it's a good idea for you to see your eye doctor as planned.   Please follow up in clinic in 2-3 weeks to discuss your headaches and follow up your blood pressure when you are hopefully not in pain. Please call earlier if you have any questions or concerns.   Sincerely,  Dr. Tana Conch  My 5 to Fitness!  5: fruits and vegetables per day (work on 9 per day if you are at 5) 4: exercise 4-5 times per week for at least 30 minutes (walking counts!) 3: meals per day (don't skip breakfast!) 2: habits to quit -smoking -excess alcohol use (men >2 beer/day; women >1beer/day) 1: sweet per day (2 cookies, 1 small cup of ice cream, 12 oz soda)  These are general tips for healthy living. Try to start with 1 or 2 habit TODAY and make it a part of your life for several months. You set a goal today to work on: Continuing to exercise, not skipping breakfast  Once you have 1 or 2 habits down for several months, try to begin working on your next healthy habit. With every single step you take, you will be leading a healthier lifestyle!

## 2012-07-31 DIAGNOSIS — H538 Other visual disturbances: Secondary | ICD-10-CM

## 2012-07-31 DIAGNOSIS — G4440 Drug-induced headache, not elsewhere classified, not intractable: Secondary | ICD-10-CM | POA: Insufficient documentation

## 2012-07-31 HISTORY — DX: Other visual disturbances: H53.8

## 2012-07-31 NOTE — Assessment & Plan Note (Signed)
Patient compliant with HCTZ and amlodipine. Wants to try lifestyle modifications (see AVS plan) to lose weight. Could also consider salt restriction diet. If not decreasing or still with migraines, consider metoprolol or propranolol for dual therapy.

## 2012-07-31 NOTE — Progress Notes (Signed)
Subjective:   1. Migraine Headaches- had a migraine 2 weeks ago which she was treated with toradol for in office and it resolved. At baseline on Topamax and amlodipine for prevention. HA recurred 3-4 days later and lasted for 1 day.   2. Other Headache-patient descriebs hedaches outside of her migraines that start in bilateral occiput and move to bilateral front of her head. 8/10 at pain. Has been taking tylenol regularly up to 4x per day. Headaches seems to come on when medication wearing off and then once takes tylenol again will go away in 20-30 minutes (up to 4x per day). Not associated with photophobia like her migraines.   3. Blurry vision-for 2 months she describes some blurry vision. Says it pretty much goes away if she rubs her eyes most of the time but is unable to duplicat ethat here today. For me, denies being able to read writing 5 feet away but then had visual acuity testing 20/20 in bilateral eyes, 20/50 right, 20/25 left. Plans to go to eye doctor within the month.   4. Hypertension-on amlodipine only. Previous readings at goal. Compliant with amlodipine.  BP Readings from Last 3 Encounters:  07/30/12 153/87  07/20/12 134/86  07/20/12 134/86   Compliant with medications-yes without side effects Denies any CP, HA, SOB, worsening LE edema, transient weakness, orthopnea, PND.    ROS--See HPI  Past Medical History-depression, OSA, obesity Reviewed problem list.  Medications- reviewed and updated Chief complaint-noted  Objective:  Gen: NAD, resting comfortably in chair HEENT: NCAT, MMM Eye: PERRLA, fundus without papilledema CV: RRR no mrg Lungs: CTAB Abd: soft/nontender/normal bowel sounds Neuro: CN II-XII intact, 5/5 muscle strength bilateral upper and lower extremities, normal sensation and reflexes, normal finger to nose, normal rapid alternating movements, normal gait.  Skin: no rash, warm and dry  Assessment/Plan: See problem oriented charted

## 2012-07-31 NOTE — Assessment & Plan Note (Signed)
Patient with continued migraines despite prophylaxis with amlodipine and topamax. Gave patient option of going up on Topamax or starting beta blocker and she prefers to go up to max dose topamax for migraines at 100mg /day. Beta blocker would aid with blood pressure but patient wants to try some lifestyle modifications-see HTN.

## 2012-07-31 NOTE — Assessment & Plan Note (Signed)
Appears to be having rebound headaches every time tylenol wears off (patient states these are different from her migraines as not debilitating to degree of migraines). Will trial 2 weeks without tylenol. Patient appears to buy into plan especially with idea of going up on Topamax for migraine headaches

## 2012-07-31 NOTE — Assessment & Plan Note (Signed)
No papilledema, do not believe increased intracranial pressure. DO not believe imagining warranted for headaches given this. Will have patient follow up with eye doctor. Visual acuity testing reassuring as well.

## 2012-08-01 ENCOUNTER — Other Ambulatory Visit: Payer: Self-pay | Admitting: Family Medicine

## 2012-08-11 ENCOUNTER — Other Ambulatory Visit: Payer: Self-pay | Admitting: Family Medicine

## 2012-08-13 ENCOUNTER — Encounter: Payer: Self-pay | Admitting: Family Medicine

## 2012-08-13 ENCOUNTER — Encounter (HOSPITAL_COMMUNITY): Payer: Self-pay | Admitting: *Deleted

## 2012-08-13 ENCOUNTER — Ambulatory Visit (INDEPENDENT_AMBULATORY_CARE_PROVIDER_SITE_OTHER): Payer: Medicaid Other | Admitting: Family Medicine

## 2012-08-13 ENCOUNTER — Emergency Department (HOSPITAL_COMMUNITY)
Admission: EM | Admit: 2012-08-13 | Discharge: 2012-08-13 | Disposition: A | Discharge status: Home / Self Care | Payer: Medicaid Other | Attending: Emergency Medicine | Admitting: Emergency Medicine

## 2012-08-13 VITALS — BP 134/88 | HR 86 | Ht 61.0 in | Wt 291.0 lb

## 2012-08-13 DIAGNOSIS — M25519 Pain in unspecified shoulder: Secondary | ICD-10-CM | POA: Insufficient documentation

## 2012-08-13 DIAGNOSIS — G4700 Insomnia, unspecified: Secondary | ICD-10-CM | POA: Insufficient documentation

## 2012-08-13 DIAGNOSIS — M19079 Primary osteoarthritis, unspecified ankle and foot: Secondary | ICD-10-CM

## 2012-08-13 DIAGNOSIS — Z888 Allergy status to other drugs, medicaments and biological substances status: Secondary | ICD-10-CM | POA: Insufficient documentation

## 2012-08-13 DIAGNOSIS — F3289 Other specified depressive episodes: Secondary | ICD-10-CM | POA: Insufficient documentation

## 2012-08-13 DIAGNOSIS — Z91040 Latex allergy status: Secondary | ICD-10-CM | POA: Insufficient documentation

## 2012-08-13 DIAGNOSIS — Z79899 Other long term (current) drug therapy: Secondary | ICD-10-CM | POA: Insufficient documentation

## 2012-08-13 DIAGNOSIS — M25569 Pain in unspecified knee: Secondary | ICD-10-CM | POA: Diagnosis not present

## 2012-08-13 DIAGNOSIS — G8929 Other chronic pain: Secondary | ICD-10-CM

## 2012-08-13 DIAGNOSIS — I10 Essential (primary) hypertension: Secondary | ICD-10-CM | POA: Insufficient documentation

## 2012-08-13 DIAGNOSIS — F329 Major depressive disorder, single episode, unspecified: Secondary | ICD-10-CM | POA: Insufficient documentation

## 2012-08-13 MED ORDER — HYDROCODONE-ACETAMINOPHEN 5-325 MG PO TABS
1.0000 | ORAL_TABLET | Freq: Once | ORAL | Status: AC
  Administered 2012-08-13: 1 via ORAL
  Filled 2012-08-13: qty 1

## 2012-08-13 MED ORDER — TRAMADOL HCL 50 MG PO TABS: ORAL_TABLET | ORAL | Status: DC

## 2012-08-13 MED ORDER — HYDROCODONE-ACETAMINOPHEN 5-325 MG PO TABS: 1.0000 | ORAL_TABLET | Freq: Four times a day (QID) | ORAL | Status: AC | PRN

## 2012-08-13 NOTE — ED Notes (Signed)
Prescription given with discharge instructions.  

## 2012-08-13 NOTE — Patient Instructions (Addendum)
You have been scheduled for an appointment with Dr. Lajoyce Corners at [facility] on 08/27/12 at 10:45am, please arrive at 10:30am. [address]  Their number is [phone] Please bring your insurance card and picture ID

## 2012-08-13 NOTE — ED Provider Notes (Signed)
History     CSN: [id number]  Arrival date & time 08/13/12  2150   None     Chief Complaint  Patient presents with  . Shoulder Pain    (Consider location/radiation/quality/duration/timing/severity/associated sxs/prior treatment) HPI Comments: Patient has chronic right shoulder pain.  She's had steroid injections several times, last being approximately one year ago.  She states she woke this morning with pain.  That is not covered by her Ultram tablets.  She did not call her physician today.  She is followed at family practice, although she was seen by an orthopedist.  Today, because of her leg pain, but she did not mention this to the orthopedic, Dr. while she was there.  He denies any new injury, fall, over, extending herself  The history is provided by the patient.    Past Medical History  Diagnosis Date  . Hypertension   . Obesity   . HSV (herpes simplex virus) infection   . Headache   . Depression      on meds  . BV (bacterial vaginosis)     recent dx - finish med 12/22/11  . Insomnia     on meds  . Sleep apnea     occas. uses CPAP  . Anal fissure     history  . Arthritis     feet    Past Surgical History  Procedure Date  . Neck surgery 2001    cervical fusion C4/5  . Cesarean section     x 3  . Tubal ligation 1992  . Cholecystectomy   . Endometrial ablation     Family History  Problem Relation Age of Onset  . Diabetes Mother   . Diabetes Father   . Diabetes Sister   . Cancer Paternal Aunt     cevical  . Cancer Paternal Aunt     cervical    History  Substance Use Topics  . Smoking status: Never Smoker   . Smokeless tobacco: Never Used  . Alcohol Use: Yes     socially    OB History    Grav Para Term Preterm Abortions TAB SAB Ect Mult Living                  Review of Systems  Constitutional: Negative for fever and chills.  Musculoskeletal: Negative for joint swelling.  Skin: Negative for rash and wound.  Neurological: Negative for  dizziness and weakness.    Allergies  Ace inhibitors; Angiotensin receptor blockers; Lisinopril; and Latex  Home Medications   Current Outpatient Rx  Name Route Sig Dispense Refill  . AMLODIPINE BESYLATE 5 MG PO TABS Oral Take 1 tablet (5 mg total) by mouth daily. 90 tablet 3  . BUPROPION HCL ER (XL) 300 MG PO TB24 Oral Take 1 tablet (300 mg total) by mouth daily. 90 tablet 2  . CYCLOBENZAPRINE HCL 10 MG PO TABS Oral Take 10 mg by mouth 3 (three) times daily as needed. For muscle spasms.    Marland Kitchen FLUTICASONE PROPIONATE 50 MCG/ACT NA SUSP Nasal Place 2 sprays into the nose daily as needed. For congestion. 16 g 1  . HYDROCHLOROTHIAZIDE 25 MG PO TABS  TAKE 1 TABLET (25 MG TOTAL) BY MOUTH DAILY. 30 tablet 11  . TOPIRAMATE 50 MG PO TABS Oral Take 1 tablet (50 mg total) by mouth 2 (two) times daily. 60 tablet 3  . TRAMADOL HCL 50 MG PO TABS  Take 2 tablets twice a day for arthritis pain 120 tablet 2  .  VALACYCLOVIR HCL 500 MG PO TABS Oral Take 500 mg by mouth daily.    Marland Kitchen ZOLPIDEM TARTRATE 5 MG PO TABS Oral Take 5 mg by mouth at bedtime as needed. For insomnia.    Marland Kitchen HYDROCODONE-ACETAMINOPHEN 5-325 MG PO TABS Oral Take 1 tablet by mouth every 6 (six) hours as needed for pain. 15 tablet 0    BP 145/92  Temp 98.7 F (37.1 C) (Oral)  Resp 18  SpO2 97%  Physical Exam  Constitutional: She appears well-developed and well-nourished.       Morbidly obese  HENT:  Head: Normocephalic.  Eyes: Pupils are equal, round, and reactive to light.  Neck: Normal range of motion.  Cardiovascular: Normal rate.   Musculoskeletal: She exhibits tenderness. She exhibits no edema.       Restricted range of motion.  Right shoulder in several planes of movement  Neurological: She is alert.  Skin: Skin is warm.    ED Course  Procedures (including critical care time)  Labs Reviewed - No data to display No results found.   1. Chronic pain in right shoulder       MDM  Exacerbation of chronic  pain        Arman Filter, NP 08/13/12 2247  Arman Filter, NP 08/13/12 2247

## 2012-08-13 NOTE — ED Notes (Signed)
The pt has had chronic rt shoulder pain since 1997.  Today she woke up with the pain

## 2012-08-14 NOTE — ED Provider Notes (Signed)
Medical screening examination/treatment/procedure(s) were performed by non-physician practitioner and as supervising physician I was immediately available for consultation/collaboration.  Derwood Kaplan, MD 08/14/12 [phone]

## 2012-08-15 NOTE — Progress Notes (Signed)
  Subjective:    Patient ID: Jessica [name], female    DOB: 07-29-1961, 51 y.o.   MRN: [id number]  HPI  Both right knee and right great toe continue to hurt. The injections did not help more than a few days. She would like to see ortho for eval other options, esp her toe. She thinks it is making her walk really funny and tus causing her more right knee problems. She is aware weight is a big issue for her---feels frustrated that she cannot walk much for exercise brcause of tehse two issues.  Knee pain is 6-8/10 most of the day and bothers her at night as well. Toe pain mostly when she is walking but cannot walk around her house to do adls secondary to this.  Review of Systems Pertinent review of systems: negative for fever or unusual weight change.      Objective:   Physical Exam  Vital signs reviewed. GENERAL: Well developed, well nourished, no acute distress. Morbid obesity.  TOE: right great---IP joint TTP. Some rigidity at ip and at MTP, but not full hallux rigidus. No erythema or warmth. KNEE_ medial joint line tenderness. FROm in extension and flexion. Ligamentously intact.mcMurray painful but no pop. Popliteal space is benign. Distally NV intact to soft touch sensation and DP pulses 2+ B=.  Imaging reviewed with her. Reports: Bilateral standing AP: June 2013:  Findings: All standing AP views of the knee are submitted. With  the patient standing, there is joint space narrowing of the medial  compartments bilaterally, right greater than left. No evidence of  osteophyte formation. No acute bony abnormality.  IMPRESSION:  Medial compartment joint space narrowing is evident with the  patient standing. Joint space narrowing is more prominent in the  right knee than left.   RIGHT knee:  Findings: The knee is aligned. No evidence of effusion or  fracture. Joint spaces are maintained. Negative for osteophytes.  Obesity.  IMPRESSION:  No acute bony abnormality or significant  degenerative change.  Stable compared to 04/17/2012      Assessment & Plan:

## 2012-08-15 NOTE — Assessment & Plan Note (Signed)
CSI injection did not improve pain. She desires Ortho consult and we will schedule

## 2012-08-15 NOTE — Assessment & Plan Note (Signed)
OA right knee---medial joint line particularly painful. CSI has not improved pain. She would like further eval by ortho and we have set that up rtc prnnee

## 2012-08-27 ENCOUNTER — Other Ambulatory Visit: Payer: Self-pay | Admitting: Orthopedic Surgery

## 2012-08-27 DIAGNOSIS — M25561 Pain in right knee: Secondary | ICD-10-CM

## 2012-09-04 ENCOUNTER — Ambulatory Visit
Admission: RE | Admit: 2012-09-04 | Discharge: 2012-09-04 | Disposition: A | Discharge status: Home / Self Care | Payer: Medicaid Other | Source: Ambulatory Visit | Attending: Orthopedic Surgery | Admitting: Orthopedic Surgery

## 2012-09-04 DIAGNOSIS — M25561 Pain in right knee: Secondary | ICD-10-CM

## 2012-09-08 ENCOUNTER — Encounter: Payer: Self-pay | Admitting: Sports Medicine

## 2012-09-08 ENCOUNTER — Ambulatory Visit (INDEPENDENT_AMBULATORY_CARE_PROVIDER_SITE_OTHER): Payer: Medicaid Other | Admitting: Sports Medicine

## 2012-09-08 VITALS — BP 135/86 | HR 88 | Ht 61.0 in | Wt 280.3 lb

## 2012-09-08 DIAGNOSIS — L819 Disorder of pigmentation, unspecified: Secondary | ICD-10-CM

## 2012-09-08 DIAGNOSIS — I10 Essential (primary) hypertension: Secondary | ICD-10-CM

## 2012-09-08 DIAGNOSIS — Z202 Contact with and (suspected) exposure to infections with a predominantly sexual mode of transmission: Secondary | ICD-10-CM | POA: Insufficient documentation

## 2012-09-08 DIAGNOSIS — M25569 Pain in unspecified knee: Secondary | ICD-10-CM

## 2012-09-08 DIAGNOSIS — Z9189 Other specified personal risk factors, not elsewhere classified: Secondary | ICD-10-CM

## 2012-09-08 DIAGNOSIS — R7309 Other abnormal glucose: Secondary | ICD-10-CM

## 2012-09-08 DIAGNOSIS — R7303 Prediabetes: Secondary | ICD-10-CM

## 2012-09-08 LAB — POCT GLYCOSYLATED HEMOGLOBIN (HGB A1C): Hemoglobin A1C: 5.7

## 2012-09-08 NOTE — Assessment & Plan Note (Addendum)
Follow up with Dr. Lajoyce Corners to tomorrow.

## 2012-09-08 NOTE — Patient Instructions (Addendum)
It was nice to meet you today. Please pickup Selsun blue and applied to your skin for 10 minutes prior showering each day for the next 3 days.  This will help take care of any kind of fungal infection that is causing this discoloration.  Be sure to use a skin moisturize her with some screen at daily.  He will take some time for this discoloration to resolve.    Will check labs today.  If anything is abnormal we will help you scheduling sooner followup.  We'll send you a letter with your results   Please plan to return in 6 months to further discuss your blood pressure in weight loss.  If you would like 1 sooner than that please feel free to schedule one sooner.    Here are some basic nutrition rules to remember:  "Eat Real Foods & Drink Real Drinks" - if you think it was made in a factory . . it is likely best to avoid it as a staple in your diet.  Limiting these types of foods to 1-2 times per week is a good idea.  Sticking with fresh fruits and vegetables as well as home cooked meals will typically provide more nutrition and less salt than prepackaged meals.     Limit the amount of sugar sweetened and artificially sweetened foods and beverages.  Sticking with water flavored with a slice of lemon, lime or orange is a great option if you want something with flavor in it.  Using flavored seltzer water to flavor plain water will also add some bite if you want something more than flavor.     Here are 2 of my favorite web sites that provide great nutrition and exercise advice.   www.eatsmartmovemore[place].com www.choosemyplate.gov

## 2012-09-08 NOTE — Assessment & Plan Note (Signed)
Improved control.  No medication changes today.  Patient self-monitoring home.  Follow up in 6 months.

## 2012-09-08 NOTE — Assessment & Plan Note (Signed)
Diet information sheet given today

## 2012-09-08 NOTE — Assessment & Plan Note (Signed)
Check HIV RPR today.  Last HIV was June of 2012 and was negative.  No new exposures since that time however this was immediately following

## 2012-09-08 NOTE — Assessment & Plan Note (Signed)
Check A1c today.

## 2012-09-09 ENCOUNTER — Encounter: Payer: Self-pay | Admitting: Sports Medicine

## 2012-09-10 ENCOUNTER — Telehealth: Payer: Self-pay | Admitting: Sports Medicine

## 2012-09-10 NOTE — Telephone Encounter (Signed)
Called patient and informed her of normal results.Busick, [name]

## 2012-09-10 NOTE — Telephone Encounter (Signed)
Patient is calling for her lab results from Wednesday.

## 2012-09-16 DIAGNOSIS — L819 Disorder of pigmentation, unspecified: Secondary | ICD-10-CM | POA: Insufficient documentation

## 2012-09-16 NOTE — Assessment & Plan Note (Addendum)
Likely postinflammatory hypopigmentation secondary to her sunburn however given the distribution I am concerned that this may due to the tinea versicolor infection.  Given her instructions for obtaining Selsun Blue (Selinum Sulfide) OTC to be applied topically 10 minutes prior to showering.  Did discuss the hypopigmentation will take some time to resolve but this will hopefully treat any underlying fungal component and decreased likelihood is continuing to occur.
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
## 2012-09-25 ENCOUNTER — Encounter (HOSPITAL_COMMUNITY): Payer: Self-pay | Admitting: Emergency Medicine

## 2012-09-25 ENCOUNTER — Emergency Department (HOSPITAL_COMMUNITY)
Admission: EM | Admit: 2012-09-25 | Discharge: 2012-09-25 | Disposition: A | Discharge status: Home / Self Care | Payer: Medicaid Other | Attending: Emergency Medicine | Admitting: Emergency Medicine

## 2012-09-25 DIAGNOSIS — I10 Essential (primary) hypertension: Secondary | ICD-10-CM | POA: Insufficient documentation

## 2012-09-25 DIAGNOSIS — M129 Arthropathy, unspecified: Secondary | ICD-10-CM | POA: Insufficient documentation

## 2012-09-25 DIAGNOSIS — J019 Acute sinusitis, unspecified: Secondary | ICD-10-CM

## 2012-09-25 DIAGNOSIS — E669 Obesity, unspecified: Secondary | ICD-10-CM | POA: Insufficient documentation

## 2012-09-25 DIAGNOSIS — G4700 Insomnia, unspecified: Secondary | ICD-10-CM | POA: Insufficient documentation

## 2012-09-25 DIAGNOSIS — R51 Headache: Secondary | ICD-10-CM | POA: Insufficient documentation

## 2012-09-25 HISTORY — DX: Migraine, unspecified, not intractable, without status migrainosus: G43.909

## 2012-09-25 MED ORDER — AZITHROMYCIN 250 MG PO TABS: 250.0000 mg | ORAL_TABLET | Freq: Every day | ORAL | Status: DC

## 2012-09-25 MED ORDER — FLUCONAZOLE 150 MG PO TABS: 150.0000 mg | ORAL_TABLET | Freq: Once | ORAL | Status: DC

## 2012-09-25 NOTE — ED Provider Notes (Signed)
History     CSN: [id number]  Arrival date & time 09/25/12  4132   First MD Initiated Contact with Patient 09/25/12 2127      Chief Complaint  Patient presents with  . Headache  . Otalgia    (Consider location/radiation/quality/duration/timing/severity/associated sxs/prior treatment) HPI Comments: Patient comes in today with a chief complaint of headache.  She states that the pain is located over the frontal sinuses and she describes it as a pressure.  She reports that she had similar symptoms last week that resolved and then returned two days ago.  Headache has been constant over the past two days.  She is also having some pressure in both of her ears and some nasal congestion.  She has a history of migraine headaches, but states that this headache is different than her migraine headaches.  She has not taken anything for her symptoms.    Patient is a 51 y.o. female presenting with headaches. The history is provided by the patient.  Headache  This is a recurrent problem. Pertinent negatives include no fever, no near-syncope, no syncope and no nausea.    Past Medical History  Diagnosis Date  . Hypertension   . Obesity   . HSV (herpes simplex virus) infection   . Headache   . Depression      on meds  . BV (bacterial vaginosis)     recent dx - finish med 12/22/11  . Insomnia     on meds  . Sleep apnea     occas. uses CPAP  . Anal fissure     history  . Arthritis     feet  . Migraines     Past Surgical History  Procedure Date  . Neck surgery 2001    cervical fusion C4/5  . Cesarean section     x 3  . Tubal ligation 1992  . Cholecystectomy   . Endometrial ablation     Family History  Problem Relation Age of Onset  . Diabetes Mother   . Diabetes Father   . Diabetes Sister   . Cancer Paternal Aunt     cevical  . Cancer Paternal Aunt     cervical    History  Substance Use Topics  . Smoking status: Never Smoker   . Smokeless tobacco: Never Used  . Alcohol  Use: Yes     socially    OB History    Grav Para Term Preterm Abortions TAB SAB Ect Mult Living                  Review of Systems  Constitutional: Negative for fever and chills.  HENT: Positive for sinus pressure. Negative for trouble swallowing.   Eyes: Negative for photophobia and visual disturbance.  Cardiovascular: Negative for syncope and near-syncope.  Gastrointestinal: Negative for nausea.  Neurological: Negative for dizziness, syncope and light-headedness.    Allergies  Ace inhibitors; Angiotensin receptor blockers; Lisinopril; and Latex  Home Medications   Current Outpatient Rx  Name Route Sig Dispense Refill  . AMLODIPINE BESYLATE 5 MG PO TABS Oral Take 1 tablet (5 mg total) by mouth daily. 90 tablet 3  . BUPROPION HCL ER (XL) 300 MG PO TB24 Oral Take 1 tablet (300 mg total) by mouth daily. 90 tablet 2  . CYCLOBENZAPRINE HCL 10 MG PO TABS Oral Take 10 mg by mouth 3 (three) times daily as needed. For muscle spasms.    Marland Kitchen FLUTICASONE PROPIONATE 50 MCG/ACT NA SUSP Nasal Place 2 sprays  into the nose daily as needed. For congestion. 16 g 1  . HYDROCHLOROTHIAZIDE 25 MG PO TABS  TAKE 1 TABLET (25 MG TOTAL) BY MOUTH DAILY. 30 tablet 11  . IBUPROFEN 600 MG PO TABS Oral Take 600 mg by mouth every 6 (six) hours as needed. Pain.    . TOPIRAMATE 50 MG PO TABS Oral Take 1 tablet (50 mg total) by mouth 2 (two) times daily. 60 tablet 3  . TRAMADOL HCL 50 MG PO TABS  Take 2 tablets twice a day for arthritis pain 120 tablet 2  . VALACYCLOVIR HCL 500 MG PO TABS Oral Take 500 mg by mouth daily.    Marland Kitchen ZOLPIDEM TARTRATE 5 MG PO TABS Oral Take 5 mg by mouth at bedtime as needed. For insomnia.      BP 134/84  Pulse 89  Temp 99.1 F (37.3 C) (Oral)  Resp 16  Ht 5\' 1"  (1.549 m)  Wt 280 lb (127.007 kg)  BMI 52.91 kg/m2  SpO2 100%  Physical Exam  Nursing note and vitals reviewed. Constitutional: She appears well-developed and well-nourished. No distress.  HENT:  Head: Normocephalic  and atraumatic.  Right Ear: Tympanic membrane and ear canal normal.  Left Ear: Tympanic membrane and ear canal normal.  Nose: Mucosal edema present. Right sinus exhibits frontal sinus tenderness. Right sinus exhibits no maxillary sinus tenderness. Left sinus exhibits frontal sinus tenderness. Left sinus exhibits no maxillary sinus tenderness.  Mouth/Throat: Uvula is midline, oropharynx is clear and moist and mucous membranes are normal.  Cardiovascular: Normal rate, regular rhythm and normal heart sounds.   Pulmonary/Chest: Effort normal and breath sounds normal.  Neurological: She is alert. She has normal strength. No cranial nerve deficit or sensory deficit. Coordination and gait normal.  Skin: Skin is warm and dry. She is not diaphoretic.  Psychiatric: She has a normal mood and affect.    ED Course  Procedures (including critical care time)  Labs Reviewed - No data to display No results found.   No diagnosis found.    MDM  Signs and symptoms consistent with Acute Sinusitis.  Patient given prescription for antibiotic therapy.  Patient discharged home.  Return precautions discussed.        Pascal [place], PA-C 09/27/12 1146

## 2012-09-25 NOTE — ED Notes (Signed)
Pt c/o pain to around bilat ears, frontal HA. Pt c/o ringing in L ear intermittent x 1 week. Pt does have migraines, states this does not feel like migraine. A & O, no neuro deficits noted, PWD

## 2012-09-25 NOTE — ED Notes (Signed)
Pt c/o frontal headache x 1 week and pressure and pain to both ears; denies congestion or cold symptoms; denies drainage from ear

## 2012-09-28 HISTORY — PX: KNEE ARTHROSCOPY: SUR90

## 2012-09-28 NOTE — Progress Notes (Signed)
This encounter was created in error - please disregard.

## 2012-09-28 NOTE — ED Provider Notes (Signed)
Medical screening examination/treatment/procedure(s) were performed by non-physician practitioner and as supervising physician I was immediately available for consultation/collaboration.    Dhilan Brauer L Deangelo Berns, MD 09/28/12 1947 

## 2012-10-04 ENCOUNTER — Other Ambulatory Visit: Payer: Self-pay | Admitting: Obstetrics & Gynecology

## 2012-10-04 DIAGNOSIS — Z1231 Encounter for screening mammogram for malignant neoplasm of breast: Secondary | ICD-10-CM

## 2012-10-08 DIAGNOSIS — M659 Synovitis and tenosynovitis, unspecified: Secondary | ICD-10-CM | POA: Diagnosis not present

## 2012-10-08 DIAGNOSIS — M23329 Other meniscus derangements, posterior horn of medial meniscus, unspecified knee: Secondary | ICD-10-CM | POA: Diagnosis not present

## 2012-10-08 DIAGNOSIS — M6750 Plica syndrome, unspecified knee: Secondary | ICD-10-CM | POA: Diagnosis not present

## 2012-10-08 DIAGNOSIS — M23359 Other meniscus derangements, posterior horn of lateral meniscus, unspecified knee: Secondary | ICD-10-CM | POA: Diagnosis not present

## 2012-10-08 DIAGNOSIS — M959 Acquired deformity of musculoskeletal system, unspecified: Secondary | ICD-10-CM | POA: Diagnosis not present

## 2012-11-12 ENCOUNTER — Ambulatory Visit
Admission: RE | Admit: 2012-11-12 | Discharge: 2012-11-12 | Disposition: A | Discharge status: Home / Self Care | Payer: Medicaid Other | Source: Ambulatory Visit | Attending: Obstetrics & Gynecology | Admitting: Obstetrics & Gynecology

## 2012-11-12 DIAGNOSIS — Z1231 Encounter for screening mammogram for malignant neoplasm of breast: Secondary | ICD-10-CM

## 2012-12-07 ENCOUNTER — Other Ambulatory Visit (HOSPITAL_COMMUNITY)
Admission: RE | Admit: 2012-12-07 | Discharge: 2012-12-07 | Disposition: A | Discharge status: Home / Self Care | Payer: Medicaid Other | Source: Ambulatory Visit | Attending: Obstetrics & Gynecology | Admitting: Obstetrics & Gynecology

## 2012-12-07 ENCOUNTER — Encounter: Payer: Self-pay | Admitting: Obstetrics & Gynecology

## 2012-12-07 ENCOUNTER — Ambulatory Visit (INDEPENDENT_AMBULATORY_CARE_PROVIDER_SITE_OTHER): Payer: Medicaid Other | Admitting: Obstetrics & Gynecology

## 2012-12-07 VITALS — BP 111/82 | HR 78 | Ht 61.0 in | Wt 280.0 lb

## 2012-12-07 DIAGNOSIS — Z01419 Encounter for gynecological examination (general) (routine) without abnormal findings: Secondary | ICD-10-CM | POA: Insufficient documentation

## 2012-12-07 DIAGNOSIS — Z1151 Encounter for screening for human papillomavirus (HPV): Secondary | ICD-10-CM | POA: Insufficient documentation

## 2012-12-07 DIAGNOSIS — N9089 Other specified noninflammatory disorders of vulva and perineum: Secondary | ICD-10-CM

## 2012-12-07 DIAGNOSIS — Z23 Encounter for immunization: Secondary | ICD-10-CM

## 2012-12-07 MED ORDER — INFLUENZA VIRUS VACC SPLIT PF IM SUSP: 0.5000 mL | Freq: Once | INTRAMUSCULAR | Status: DC

## 2012-12-07 NOTE — Progress Notes (Signed)
  Subjective:     Jessica [name] is a 51 y.o. G8P3003 female and is here for a comprehensive physical exam. The patient reports having vulvar irritation after shaving her pubic area and applying some powder to the area. No discharge or bleeding. Denies other GYN concerns. Not currently sexually active. Last pap on 10/27/2011 was ASCUS +HRHPV, colposcopy showed CIN I.  She had a mammogram on 11/15/12 which was normal.  History   Social History  . Marital Status: Divorced    Spouse Name: N/A    Number of Children: N/A  . Years of Education: N/A   Occupational History  . Not on file.   Social History Main Topics  . Smoking status: Never Smoker   . Smokeless tobacco: Never Used  . Alcohol Use: Yes     Comment: socially  . Drug Use: No  . Sexually Active: Not on file     Comment: tubaligation   Other Topics Concern  . Not on file   Social History Narrative   December 14, 2012 patient recently lost her second job. She  just got custody of her 42 year old niece. She is concerned about finances.03/22/12: Niece is living with her.  Niece is doing better, but still struggling in school.   Health Maintenance  Topic Date Due  . Influenza Vaccine  08/29/2012  . Pap Smear  10/26/2014  . Tetanus/tdap  12/07/2019   The following portions of the patient's history were reviewed and updated as appropriate: allergies, current medications, past family history, past medical history, past social history, past surgical history and problem list.  Review of Systems Pertinent items are noted in HPI.   Objective:   BP 111/82  Pulse 78  Ht 5\' 1"  (1.549 m)  Wt 280 lb (127.007 kg)  BMI 52.91 kg/m2 GENERAL: Well-developed, well-nourished obese female in no acute distress.  HEENT: Normocephalic, atraumatic. Sclerae anicteric.  NECK: Supple. Normal thyroid.  LUNGS: Clear to auscultation bilaterally.  HEART: Regular rate and rhythm. BREASTS: Symmetric in size and pendulous. No masses, skin changes,  nipple drainage, or lymphadenopathy. ABDOMEN: Soft, obese, nontender, nondistended. No organomegaly. PELVIC: Normal external female genitalia with some excoriated surface noted on mons pubis. No erythema, no induration or drainage. Vagina is pink and rugated. Normal discharge. Normal cervix contour. Pap smear obtained. Unable to palpate uterus or adnexa secondary to habitus.  EXTREMITIES: No cyanosis, clubbing, or edema, 2+ distal pulses.   Assessment:    Healthy female exam. Vulvar irritation     Plan:    Pap done, will follow up results and manage accordingly. Recommended not shaving but trimming pubic hair and also not applying powder to the area.  She was advised to ger OTC hydrocortisone cream to apply to the area; call back if symptoms worsen Routine preventative health maintenance measures emphasized. Flu shot given today.

## 2012-12-07 NOTE — Patient Instructions (Signed)
Preventive Care for Adults, Female A healthy lifestyle and preventive care can promote health and wellness. Preventive health guidelines for women include the following key practices.  A routine yearly physical is a good way to check with your caregiver about your health and preventive screening. It is a chance to share any concerns and updates on your health, and to receive a thorough exam.  Visit your dentist for a routine exam and preventive care every 6 months. Brush your teeth twice a day and floss once a day. Good oral hygiene prevents tooth decay and gum disease.  The frequency of eye exams is based on your age, health, family medical history, use of contact lenses, and other factors. Follow your caregiver's recommendations for frequency of eye exams.  Eat a healthy diet. Foods like vegetables, fruits, whole grains, low-fat dairy products, and lean protein foods contain the nutrients you need without too many calories. Decrease your intake of foods high in solid fats, added sugars, and salt. Eat the right amount of calories for you.Get information about a proper diet from your caregiver, if necessary.  Regular physical exercise is one of the most important things you can do for your health. Most adults should get at least 150 minutes of moderate-intensity exercise (any activity that increases your heart rate and causes you to sweat) each week. In addition, most adults need muscle-strengthening exercises on 2 or more days a week.  Maintain a healthy weight. The body mass index (BMI) is a screening tool to identify possible weight problems. It provides an estimate of body fat based on height and weight. Your caregiver can help determine your BMI, and can help you achieve or maintain a healthy weight.For adults 20 years and older:  A BMI below 18.5 is considered underweight.  A BMI of 18.5 to 24.9 is normal.  A BMI of 25 to 29.9 is considered overweight.  A BMI of 30 and above is  considered obese.  Maintain normal blood lipids and cholesterol levels by exercising and minimizing your intake of saturated fat. Eat a balanced diet with plenty of fruit and vegetables. Blood tests for lipids and cholesterol should begin at age 41 and be repeated every 5 years. If your lipid or cholesterol levels are high, you are over 50, or you are at high risk for heart disease, you may need your cholesterol levels checked more frequently.Ongoing high lipid and cholesterol levels should be treated with medicines if diet and exercise are not effective.  If you smoke, find out from your caregiver how to quit. If you do not use tobacco, do not start.  If you are pregnant, do not drink alcohol. If you are breastfeeding, be very cautious about drinking alcohol. If you are not pregnant and choose to drink alcohol, do not exceed 1 drink per day. One drink is considered to be 12 ounces (355 mL) of beer, 5 ounces (148 mL) of wine, or 1.5 ounces (44 mL) of liquor.  Avoid use of street drugs. Do not share needles with anyone. Ask for help if you need support or instructions about stopping the use of drugs.  High blood pressure causes heart disease and increases the risk of stroke. Your blood pressure should be checked at least every 1 to 2 years. Ongoing high blood pressure should be treated with medicines if weight loss and exercise are not effective.  If you are 65 to 49 years old, ask your caregiver if you should take aspirin to prevent strokes.  Diabetes  screening involves taking a blood sample to check your fasting blood sugar level. This should be done once every 3 years, after age 45, if you are within normal weight and without risk factors for diabetes. Testing should be considered at a younger age or be carried out more frequently if you are overweight and have at least 1 risk factor for diabetes.  Breast cancer screening is essential preventive care for women. You should practice "breast  self-awareness." This means understanding the normal appearance and feel of your breasts and may include breast self-examination. Any changes detected, no matter how small, should be reported to a caregiver. Women in their 20s and 30s should have a clinical breast exam (CBE) by a caregiver as part of a regular health exam every 1 to 3 years. After age 40, women should have a CBE every year. Starting at age 40, women should consider having a mammography (breast X-ray test) every year. Women who have a family history of breast cancer should talk to their caregiver about genetic screening. Women at a high risk of breast cancer should talk to their caregivers about having magnetic resonance imaging (MRI) and a mammography every year.  The Pap test is a screening test for cervical cancer. A Pap test can show cell changes on the cervix that might become cervical cancer if left untreated. A Pap test is a procedure in which cells are obtained and examined from the lower end of the uterus (cervix).  Women should have a Pap test starting at age 21.  Between ages 21 and 29, Pap tests should be repeated every 2 years.  Beginning at age 30, you should have a Pap test every 3 years as long as the past 3 Pap tests have been normal.  Some women have medical problems that increase the chance of getting cervical cancer. Talk to your caregiver about these problems. It is especially important to talk to your caregiver if a new problem develops soon after your last Pap test. In these cases, your caregiver may recommend more frequent screening and Pap tests.  The above recommendations are the same for women who have or have not gotten the vaccine for human papillomavirus (HPV).  If you had a hysterectomy for a problem that was not cancer or a condition that could lead to cancer, then you no longer need Pap tests. Even if you no longer need a Pap test, a regular exam is a good idea to make sure no other problems are  starting.  If you are between ages 65 and 70, and you have had normal Pap tests going back 10 years, you no longer need Pap tests. Even if you no longer need a Pap test, a regular exam is a good idea to make sure no other problems are starting.  If you have had past treatment for cervical cancer or a condition that could lead to cancer, you need Pap tests and screening for cancer for at least 20 years after your treatment.  If Pap tests have been discontinued, risk factors (such as a new sexual partner) need to be reassessed to determine if screening should be resumed.  The HPV test is an additional test that may be used for cervical cancer screening. The HPV test looks for the virus that can cause the cell changes on the cervix. The cells collected during the Pap test can be tested for HPV. The HPV test could be used to screen women aged 30 years and older, and should   be used in women of any age who have unclear Pap test results. After the age of 30, women should have HPV testing at the same frequency as a Pap test.  Colorectal cancer can be detected and often prevented. Most routine colorectal cancer screening begins at the age of 50 and continues through age 75. However, your caregiver may recommend screening at an earlier age if you have risk factors for colon cancer. On a yearly basis, your caregiver may provide home test kits to check for hidden blood in the stool. Use of a small camera at the end of a tube, to directly examine the colon (sigmoidoscopy or colonoscopy), can detect the earliest forms of colorectal cancer. Talk to your caregiver about this at age 50, when routine screening begins. Direct examination of the colon should be repeated every 5 to 10 years through age 75, unless early forms of pre-cancerous polyps or small growths are found.  Hepatitis C blood testing is recommended for all people born from 1945 through 1965 and any individual with known risks for hepatitis C.  Practice  safe sex. Use condoms and avoid high-risk sexual practices to reduce the spread of sexually transmitted infections (STIs). STIs include gonorrhea, chlamydia, syphilis, trichomonas, herpes, HPV, and human immunodeficiency virus (HIV). Herpes, HIV, and HPV are viral illnesses that have no cure. They can result in disability, cancer, and death. Sexually active women aged 25 and younger should be checked for chlamydia. Older women with new or multiple partners should also be tested for chlamydia. Testing for other STIs is recommended if you are sexually active and at increased risk.  Osteoporosis is a disease in which the bones lose minerals and strength with aging. This can result in serious bone fractures. The risk of osteoporosis can be identified using a bone density scan. Women ages 65 and over and women at risk for fractures or osteoporosis should discuss screening with their caregivers. Ask your caregiver whether you should take a calcium supplement or vitamin D to reduce the rate of osteoporosis.  Menopause can be associated with physical symptoms and risks. Hormone replacement therapy is available to decrease symptoms and risks. You should talk to your caregiver about whether hormone replacement therapy is right for you.  Use sunscreen with sun protection factor (SPF) of 30 or more. Apply sunscreen liberally and repeatedly throughout the day. You should seek shade when your shadow is shorter than you. Protect yourself by wearing long sleeves, pants, a wide-brimmed hat, and sunglasses year round, whenever you are outdoors.  Once a month, do a whole body skin exam, using a mirror to look at the skin on your back. Notify your caregiver of new moles, moles that have irregular borders, moles that are larger than a pencil eraser, or moles that have changed in shape or color.  Stay current with required immunizations.  Influenza. You need a dose every fall (or winter). The composition of the flu vaccine  changes each year, so being vaccinated once is not enough.  Pneumococcal polysaccharide. You need 1 to 2 doses if you smoke cigarettes or if you have certain chronic medical conditions. You need 1 dose at age 65 (or older) if you have never been vaccinated.  Tetanus, diphtheria, pertussis (Tdap, Td). Get 1 dose of Tdap vaccine if you are younger than age 65, are over 65 and have contact with an infant, are a healthcare worker, are pregnant, or simply want to be protected from whooping cough. After that, you need a Td   booster dose every 10 years. Consult your caregiver if you have not had at least 3 tetanus and diphtheria-containing shots sometime in your life or have a deep or dirty wound.  HPV. You need this vaccine if you are a woman age 26 or younger. The vaccine is given in 3 doses over 6 months.  Measles, mumps, rubella (MMR). You need at least 1 dose of MMR if you were born in 1957 or later. You may also need a second dose.  Meningococcal. If you are age 19 to 21 and a first-year college student living in a residence hall, or have one of several medical conditions, you need to get vaccinated against meningococcal disease. You may also need additional booster doses.  Zoster (shingles). If you are age 60 or older, you should get this vaccine.  Varicella (chickenpox). If you have never had chickenpox or you were vaccinated but received only 1 dose, talk to your caregiver to find out if you need this vaccine.  Hepatitis A. You need this vaccine if you have a specific risk factor for hepatitis A virus infection or you simply wish to be protected from this disease. The vaccine is usually given as 2 doses, 6 to 18 months apart.  Hepatitis B. You need this vaccine if you have a specific risk factor for hepatitis B virus infection or you simply wish to be protected from this disease. The vaccine is given in 3 doses, usually over 6 months. Preventive Services / Frequency Ages 19 to 39  Blood  pressure check.** / Every 1 to 2 years.  Lipid and cholesterol check.** / Every 5 years beginning at age 20.  Clinical breast exam.** / Every 3 years for women in their 20s and 30s.  Pap test.** / Every 2 years from ages 21 through 29. Every 3 years starting at age 30 through age 65 or 70 with a history of 3 consecutive normal Pap tests.  HPV screening.** / Every 3 years from ages 30 through ages 65 to 70 with a history of 3 consecutive normal Pap tests.  Hepatitis C blood test.** / For any individual with known risks for hepatitis C.  Skin self-exam. / Monthly.  Influenza immunization.** / Every year.  Pneumococcal polysaccharide immunization.** / 1 to 2 doses if you smoke cigarettes or if you have certain chronic medical conditions.  Tetanus, diphtheria, pertussis (Tdap, Td) immunization. / A one-time dose of Tdap vaccine. After that, you need a Td booster dose every 10 years.  HPV immunization. / 3 doses over 6 months, if you are 26 and younger.  Measles, mumps, rubella (MMR) immunization. / You need at least 1 dose of MMR if you were born in 1957 or later. You may also need a second dose.  Meningococcal immunization. / 1 dose if you are age 19 to 21 and a first-year college student living in a residence hall, or have one of several medical conditions, you need to get vaccinated against meningococcal disease. You may also need additional booster doses.  Varicella immunization.** / Consult your caregiver.  Hepatitis A immunization.** / Consult your caregiver. 2 doses, 6 to 18 months apart.  Hepatitis B immunization.** / Consult your caregiver. 3 doses usually over 6 months. Ages 40 to 64  Blood pressure check.** / Every 1 to 2 years.  Lipid and cholesterol check.** / Every 5 years beginning at age 20.  Clinical breast exam.** / Every year after age 40.  Mammogram.** / Every year beginning at age 40   and continuing for as long as you are in good health. Consult with your  caregiver.  Pap test.** / Every 3 years starting at age 30 through age 65 or 70 with a history of 3 consecutive normal Pap tests.  HPV screening.** / Every 3 years from ages 30 through ages 65 to 70 with a history of 3 consecutive normal Pap tests.  Fecal occult blood test (FOBT) of stool. / Every year beginning at age 50 and continuing until age 75. You may not need to do this test if you get a colonoscopy every 10 years.  Flexible sigmoidoscopy or colonoscopy.** / Every 5 years for a flexible sigmoidoscopy or every 10 years for a colonoscopy beginning at age 50 and continuing until age 75.  Hepatitis C blood test.** / For all people born from 1945 through 1965 and any individual with known risks for hepatitis C.  Skin self-exam. / Monthly.  Influenza immunization.** / Every year.  Pneumococcal polysaccharide immunization.** / 1 to 2 doses if you smoke cigarettes or if you have certain chronic medical conditions.  Tetanus, diphtheria, pertussis (Tdap, Td) immunization.** / A one-time dose of Tdap vaccine. After that, you need a Td booster dose every 10 years.  Measles, mumps, rubella (MMR) immunization. / You need at least 1 dose of MMR if you were born in 1957 or later. You may also need a second dose.  Varicella immunization.** / Consult your caregiver.  Meningococcal immunization.** / Consult your caregiver.  Hepatitis A immunization.** / Consult your caregiver. 2 doses, 6 to 18 months apart.  Hepatitis B immunization.** / Consult your caregiver. 3 doses, usually over 6 months. Ages 65 and over  Blood pressure check.** / Every 1 to 2 years.  Lipid and cholesterol check.** / Every 5 years beginning at age 20.  Clinical breast exam.** / Every year after age 40.  Mammogram.** / Every year beginning at age 40 and continuing for as long as you are in good health. Consult with your caregiver.  Pap test.** / Every 3 years starting at age 30 through age 65 or 70 with a 3  consecutive normal Pap tests. Testing can be stopped between 65 and 70 with 3 consecutive normal Pap tests and no abnormal Pap or HPV tests in the past 10 years.  HPV screening.** / Every 3 years from ages 30 through ages 65 or 70 with a history of 3 consecutive normal Pap tests. Testing can be stopped between 65 and 70 with 3 consecutive normal Pap tests and no abnormal Pap or HPV tests in the past 10 years.  Fecal occult blood test (FOBT) of stool. / Every year beginning at age 50 and continuing until age 75. You may not need to do this test if you get a colonoscopy every 10 years.  Flexible sigmoidoscopy or colonoscopy.** / Every 5 years for a flexible sigmoidoscopy or every 10 years for a colonoscopy beginning at age 50 and continuing until age 75.  Hepatitis C blood test.** / For all people born from 1945 through 1965 and any individual with known risks for hepatitis C.  Osteoporosis screening.** / A one-time screening for women ages 65 and over and women at risk for fractures or osteoporosis.  Skin self-exam. / Monthly.  Influenza immunization.** / Every year.  Pneumococcal polysaccharide immunization.** / 1 dose at age 65 (or older) if you have never been vaccinated.  Tetanus, diphtheria, pertussis (Tdap, Td) immunization. / A one-time dose of Tdap vaccine if you are over   65 and have contact with an infant, are a Research scientist (physical sciences), or simply want to be protected from whooping cough. After that, you need a Td booster dose every 10 years.  Varicella immunization.** / Consult your caregiver.  Meningococcal immunization.** / Consult your caregiver.  Hepatitis A immunization.** / Consult your caregiver. 2 doses, 6 to 18 months apart.  Hepatitis B immunization.** / Check with your caregiver. 3 doses, usually over 6 months. ** Family history and personal history of risk and conditions may change your caregiver's recommendations. Document Released: 02/10/2002 Document Revised: 03/08/2012  Document Reviewed: 05/12/2011 [facility] Patient Information 2013 [place], [place].   Thank you for enrolling in MyChart. Please follow the instructions below to securely access your online medical record. MyChart allows you to send messages to your doctor, view your test results, manage appointments, and more.   How Do I Sign Up? 1. In your Internet browser, go to [employer] and enter [email or web address]. 2. Click on the Sign Up Now link in the Sign In box. You will see the New Member Sign Up page. 3. Enter your MyChart Access Code exactly as it appears below. You will not need to use this code after you've completed the sign-up process. If you do not sign up before the expiration date, you must request a new code. MyChart Access Code: [email or web address] Expires: 01/06/2013  2:00 PM  4. Enter your Social Security Number ([id number]) and Date of Birth (mm/dd/yyyy) as indicated and click Submit. You will be taken to the next sign-up page. 5. Create a MyChart ID. This will be your MyChart login ID and cannot be changed, so think of one that is secure and easy to remember. 6. Create a MyChart password. You can change your password at any time. 7. Enter your Password Reset Question and Answer. This can be used at a later time if you forget your password.  8. Enter your e-mail address. You will receive e-mail notification when new information is available in MyChart. 9. Click Sign Up. You can now view your medical record.   Additional Information Remember, MyChart is NOT to be used for urgent needs. For medical emergencies, dial 911.

## 2012-12-30 ENCOUNTER — Telehealth: Payer: Self-pay | Admitting: *Deleted

## 2012-12-30 NOTE — Telephone Encounter (Signed)
Patient scheduled for surgery on 01/11/13.  Need NPI #.  Returned call and left message to call our office back.  Gaylene Brooks, RN

## 2012-12-30 NOTE — Telephone Encounter (Signed)
Patient is having left great toe cheilectomy performed.  Due to patient having Medicaid, office calling to request NPI #  to authorize appt.  NPI # given.  Gaylene Brooks, RN

## 2013-01-08 ENCOUNTER — Other Ambulatory Visit: Payer: Self-pay | Admitting: Family Medicine

## 2013-01-11 DIAGNOSIS — M2020 Hallux rigidus, unspecified foot: Secondary | ICD-10-CM | POA: Diagnosis not present

## 2013-01-17 ENCOUNTER — Telehealth: Payer: Self-pay | Admitting: *Deleted

## 2013-01-17 MED ORDER — FLUCONAZOLE 150 MG PO TABS: ORAL_TABLET | ORAL | Status: DC

## 2013-01-17 NOTE — Telephone Encounter (Signed)
Patient had surgery last week and feels like she has a yeast infection due to the antibiotics that she was given during surgery.  Has an itchy white discharge.

## 2013-01-17 NOTE — Telephone Encounter (Signed)
Electronic rx did not go through so I called in a prescription for Diflucan and spoke directly to the pharmacist at 1:14.

## 2013-01-18 ENCOUNTER — Ambulatory Visit (INDEPENDENT_AMBULATORY_CARE_PROVIDER_SITE_OTHER): Payer: Medicaid Other | Admitting: Family Medicine

## 2013-01-18 ENCOUNTER — Encounter: Payer: Self-pay | Admitting: Family Medicine

## 2013-01-18 VITALS — BP 155/80 | HR 82 | Temp 98.8°F | Ht 61.0 in | Wt 283.7 lb

## 2013-01-18 DIAGNOSIS — R600 Localized edema: Secondary | ICD-10-CM

## 2013-01-18 DIAGNOSIS — I872 Venous insufficiency (chronic) (peripheral): Secondary | ICD-10-CM

## 2013-01-18 DIAGNOSIS — I10 Essential (primary) hypertension: Secondary | ICD-10-CM

## 2013-01-18 DIAGNOSIS — R609 Edema, unspecified: Secondary | ICD-10-CM

## 2013-01-18 DIAGNOSIS — G500 Trigeminal neuralgia: Secondary | ICD-10-CM | POA: Insufficient documentation

## 2013-01-18 HISTORY — DX: Trigeminal neuralgia: G50.0

## 2013-01-18 LAB — CBC
MCV: 88.7 fL (ref 78.0–100.0)
Platelets: 362 10*3/uL (ref 150–400)
RBC: 4.17 MIL/uL (ref 3.87–5.11)
WBC: 7.4 10*3/uL (ref 4.0–10.5)

## 2013-01-18 LAB — COMPREHENSIVE METABOLIC PANEL
CO2: 27 mEq/L (ref 19–32)
Glucose, Bld: 83 mg/dL (ref 70–99)
Sodium: 139 mEq/L (ref 135–145)
Total Bilirubin: 0.3 mg/dL (ref 0.3–1.2)
Total Protein: 7.8 g/dL (ref 6.0–8.3)

## 2013-01-18 MED ORDER — CARBAMAZEPINE ER 100 MG PO CP12: 100.0000 mg | ORAL_CAPSULE | Freq: Two times a day (BID) | ORAL | Status: DC

## 2013-01-18 NOTE — Patient Instructions (Addendum)
We are doing some lab tests today to look at your leg swelling.  I will let you know the results.  If everything looks good, the next step will be figuring out whether the swelling is from your knee or your blood pressure medicine.   For your head, it is called trigeminal neuralgia.  The medicine for this is called Carbamezapine.    Take 1 in the AM and 1 in the PM. If we need to increase the dose we will do this gradually.    Check in with [place] in 2-3 weeks to see how you're doing.   Trigeminal Neuralgia Trigeminal neuralgia is a nerve disorder that causes sudden attacks of severe facial pain. It is caused by damage to the trigeminal nerve, a major nerve in the face. It is more common in women and in the elderly, although it can also happen in younger patients. Attacks last from a few seconds to several minutes and can occur from a couple of times per year to several times per day. Trigeminal neuralgia can be a very distressing and disabling condition. Surgery may be needed in very severe cases if medical treatment does not give relief. HOME CARE INSTRUCTIONS   If your caregiver prescribed medication to help prevent attacks, take as directed.  To help prevent attacks:  Chew on the unaffected side of the mouth.  Avoid touching your face.  Avoid blasts of hot or cold air.  Men may wish to grow a beard to avoid having to shave. SEEK IMMEDIATE MEDICAL CARE IF:  Pain is unbearable and your medicine does not help.  You develop new, unexplained symptoms (problems).  You have problems that may be related to a medication you are taking. Document Released: 12/12/2000 Document Revised: 03/08/2012 Document Reviewed: 10/12/2009 [facility] Patient Information 2013 [place], [place].

## 2013-01-18 NOTE — Progress Notes (Signed)
  Subjective:    Patient ID: Jessica [name], female    DOB: 03-07-1961, 52 y.o.   MRN: [id number]  HPI  Left sided facial pain:  Present for past 4-6 weeks.  Describes sharp, stabbing pain that starts around her ear, radiates to Left parietal region and down into jaw.  No pain upper face, cheeks.  No changes in vision.  Has not tried anything for relief, has taken Tramadol since surgery for her foot and unsure if this has helped.  Was on Topamax for migraines in past, feels this might have been causing pain, stopped and pain has improved but is still present.  Notes "lightning" type quality of pain, present most days of the week.  No associated headaches, no neck pain   Right leg swelling: Present for past week.  Has actually occurred intermittently for past year on further investigation.  Pt had RLE doppler scheduled Dec 2012 which she never completed.  Has used compression stockings on Right leg before but doesn't wear them now because states they are too painful.  Had knee surgery in October 2013.  Didn't note change in swelling at that time.  Had surgery on bunion Left foot about 1 week ago and noted swelling in foot and ankle since that time.  No redness, no pain/tenderness.    Is on Amlodipine, states she has been on this for several months but that leg swelling predates Norvasc use.      Review of Systems See HPI above for review of systems.       Objective:   Physical Exam BP 155/80  Pulse 82  Temp 98.8 F (37.1 C) (Oral)  Ht 5\' 1"  (1.549 m)  Wt 283 lb 11.2 oz (128.685 kg)  BMI 53.60 kg/m2 Gen: Well NAD HEENT: [place]/AT.  PERRL.  EOMI,  Nasal turbinates clear and nares patent BL.  TMs with normal canal and minimal cerumen BL.  BL TM's pearly gray with good light reflex.  MMM without tonsillar hypertrophy.  Nontender along Left mandible, Left TMJ joint, and Left pinna/mastoid process/pre-auricular area.   Lungs: CTABL Nl WOB Heart: RRR no MRG Exts: Left leg obese.  Trace edema  noted.  Wearing surgical shoe.  Right Leg obese.  +1 edema ankle and dorsum of foot noted.  No erythema.  No warmth.  Homan's negative.  Nontender.        Assessment & Plan:

## 2013-01-18 NOTE — Assessment & Plan Note (Signed)
Classic symptoms V3 distribution. Printed picture from [employer] for her showing nerve distribution to mandible, ear, and parietal region.  Also provided Epic handout on this. Started Carbamazepine 100 mg BID.  May need to increase dosage.  FU in 2-3 weeks for recheck, sooner if worsening.

## 2013-01-18 NOTE — Assessment & Plan Note (Signed)
Believe this is contributing to Right leg edema. Did not see this in her problem list at first and discussed possibility of increased edema from surgery vs Norvasc usage, but then noted she had order for LE venous doppler of Right leg from Dec 2012 which she never had completed.  At that point she informed me this had been going on for some time. Recommended to re-try compression stockings.  Also discussed that HCTZ is a weak diuretic which might help some with getting the fluid off.  Continue elevation as she has been doing.

## 2013-01-19 ENCOUNTER — Encounter: Payer: Self-pay | Admitting: Family Medicine

## 2013-01-20 ENCOUNTER — Encounter: Payer: Self-pay | Admitting: Sports Medicine

## 2013-01-20 ENCOUNTER — Ambulatory Visit (INDEPENDENT_AMBULATORY_CARE_PROVIDER_SITE_OTHER): Payer: Medicaid Other | Admitting: Sports Medicine

## 2013-01-20 ENCOUNTER — Other Ambulatory Visit (HOSPITAL_COMMUNITY)
Admission: RE | Admit: 2013-01-20 | Discharge: 2013-01-20 | Disposition: A | Discharge status: Home / Self Care | Payer: Medicaid Other | Source: Ambulatory Visit | Attending: Family Medicine | Admitting: Family Medicine

## 2013-01-20 VITALS — BP 150/87 | HR 86 | Temp 98.3°F | Ht 61.0 in | Wt 283.0 lb

## 2013-01-20 DIAGNOSIS — R109 Unspecified abdominal pain: Secondary | ICD-10-CM | POA: Insufficient documentation

## 2013-01-20 DIAGNOSIS — G500 Trigeminal neuralgia: Secondary | ICD-10-CM

## 2013-01-20 DIAGNOSIS — Z9189 Other specified personal risk factors, not elsewhere classified: Secondary | ICD-10-CM

## 2013-01-20 DIAGNOSIS — Z202 Contact with and (suspected) exposure to infections with a predominantly sexual mode of transmission: Secondary | ICD-10-CM

## 2013-01-20 DIAGNOSIS — N760 Acute vaginitis: Secondary | ICD-10-CM | POA: Insufficient documentation

## 2013-01-20 DIAGNOSIS — Z113 Encounter for screening for infections with a predominantly sexual mode of transmission: Secondary | ICD-10-CM | POA: Insufficient documentation

## 2013-01-20 DIAGNOSIS — G43909 Migraine, unspecified, not intractable, without status migrainosus: Secondary | ICD-10-CM

## 2013-01-20 DIAGNOSIS — G4733 Obstructive sleep apnea (adult) (pediatric): Secondary | ICD-10-CM

## 2013-01-20 LAB — POCT URINALYSIS DIPSTICK
Bilirubin, UA: NEGATIVE
Blood, UA: NEGATIVE
Glucose, UA: NEGATIVE
Leukocytes, UA: NEGATIVE
Nitrite, UA: NEGATIVE

## 2013-01-20 MED ORDER — OXCARBAZEPINE 150 MG PO TABS: 150.0000 mg | ORAL_TABLET | Freq: Two times a day (BID) | ORAL | Status: DC

## 2013-01-20 NOTE — Assessment & Plan Note (Signed)
Recently had condom break,  No dysuria, itching, frequency or dischage,; Some abdomino/pelvic pain as above.  Will check urine gc/chlamydia/ trich

## 2013-01-20 NOTE — Assessment & Plan Note (Signed)
Change to OxCarbamazepine as Carbamazepine can cause GI problems Abd sx coordinate with starting meds >F/u in Feb appt scheduled.  May need higher dosing

## 2013-01-20 NOTE — Patient Instructions (Addendum)
It was nice to see you today.   Today we discussed: 1. Abdominal pain  I have changed your meds as below, I will call you with results tomorrow  2. Trigeminal neuralgia  - OXcarbazepine (TRILEPTAL) 150 MG tablet; Take 1 tablet (150 mg total) by mouth 2 (two) times daily.  Dispense: 60 tablet; Refill: 0  3. MIGRAINE NOS W/O INTRACTABLE MIGRAINE  It is okay not to take your Topamax for now, we may need to restart this  4. SLEEP APNEA, OBSTRUCTIVE  Follow up with [facility]   Please plan to return to see me as scheduled.  If you need anything prior to seeing me please call the clinic.  Please Bring all medications with you to each appointment.

## 2013-01-20 NOTE — Assessment & Plan Note (Signed)
Issues with fit, will call med supplier >consider repeat Sleep study and new machine

## 2013-01-20 NOTE — Assessment & Plan Note (Signed)
Started at same time as starting Carbamazepine.  Will change to OxCarbamazepine Will check urine for GC and UTI.

## 2013-01-20 NOTE — Assessment & Plan Note (Signed)
Has stopped taking topamax Continue to follow

## 2013-01-23 NOTE — Progress Notes (Signed)
  [facility]  Patient name: Jessica [name] MRN [id number]  Date of birth: [birth year]  CC & HPI:  Jessica [name] is a 52 y.o. female presenting today for the following reasons:  # concern of STD exposure:  Had intercourse where condom broke.  No symptoms but concerned.  No other high risk behaviors.  S/p ablation  # F/u Face pain:  Largely unchaged  # New abdominal pain: started 2 days ago.  Described as occasional sharp shooting abdominopelvic pain.  No fevers, no chills.  No change in Bowel/bladder.  No dysuria/frequency.  Started following starting Carbamazepime.  No vomiting but some nausea  # Hx of Headaches: stopped topamax  # Sleep apnea: previously dx. Having issues with CPAP  ROS:  Per HPI  Pertinent History Reviewed:  Medical & Surgical Hx:  Reviewed: Significant for OSA, allergic rhinitis, pre-diabetes; HTN; migraines Medications: Reviewed & Updated - see associated section Social History: Reviewed -  reports that she has never smoked. She has never used smokeless tobacco.   Objective Findings:  Vitals:  Filed Vitals:   01/20/13 1409  BP: 150/87  Pulse: 86  Temp: 98.3 F (36.8 C)    PE: GENERAL:  Adult obese AA female. In no discomfort; no respiratory distress. PSYCH: good insight, slightly anxious Abdominal: obese, soft, no guarding    Assessment & Plan:

## 2013-02-07 ENCOUNTER — Ambulatory Visit: Payer: Medicaid Other | Admitting: Sports Medicine

## 2013-02-17 ENCOUNTER — Ambulatory Visit (INDEPENDENT_AMBULATORY_CARE_PROVIDER_SITE_OTHER): Payer: Medicaid Other | Admitting: Sports Medicine

## 2013-02-17 ENCOUNTER — Encounter: Payer: Self-pay | Admitting: Sports Medicine

## 2013-02-17 VITALS — BP 131/71 | HR 84 | Temp 98.9°F | Ht 61.0 in | Wt 284.0 lb

## 2013-02-17 DIAGNOSIS — I10 Essential (primary) hypertension: Secondary | ICD-10-CM

## 2013-02-17 DIAGNOSIS — M25569 Pain in unspecified knee: Secondary | ICD-10-CM

## 2013-02-17 NOTE — Progress Notes (Signed)
  [facility]  Patient name: Jessica [name] MRN [id number]  Date of birth: 1961-07-28  CC & HPI:  Jessica [name] is a 52 y.o. female presenting today for follow up of:  #  Hypertension - chronic problem, well controlled.    no orthostasis  no chest pain, no dyspnea on exertion, no orthopnea/PND, no peripheral edema,   no episodes of unilateral weakness, dysarthria or acute visual changes  ------------------------------------------------------------------------------------------------------------------ Medication Compliance: compliant most of the time  Diet Compliance: compliant most of the time ------------------------------------------------------------------------------------------------------------------ New Concerns:  R Knee Pain:  Had been doing better following debridement by Dr. Lajoyce Corners but has now re-occurred >2 weeks.  Reports significant pain, some swelling initially but non currently.  No reported known injury.  Denies, locking, clicking or giving way.   ROS:  PER HPI  Pertinent History Reviewed:  Medical & Surgical Hx:  Reviewed: Significant for recent R knee arthroscopy with debridement, HTN, obesity, OSA Medications: Reviewed & Updated - See associated section in EMR Social History: Reviewed -  reports that she has never smoked. She has never used smokeless tobacco.   Objective Findings:  Vitals: BP 131/71  Pulse 84  Temp(Src) 98.9 F (37.2 C) (Oral)  Ht 5\' 1"  (1.549 m)  Wt 284 lb (128.822 kg)  BMI 53.69 kg/m2  PE: GENERAL:  Adult obese AA  female. In no discomfort; no respiratory distress. PSYCH: Alert and appropriately interactive; Insight:Good  H&N: AT/[place], trachea midline EENT:  MMM, no scleral icterus, EOMi HEART: RRR, S1/S2 heard, no murmur LUNGS: CTA B, no wheezes, no crackles EXTREMITIES: Moves all 4 extremities spontaneously, warm well perfused, no edema,  MSK: prior Knee X-ray's reviewed no significant joint space disease but unclear if  weight bearing, R KNEE: full ROM, TTP over medial jointline, no effusion, negative patellar grind,  negative mcmurray's, + [place], neg med/lateral stress, neg a/p drawer  Assessment & Plan:   R Knee Intra-articular Injection Performed today:  The risks, benefits, and expected outcomes of the injection were reviewed and she wishes to undergo the above named procedure.  After an appropriate time out was taken, the R Knee was prepped in a clean fashion and injected from a inferior Lateral, bent knee approach with a 21g syringe using 3cc of 1% plain Lidocaine and 1mg  of DepoMedrol 80mg /mL. There was only minimal resistance with injection.  A bandaid was applied to the area.  This procedure was well tolerated and there were no complications.    Discussed after care with patient

## 2013-02-20 NOTE — Assessment & Plan Note (Signed)
Knee injection today. If not improved or only short term relief will have follow up with Dr. 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

## 2013-02-20 NOTE — Assessment & Plan Note (Signed)
Stable/Well Controlled - no changes at this time. 

## 2013-03-12 ENCOUNTER — Encounter (HOSPITAL_COMMUNITY): Payer: Self-pay | Admitting: Emergency Medicine

## 2013-03-12 ENCOUNTER — Emergency Department (HOSPITAL_COMMUNITY)
Admission: EM | Admit: 2013-03-12 | Discharge: 2013-03-12 | Disposition: A | Discharge status: Home / Self Care | Payer: Medicaid Other | Attending: Emergency Medicine | Admitting: Emergency Medicine

## 2013-03-12 DIAGNOSIS — Z9889 Other specified postprocedural states: Secondary | ICD-10-CM | POA: Insufficient documentation

## 2013-03-12 DIAGNOSIS — M25569 Pain in unspecified knee: Secondary | ICD-10-CM | POA: Insufficient documentation

## 2013-03-12 DIAGNOSIS — Z8742 Personal history of other diseases of the female genital tract: Secondary | ICD-10-CM | POA: Insufficient documentation

## 2013-03-12 DIAGNOSIS — G4700 Insomnia, unspecified: Secondary | ICD-10-CM | POA: Insufficient documentation

## 2013-03-12 DIAGNOSIS — G43909 Migraine, unspecified, not intractable, without status migrainosus: Secondary | ICD-10-CM | POA: Insufficient documentation

## 2013-03-12 DIAGNOSIS — M25469 Effusion, unspecified knee: Secondary | ICD-10-CM | POA: Insufficient documentation

## 2013-03-12 DIAGNOSIS — I10 Essential (primary) hypertension: Secondary | ICD-10-CM | POA: Insufficient documentation

## 2013-03-12 DIAGNOSIS — Z8619 Personal history of other infectious and parasitic diseases: Secondary | ICD-10-CM | POA: Insufficient documentation

## 2013-03-12 DIAGNOSIS — Z8739 Personal history of other diseases of the musculoskeletal system and connective tissue: Secondary | ICD-10-CM | POA: Insufficient documentation

## 2013-03-12 DIAGNOSIS — F3289 Other specified depressive episodes: Secondary | ICD-10-CM | POA: Insufficient documentation

## 2013-03-12 DIAGNOSIS — F329 Major depressive disorder, single episode, unspecified: Secondary | ICD-10-CM | POA: Insufficient documentation

## 2013-03-12 DIAGNOSIS — E669 Obesity, unspecified: Secondary | ICD-10-CM | POA: Insufficient documentation

## 2013-03-12 DIAGNOSIS — Z79899 Other long term (current) drug therapy: Secondary | ICD-10-CM | POA: Insufficient documentation

## 2013-03-12 DIAGNOSIS — M25561 Pain in right knee: Secondary | ICD-10-CM

## 2013-03-12 DIAGNOSIS — R52 Pain, unspecified: Secondary | ICD-10-CM | POA: Insufficient documentation

## 2013-03-12 DIAGNOSIS — G4730 Sleep apnea, unspecified: Secondary | ICD-10-CM | POA: Insufficient documentation

## 2013-03-12 DIAGNOSIS — Z8719 Personal history of other diseases of the digestive system: Secondary | ICD-10-CM | POA: Insufficient documentation

## 2013-03-12 DIAGNOSIS — IMO0002 Reserved for concepts with insufficient information to code with codable children: Secondary | ICD-10-CM | POA: Insufficient documentation

## 2013-03-12 MED ORDER — HYDROCODONE-ACETAMINOPHEN 5-325 MG PO TABS
2.0000 | ORAL_TABLET | Freq: Once | ORAL | Status: AC
  Administered 2013-03-12: 2 via ORAL
  Filled 2013-03-12: qty 2

## 2013-03-12 MED ORDER — HYDROCODONE-ACETAMINOPHEN 5-325 MG PO TABS: 2.0000 | ORAL_TABLET | ORAL | Status: DC | PRN

## 2013-03-12 NOTE — ED Provider Notes (Signed)
History  This chart was scribed for non-physician practitioner Dierdre Forth, PA-C working with Vida Roller, MD, by Candelaria Stagers, ED Scribe. This patient was seen in room TR05C/TR05C and the patient's care was started at 9:58 PM   CSN: [id number]  Arrival date & time 03/12/13  2000   First MD Initiated Contact with Patient 03/12/13 2128      Chief Complaint  Patient presents with  . Knee Pain     The history is provided by the patient. No language interpreter was used.   Jessica [name] is a 52 y.o. female who presents to the Emergency Department complaining of right knee pain that started three days ago and is radiating down to her toes.  She describes the pain as a shooting pain.  She is also experiencing swelling to the right knee.  Pt has h/o surgery to this knee and states that two weeks ago she had a cortisone shot which gave some relief originally.  Pt has taken ibuprofen with no relief.  She denies fever, chills, nausea, or vomiting.  Pt is ambulatory with a limp.  She states the pain is worse when getting up from a seated position.  Pt has an appointment with orthopaedist on 03/15/13.   Past Medical History  Diagnosis Date  . Hypertension   . Obesity   . HSV (herpes simplex virus) infection   . Headache   . Depression      on meds  . BV (bacterial vaginosis)     recent dx - finish med 12/22/11  . Insomnia     on meds  . Sleep apnea     occas. uses CPAP  . Anal fissure     history  . Arthritis     feet  . Migraines     Past Surgical History  Procedure Laterality Date  . Neck surgery  2001    cervical fusion C4/5  . Cesarean section      x 3  . Tubal ligation  1992  . Cholecystectomy    . Endometrial ablation    . Knee surgery  oct 2013    minesctomy and debridement    Family History  Problem Relation Age of Onset  . Diabetes Mother   . Diabetes Father   . Diabetes Sister   . Cancer Paternal Aunt     cevical  . Cancer Paternal Aunt      cervical    History  Substance Use Topics  . Smoking status: Never Smoker   . Smokeless tobacco: Never Used  . Alcohol Use: No     Comment: socially    OB History   Grav Para Term Preterm Abortions TAB SAB Ect Mult Living   3 3 3       3       Review of Systems  Constitutional: Negative for fever and chills.  Musculoskeletal: Positive for arthralgias (right knee pain).  All other systems reviewed and are negative.    Allergies  Ace inhibitors; Angiotensin receptor blockers; Lisinopril; and Latex  Home Medications   Current Outpatient Rx  Name  Route  Sig  Dispense  Refill  . amLODipine (NORVASC) 5 MG tablet   Oral   Take 1 tablet (5 mg total) by mouth daily.   90 tablet   3   . buPROPion (WELLBUTRIN XL) 300 MG 24 hr tablet   Oral   Take 1 tablet (300 mg total) by mouth daily.   90 tablet  2   . carbamazepine (CARBATROL) 100 MG 12 hr capsule   Oral   Take 1 capsule (100 mg total) by mouth 2 (two) times daily.   60 capsule   0   . hydrochlorothiazide (HYDRODIURIL) 25 MG tablet      TAKE 1 TABLET (25 MG TOTAL) BY MOUTH DAILY.   30 tablet   11   . ibuprofen (ADVIL,MOTRIN) 800 MG tablet   Oral   Take 800 mg by mouth every 8 (eight) hours as needed for pain.         Marland Kitchen topiramate (TOPAMAX) 50 MG tablet   Oral   Take 1 tablet (50 mg total) by mouth 2 (two) times daily.   60 tablet   3   . valACYclovir (VALTREX) 500 MG tablet      TAKE 1 TABLET BY MOUTH ONCE DAILY   90 tablet   3   . cyclobenzaprine (FLEXERIL) 10 MG tablet   Oral   Take 10 mg by mouth 3 (three) times daily as needed. For muscle spasms.         . fluticasone (FLONASE) 50 MCG/ACT nasal spray   Nasal   Place 2 sprays into the nose daily as needed. For congestion.   16 g   1   . HYDROcodone-acetaminophen (NORCO/VICODIN) 5-325 MG per tablet   Oral   Take 2 tablets by mouth every 4 (four) hours as needed for pain.   16 tablet   0   . zolpidem (AMBIEN) 5 MG tablet   Oral    Take 5 mg by mouth at bedtime as needed. For insomnia.           BP 161/90  Temp(Src) 98.7 F (37.1 C) (Oral)  Resp 18  SpO2 97%  Physical Exam  Nursing note and vitals reviewed. Constitutional: She is oriented to person, place, and time. She appears well-developed and well-nourished. No distress.  HENT:  Head: Normocephalic and atraumatic.  Eyes: Conjunctivae are normal.  Neck: Normal range of motion.  Cardiovascular: Normal rate, regular rhythm, normal heart sounds and intact distal pulses.  Exam reveals no gallop and no friction rub.   No murmur heard. Pulses:      Radial pulses are 2+ on the right side, and 2+ on the left side.       Dorsalis pedis pulses are 2+ on the right side, and 2+ on the left side.       Posterior tibial pulses are 2+ on the right side, and 2+ on the left side.  Capillary refill < 3 sec  Pulmonary/Chest: Effort normal and breath sounds normal. No respiratory distress. She has no wheezes.  Musculoskeletal: She exhibits no edema.       Right knee: She exhibits normal range of motion, no swelling, no effusion, no ecchymosis, no deformity, no laceration, no erythema, normal alignment, no LCL laxity, normal patellar mobility, no bony tenderness, normal meniscus and no MCL laxity.       Legs: Full ROM.  Tenderness to anterior medial right knee.   Lymphadenopathy:    She has no cervical adenopathy.  Neurological: She is alert and oriented to person, place, and time. Coordination normal. GCS eye subscore is 4. GCS verbal subscore is 5. GCS motor subscore is 6.  Reflex Scores:      Tricep reflexes are 2+ on the right side and 2+ on the left side.      Bicep reflexes are 2+ on the right side and 2+ on  the left side.      Brachioradialis reflexes are 2+ on the right side and 2+ on the left side.      Patellar reflexes are 2+ on the right side and 2+ on the left side.      Achilles reflexes are 2+ on the right side and 2+ on the left side. Sensation normal.   Strength 5/5.    Skin: Skin is warm and dry. She is not diaphoretic. No erythema.  No tenting of the skin  Psychiatric: She has a normal mood and affect.    ED Course  Procedures   DIAGNOSTIC STUDIES: Oxygen Saturation is 97% on room air, normal by my interpretation.    COORDINATION OF CARE:  10:03 PM Discussed course of care with pt which includes pain medication and follow up with orthopaedist.  Pt understands and agrees.   Labs Reviewed - No data to display No results found.   1. Knee pain, acute, right       MDM  Enijah L [name] presents with nontraumatic knee pain. No x-ray indicated at this time. Pain managed in ED. patient is without signs of systemic illness for septic joint. She has no fever, is not tachycardic, is not hypotensive. She is nontoxic nonseptic appearing. Her joint has full range of motion, is not erythematous or warm to the touch. Patient has been evaluated thoroughly in the past for DVT with all negative studies. Patient was recently evaluated for DVT the beginning of this year with a negative study. Patient is without unilateral leg swelling and I have a low suspicion for DVT. Pt advised to follow up with orthopedics at her currently scheduled appointment.  Conservative therapy recommended and discussed. Patient will be dc home with pain control until ortho appointment & is agreeable with above plan.  I personally performed the services described in this documentation, which was scribed in my presence. The recorded information has been reviewed and is accurate.        Dahlia Client Oumou Smead, PA-C 03/12/13 2234

## 2013-03-12 NOTE — ED Provider Notes (Signed)
Medical screening examination/treatment/procedure(s) were conducted as a shared visit with non-physician practitioner(s) and myself.  I personally evaluated the patient during the encounter   Pt with R knee pain started a week after getting a cortisone shot in said knee.  Pain radiates from the knee to the lower leg and is worse with palpation, no redness, no obvoius swelling but pt has morbid obesity.  She nas no sub Q emphysema, soft compartments and has normal ROM of the knee with minimal pain.  Very supple.  Pain meds, f/u with Ortho this coming week.  Vida Roller, MD 03/12/13 [phone]

## 2013-03-12 NOTE — ED Notes (Signed)
Patient complaining of knee pain and swelling that started yesterday; patient reports periodic knee pain due to past knee surgery.  Patient reports taking 800 mg ibuprofen that has provided little to no relief.  Patient ambulatory in triage.  Denies recent injury.

## 2013-03-12 NOTE — ED Notes (Signed)
Pt denies any questions upon discharge, verbalize understanding to no driving with meds given and rx.

## 2013-03-17 DIAGNOSIS — M1710 Unilateral primary osteoarthritis, unspecified knee: Secondary | ICD-10-CM | POA: Diagnosis not present

## 2013-04-22 ENCOUNTER — Telehealth: Payer: Self-pay | Admitting: *Deleted

## 2013-04-22 DIAGNOSIS — B9689 Other specified bacterial agents as the cause of diseases classified elsewhere: Secondary | ICD-10-CM

## 2013-04-22 MED ORDER — METRONIDAZOLE 500 MG PO TABS: 500.0000 mg | ORAL_TABLET | Freq: Two times a day (BID) | ORAL | Status: DC

## 2013-04-22 NOTE — Telephone Encounter (Signed)
Patient is having increased itching and yellow lotiony discharge.  She has a history of bv and would like something called in.

## 2013-05-04 ENCOUNTER — Ambulatory Visit (INDEPENDENT_AMBULATORY_CARE_PROVIDER_SITE_OTHER): Payer: Medicare Other | Admitting: Family Medicine

## 2013-05-04 ENCOUNTER — Encounter: Payer: Self-pay | Admitting: Family Medicine

## 2013-05-04 VITALS — BP 136/82 | HR 96 | Temp 98.9°F | Ht 61.0 in | Wt 285.0 lb

## 2013-05-04 DIAGNOSIS — J309 Allergic rhinitis, unspecified: Secondary | ICD-10-CM | POA: Diagnosis not present

## 2013-05-04 MED ORDER — OLOPATADINE HCL 0.1 % OP SOLN: 1.0000 [drp] | Freq: Two times a day (BID) | OPHTHALMIC | Status: DC

## 2013-05-04 NOTE — Progress Notes (Signed)
Patient ID: Jessica [name], female   DOB: Jul 25, 1961, 52 y.o.   MRN: [id number] Subjective: The patient is a 52 y.o. year old female who presents today for allergies.  Patient reports problems with sneezing, nasal congestion, left ear pain, itching eyes, and itching nose is been going on for last several weeks. For the last week she has been using Flonase every several hours to no effect. She also try Claritin to no effect. She denies any fevers, chills, wheezing, shortness of breath, or facial pain. She has had problems with allergies in the past.  Patient's past medical, social, and family history were reviewed and updated as appropriate. History  Substance Use Topics  . Smoking status: Never Smoker   . Smokeless tobacco: Never Used  . Alcohol Use: No     Comment: socially   Objective:  Filed Vitals:   05/04/13 1530  BP: 136/82  Pulse: 96  Temp: 98.9 F (37.2 C)   Gen: No acute distress, morbidly obese HEENT: Mucous members moist, extraocular movements intact. Serous effusion behind tympanic membranes bilaterally. No erythema or discharge. No cervical adenopathy. Mild mucosal cobblestoning in the posterior pharynx but no erythema or exudates. There is no facial tenderness. There is no conjunctival injection.  Assessment/Plan:  Please also see individual problems in problem list for problem-specific plans.

## 2013-05-04 NOTE — Assessment & Plan Note (Signed)
Begin nasal saline 56 times daily. Decrease Nasonex dose to appropriate level. Patient to try several different antihistamines. Also provided ocular antihistamine for her eyes. At this point in time there is no evidence of infection there is no indication for antibiotics.

## 2013-05-04 NOTE — Patient Instructions (Signed)
Nasal saline spray 5-6 times per day. Continue nasonex but use saline spray before the nasonex. Try several of the different allergy medications (Allegra, Zyrtec, Benadryl) to find one that works better for you. I have sent in an eye drop for the itching.  Use it twice per day.

## 2013-05-05 ENCOUNTER — Other Ambulatory Visit: Payer: Self-pay | Admitting: *Deleted

## 2013-05-05 MED ORDER — FLUTICASONE PROPIONATE 50 MCG/ACT NA SUSP: 2.0000 | Freq: Every day | NASAL | Status: DC | PRN

## 2013-05-09 ENCOUNTER — Other Ambulatory Visit: Payer: Self-pay | Admitting: *Deleted

## 2013-05-09 MED ORDER — BUPROPION HCL ER (XL) 300 MG PO TB24: 300.0000 mg | ORAL_TABLET | Freq: Every day | ORAL | Status: DC

## 2013-06-01 ENCOUNTER — Other Ambulatory Visit: Payer: Self-pay | Admitting: *Deleted

## 2013-06-02 ENCOUNTER — Other Ambulatory Visit: Payer: Self-pay | Admitting: *Deleted

## 2013-06-02 MED ORDER — AMLODIPINE BESYLATE 5 MG PO TABS: 5.0000 mg | ORAL_TABLET | Freq: Every day | ORAL | Status: DC

## 2013-06-02 MED ORDER — IBUPROFEN 800 MG PO TABS: 800.0000 mg | ORAL_TABLET | Freq: Three times a day (TID) | ORAL | Status: DC | PRN

## 2013-06-16 ENCOUNTER — Emergency Department (HOSPITAL_COMMUNITY): Payer: Medicare Other

## 2013-06-16 ENCOUNTER — Encounter (HOSPITAL_COMMUNITY): Payer: Self-pay

## 2013-06-16 ENCOUNTER — Emergency Department (HOSPITAL_COMMUNITY)
Admission: EM | Admit: 2013-06-16 | Discharge: 2013-06-17 | Disposition: A | Discharge status: Home / Self Care | Payer: Medicare Other | Attending: Emergency Medicine | Admitting: Emergency Medicine

## 2013-06-16 DIAGNOSIS — M7989 Other specified soft tissue disorders: Secondary | ICD-10-CM | POA: Diagnosis not present

## 2013-06-16 DIAGNOSIS — I10 Essential (primary) hypertension: Secondary | ICD-10-CM | POA: Insufficient documentation

## 2013-06-16 DIAGNOSIS — Z79899 Other long term (current) drug therapy: Secondary | ICD-10-CM | POA: Diagnosis not present

## 2013-06-16 DIAGNOSIS — M19079 Primary osteoarthritis, unspecified ankle and foot: Secondary | ICD-10-CM | POA: Diagnosis not present

## 2013-06-16 DIAGNOSIS — Z8719 Personal history of other diseases of the digestive system: Secondary | ICD-10-CM | POA: Diagnosis not present

## 2013-06-16 DIAGNOSIS — E669 Obesity, unspecified: Secondary | ICD-10-CM | POA: Insufficient documentation

## 2013-06-16 DIAGNOSIS — G43909 Migraine, unspecified, not intractable, without status migrainosus: Secondary | ICD-10-CM | POA: Insufficient documentation

## 2013-06-16 DIAGNOSIS — M25569 Pain in unspecified knee: Secondary | ICD-10-CM | POA: Insufficient documentation

## 2013-06-16 DIAGNOSIS — Z91040 Latex allergy status: Secondary | ICD-10-CM | POA: Diagnosis not present

## 2013-06-16 DIAGNOSIS — G4730 Sleep apnea, unspecified: Secondary | ICD-10-CM | POA: Insufficient documentation

## 2013-06-16 DIAGNOSIS — Z87440 Personal history of urinary (tract) infections: Secondary | ICD-10-CM | POA: Insufficient documentation

## 2013-06-16 DIAGNOSIS — I517 Cardiomegaly: Secondary | ICD-10-CM | POA: Diagnosis not present

## 2013-06-16 DIAGNOSIS — F3289 Other specified depressive episodes: Secondary | ICD-10-CM | POA: Insufficient documentation

## 2013-06-16 DIAGNOSIS — M25561 Pain in right knee: Secondary | ICD-10-CM

## 2013-06-16 DIAGNOSIS — Z9981 Dependence on supplemental oxygen: Secondary | ICD-10-CM | POA: Insufficient documentation

## 2013-06-16 DIAGNOSIS — F329 Major depressive disorder, single episode, unspecified: Secondary | ICD-10-CM | POA: Insufficient documentation

## 2013-06-16 DIAGNOSIS — Z9889 Other specified postprocedural states: Secondary | ICD-10-CM | POA: Diagnosis not present

## 2013-06-16 DIAGNOSIS — R0602 Shortness of breath: Secondary | ICD-10-CM | POA: Diagnosis not present

## 2013-06-16 DIAGNOSIS — Z3202 Encounter for pregnancy test, result negative: Secondary | ICD-10-CM | POA: Diagnosis not present

## 2013-06-16 DIAGNOSIS — IMO0002 Reserved for concepts with insufficient information to code with codable children: Secondary | ICD-10-CM | POA: Insufficient documentation

## 2013-06-16 DIAGNOSIS — R0989 Other specified symptoms and signs involving the circulatory and respiratory systems: Secondary | ICD-10-CM | POA: Diagnosis not present

## 2013-06-16 DIAGNOSIS — M1710 Unilateral primary osteoarthritis, unspecified knee: Secondary | ICD-10-CM | POA: Diagnosis not present

## 2013-06-16 DIAGNOSIS — Z8619 Personal history of other infectious and parasitic diseases: Secondary | ICD-10-CM | POA: Diagnosis not present

## 2013-06-16 DIAGNOSIS — R0609 Other forms of dyspnea: Secondary | ICD-10-CM | POA: Diagnosis not present

## 2013-06-16 LAB — CBC WITH DIFFERENTIAL/PLATELET
Hemoglobin: 12.2 g/dL (ref 12.0–15.0)
Lymphocytes Relative: 45 % (ref 12–46)
Lymphs Abs: 4.2 10*3/uL — ABNORMAL HIGH (ref 0.7–4.0)
Monocytes Relative: 7 % (ref 3–12)
Neutro Abs: 4.2 10*3/uL (ref 1.7–7.7)
Neutrophils Relative %: 46 % (ref 43–77)
RBC: 3.98 MIL/uL (ref 3.87–5.11)
WBC: 9.2 10*3/uL (ref 4.0–10.5)

## 2013-06-16 LAB — BASIC METABOLIC PANEL
BUN: 10 mg/dL (ref 6–23)
Chloride: 99 mEq/L (ref 96–112)
Glucose, Bld: 111 mg/dL — ABNORMAL HIGH (ref 70–99)
Potassium: 3.5 mEq/L (ref 3.5–5.1)

## 2013-06-16 NOTE — ED Provider Notes (Signed)
History    This chart was scribed for Jessica [name], non-physician practitioner working with Flint Melter, MD by Leone Payor, ED Scribe. This patient was seen in room TR09C/TR09C and the patient's care was started at 2032.   CSN: [id number]  Arrival date & time 06/16/13  2032   First MD Initiated Contact with Patient 06/16/13 2225      Chief Complaint  Patient presents with  . Knee Pain     The history is provided by the patient. No language interpreter was used.    HPI Comments: Jessica [name] is a 52 y.o. female who presents to the Emergency Department complaining of sudden, constant R knee pain that radiates to the right calf starting yesterday. She denies any recent injuries to the affected area. She had R knee surgery on Oct 2013 by Dr Lajoyce Corners. States she had significant swelling yesterday but it has reduced since then. She has associated SOB with walking in the last few days that is not normal for pt. Pt does water aerobics and she last went this morning with no relief. She denies chest pain, fever, chills, abdominal pain, nausea, vomiting. Denies h/o DVT. Denies recent immobilization.    Past Medical History  Diagnosis Date  . Hypertension   . Obesity   . HSV (herpes simplex virus) infection   . Headache(784.0)   . Depression      on meds  . BV (bacterial vaginosis)     recent dx - finish med 12/22/11  . Insomnia     on meds  . Sleep apnea     occas. uses CPAP  . Anal fissure     history  . Arthritis     feet  . Migraines     Past Surgical History  Procedure Laterality Date  . Neck surgery  2001    cervical fusion C4/5  . Cesarean section      x 3  . Tubal ligation  1992  . Cholecystectomy    . Endometrial ablation    . Knee surgery  oct 2013    minesctomy and debridement    Family History  Problem Relation Age of Onset  . Diabetes Mother   . Diabetes Father   . Diabetes Sister   . Cancer Paternal Aunt     cevical  . Cancer Paternal Aunt      cervical    History  Substance Use Topics  . Smoking status: Never Smoker   . Smokeless tobacco: Never Used  . Alcohol Use: No     Comment: socially    OB History   Grav Para Term Preterm Abortions TAB SAB Ect Mult Living   3 3 3       3       Review of Systems  Constitutional: Negative for fever and chills.  Respiratory: Positive for shortness of breath. Negative for cough.   Cardiovascular: Negative for chest pain.  Gastrointestinal: Negative for nausea, vomiting and abdominal pain.  Musculoskeletal: Positive for joint swelling and arthralgias (R knee pain). Negative for myalgias.  Skin: Negative for color change.  Neurological: Negative for weakness and numbness.    Allergies  Ace inhibitors; Angiotensin receptor blockers; Lisinopril; and Latex  Home Medications   Current Outpatient Rx  Name  Route  Sig  Dispense  Refill  . amLODipine (NORVASC) 5 MG tablet   Oral   Take 5 mg by mouth daily.         Marland Kitchen buPROPion (WELLBUTRIN XL) 300  MG 24 hr tablet   Oral   Take 300 mg by mouth daily.         . carbamazepine (TEGRETOL XR) 100 MG 12 hr tablet   Oral   Take 100 mg by mouth 2 (two) times daily.         . cetirizine (ZYRTEC) 10 MG tablet   Oral   Take 10 mg by mouth daily as needed for allergies.         . cyclobenzaprine (FLEXERIL) 10 MG tablet   Oral   Take 10 mg by mouth 3 (three) times daily as needed. For muscle spasms.         . diclofenac sodium (VOLTAREN) 1 % GEL   Topical   Apply 4 g topically 4 (four) times daily as needed (for muscle pain).         . fluticasone (FLONASE) 50 MCG/ACT nasal spray   Nasal   Place 2 sprays into the nose daily as needed for rhinitis or allergies.         . hydrochlorothiazide (HYDRODIURIL) 25 MG tablet   Oral   Take 25 mg by mouth daily.         Marland Kitchen HYDROcodone-acetaminophen (NORCO/VICODIN) 5-325 MG per tablet   Oral   Take 1 tablet by mouth every 6 (six) hours as needed for pain.         Marland Kitchen  ibuprofen (ADVIL,MOTRIN) 800 MG tablet   Oral   Take 800 mg by mouth every 8 (eight) hours as needed for pain.         Marland Kitchen olopatadine (PATANOL) 0.1 % ophthalmic solution   Both Eyes   Place 1 drop into both eyes 2 (two) times daily.         . Prenatal Vit-Fe Fumarate-FA (MULTIVITAMIN-PRENATAL) 27-0.8 MG TABS   Oral   Take 1 tablet by mouth daily at 12 noon.         . topiramate (TOPAMAX) 50 MG tablet   Oral   Take 50 mg by mouth 2 (two) times daily as needed (for migraine).         . valACYclovir (VALTREX) 500 MG tablet   Oral   Take 500 mg by mouth daily.         Marland Kitchen zolpidem (AMBIEN) 5 MG tablet   Oral   Take 5 mg by mouth at bedtime as needed. For insomnia.           BP 156/104  Pulse 102  Temp(Src) 98.6 F (37 C) (Oral)  Resp 18  SpO2 95%  Physical Exam  Nursing note and vitals reviewed. Constitutional: She appears well-developed and well-nourished. No distress.  HENT:  Head: Normocephalic and atraumatic.  Neck: Neck supple.  Cardiovascular: Normal rate and regular rhythm.   Pulmonary/Chest: Effort normal and breath sounds normal. No respiratory distress. She has no wheezes. She has no rales.  Musculoskeletal:       Legs: Right calf tender.  Right knee unremarkable.  No erythema.  Distal pulses intact.  Pt has very large legs.    Neurological: She is alert.  Skin: She is not diaphoretic.    ED Course  Procedures (including critical care time)  DIAGNOSTIC STUDIES: Oxygen Saturation is 95% on RA, adequate by my interpretation.    COORDINATION OF CARE: 11:06 PM Discussed treatment plan with pt at bedside and pt agreed to plan.   Labs Reviewed  CBC WITH DIFFERENTIAL - Abnormal; Notable for the following:    HCT  35.5 (*)    Lymphs Abs 4.2 (*)    All other components within normal limits  BASIC METABOLIC PANEL - Abnormal; Notable for the following:    Glucose, Bld 111 (*)    All other components within normal limits  D-DIMER, QUANTITATIVE -  Abnormal; Notable for the following:    D-Dimer, Quant 0.69 (*)    All other components within normal limits  PREGNANCY, URINE   Dg Chest 2 View  06/16/2013   *RADIOLOGY REPORT*  Clinical Data: Knee pain.  CHEST - 2 VIEW  Comparison: Chest radiograph 12/17/2011.  Findings: Lung volumes are normal.  No consolidative airspace disease.  No pleural effusions.  No pneumothorax.  No pulmonary nodule or mass noted.  Pulmonary vasculature and the cardiomediastinal silhouette are within normal limits.   Surgical clips project over the right upper quadrant of the abdomen, likely from prior cholecystectomy.  IMPRESSION: 1. No radiographic evidence of acute cardiopulmonary disease.   Original Report Authenticated By: Trudie Reed, M.D.   Dg Knee Complete 4 Views Right  06/16/2013   *RADIOLOGY REPORT*  Clinical Data: Knee pain.  RIGHT KNEE - COMPLETE 4+ VIEW  Comparison: No priors.  Findings: Four views of the right knee demonstrate no acute displaced fracture, subluxation, dislocation, joint or soft tissue abnormality.  There is joint space narrowing, subchondral sclerosis and osteophyte formation, most severe in the medial and patellofemoral compartments, compatible with osteoarthritis.  IMPRESSION: 1.  Degenerate changes of osteoarthritis, as above, without acute radiographic abnormality of the right knee.   Original Report Authenticated By: Trudie Reed, M.D.     No diagnosis found.    MDM  Pt with right knee and calf pain x 2 days with significant calf tenderness.  Pt states the calf is swollen but it is unclear visually due to patient's body habitus.  Pt also reports SOB with walking around.  Denies chest pain.  Pt with elevated D-Dimer, have added CT angio chest.  Concern for possible DVT vs right chronic knee pain exacerbation.  I have discussed the patient with Kyung Bacca, PA-C, who assumes care of patient at change of shift.     I personally performed the services described in this  documentation, which was scribed in my presence. The recorded information has been reviewed and is accurate.      [place], PA-C 06/17/13 0003

## 2013-06-16 NOTE — ED Notes (Signed)
Patient presents reporting right knee pain with radiation to right calf.  Patient denies recent injury, negative homans sign, denies sedentary activity.  No swelling, redness or tenderness noted. Patient ambulatory in department.

## 2013-06-16 NOTE — ED Notes (Signed)
Patient transported to X-ray 

## 2013-06-17 ENCOUNTER — Encounter (HOSPITAL_COMMUNITY): Payer: Self-pay | Admitting: Radiology

## 2013-06-17 ENCOUNTER — Emergency Department (HOSPITAL_COMMUNITY): Payer: Medicare Other

## 2013-06-17 DIAGNOSIS — R0602 Shortness of breath: Secondary | ICD-10-CM

## 2013-06-17 DIAGNOSIS — M7989 Other specified soft tissue disorders: Secondary | ICD-10-CM

## 2013-06-17 LAB — POCT PREGNANCY, URINE: Preg Test, Ur: NEGATIVE

## 2013-06-17 MED ORDER — HYDROCODONE-ACETAMINOPHEN 5-325 MG PO TABS: 1.0000 | ORAL_TABLET | Freq: Four times a day (QID) | ORAL | Status: DC | PRN

## 2013-06-17 MED ORDER — IOHEXOL 350 MG/ML SOLN
100.0000 mL | Freq: Once | INTRAVENOUS | Status: AC | PRN
  Administered 2013-06-17: 100 mL via INTRAVENOUS

## 2013-06-17 MED ORDER — ENOXAPARIN SODIUM 150 MG/ML ~~LOC~~ SOLN
130.0000 mg | Freq: Once | SUBCUTANEOUS | Status: DC
  Filled 2013-06-17: qty 1

## 2013-06-17 MED ORDER — MORPHINE SULFATE 4 MG/ML IJ SOLN
4.0000 mg | Freq: Once | INTRAMUSCULAR | Status: AC
  Administered 2013-06-17: 4 mg via INTRAVENOUS
  Filled 2013-06-17: qty 1

## 2013-06-17 NOTE — ED Notes (Signed)
IV will flush with little blood return.  Will continue to assess

## 2013-06-17 NOTE — ED Notes (Signed)
Patient moved to room C24

## 2013-06-17 NOTE — ED Provider Notes (Signed)
Medical screening examination/treatment/procedure(s) were performed by non-physician practitioner and as supervising physician I was immediately available for consultation/collaboration.  Macoy Rodwell L Seanpaul Preece, MD 06/17/13 0054 

## 2013-06-17 NOTE — ED Provider Notes (Signed)
Date: 06/17/2013  Rate: 85  Rhythm: normal sinus rhythm  QRS Axis: normal  Intervals: normal  ST/T Wave abnormalities: nonspecific T wave changes  Conduction Disutrbances:none  Narrative Interpretation:   Old EKG Reviewed: none available    Trixie Dredge, PA-C 06/17/13 8413

## 2013-06-17 NOTE — ED Provider Notes (Signed)
Pt received from [place], [place].  Pt presented to ED w/ RLE pain and SOB.  D-dimer positive.  CTA chest negative for PE.  Results discussed w/ pt.  RLE doppler to r/o DVT pending.  She requests pain medication.  4mg  IV morphine ordered.   4:26 AM   Kirchenko, PA-C to resume care.   Otilio Miu, PA-C 06/18/13 [phone]

## 2013-06-17 NOTE — ED Provider Notes (Signed)
Medical screening examination/treatment/procedure(s) were performed by non-physician practitioner and as supervising physician I was immediately available for consultation/collaboration  Paarth Cropper, MD 06/17/13 1603 

## 2013-06-17 NOTE — Progress Notes (Signed)
Right lower extremity venous duplex completed.  Right:  No evidence of DVT, superficial thrombosis, or Baker's cyst.  Left:  Negative for DVT in the common femoral vein.  

## 2013-06-17 NOTE — ED Notes (Signed)
Patient transported to CT 

## 2013-06-17 NOTE — ED Provider Notes (Signed)
Pt signed out to me at shift change. Pt with right knee pain, radiating into right calf. Pt with surgery last year, states pain since then. Reported some shorness of breath to the other shift, denies any at present. Pt has had thorough work up including CXR, right knee xray, CT angio, and LE venous doppler, all of which came back negative for an acute process. Her knee x-ray did show arthritis. Pt will be d/c home with pain mediations and follow up with her doctor.   Results for orders placed during the hospital encounter of 06/16/13  CBC WITH DIFFERENTIAL      Result Value Range   WBC 9.2  4.0 - 10.5 K/uL   RBC 3.98  3.87 - 5.11 MIL/uL   Hemoglobin 12.2  12.0 - 15.0 g/dL   HCT 16.1 (*) 09.6 - 04.5 %   MCV 89.2  78.0 - 100.0 fL   MCH 30.7  26.0 - 34.0 pg   MCHC 34.4  30.0 - 36.0 g/dL   RDW 40.9  81.1 - 91.4 %   Platelets 319  150 - 400 K/uL   Neutrophils Relative % 46  43 - 77 %   Neutro Abs 4.2  1.7 - 7.7 K/uL   Lymphocytes Relative 45  12 - 46 %   Lymphs Abs 4.2 (*) 0.7 - 4.0 K/uL   Monocytes Relative 7  3 - 12 %   Monocytes Absolute 0.6  0.1 - 1.0 K/uL   Eosinophils Relative 2  0 - 5 %   Eosinophils Absolute 0.2  0.0 - 0.7 K/uL   Basophils Relative 0  0 - 1 %   Basophils Absolute 0.0  0.0 - 0.1 K/uL  BASIC METABOLIC PANEL      Result Value Range   Sodium 136  135 - 145 mEq/L   Potassium 3.5  3.5 - 5.1 mEq/L   Chloride 99  96 - 112 mEq/L   CO2 29  19 - 32 mEq/L   Glucose, Bld 111 (*) 70 - 99 mg/dL   BUN 10  6 - 23 mg/dL   Creatinine, Ser 7.82  0.50 - 1.10 mg/dL   Calcium 9.4  8.4 - 95.6 mg/dL   GFR calc non Af Amer >90  >90 mL/min   GFR calc Af Amer >90  >90 mL/min  D-DIMER, QUANTITATIVE      Result Value Range   D-Dimer, Quant 0.69 (*) 0.00 - 0.48 ug/mL-FEU  POCT PREGNANCY, URINE      Result Value Range   Preg Test, Ur NEGATIVE  NEGATIVE   Dg Chest 2 View  06/16/2013   *RADIOLOGY REPORT*  Clinical Data: Knee pain.  CHEST - 2 VIEW  Comparison: Chest radiograph  12/17/2011.  Findings: Lung volumes are normal.  No consolidative airspace disease.  No pleural effusions.  No pneumothorax.  No pulmonary nodule or mass noted.  Pulmonary vasculature and the cardiomediastinal silhouette are within normal limits.   Surgical clips project over the right upper quadrant of the abdomen, likely from prior cholecystectomy.  IMPRESSION: 1. No radiographic evidence of acute cardiopulmonary disease.   Original Report Authenticated By: Trudie Reed, M.D.   Ct Angio Chest W/cm &/or Wo Cm  06/17/2013   *RADIOLOGY REPORT*  Clinical Data: Right calf pain.  Shortness of breath.  CT ANGIOGRAPHY CHEST  Technique:  Multidetector CT imaging of the chest using the standard protocol during bolus administration of intravenous contrast. Multiplanar reconstructed images including MIPs were obtained and reviewed to evaluate the  vascular anatomy.  Contrast: OMNIPAQUE IOHEXOL 350 MG/ML SOLN  Comparison: Chest CT 12/18/2011.  Findings:  Mediastinum: Study is slightly limited by respiratory motion and image noise from the patient's large body habitus.  With these limitations in mind, there is no evidence to suggest clinically relevant central, lobar or segmental sized filling defects in the pulmonary arteries.  Smaller subsegmental sized filling defects cannot be entirely excluded. Heart size is mildly enlarged. There is no significant pericardial fluid, thickening or pericardial calcification. No pathologically enlarged mediastinal or hilar lymph nodes. Esophagus is unremarkable in appearance.  Lungs/Pleura: No pneumothorax.  No acute consolidative airspace disease.  No pleural effusions.  No definite suspicious appearing pulmonary nodules or masses are identified.  Upper Abdomen: Unremarkable.  Musculoskeletal: There are no aggressive appearing lytic or blastic lesions noted in the visualized portions of the skeleton.  IMPRESSION: 1.  Despite the mild limitations of this examination, there is no  evidence to suggest clinically relevant central, lobar or segmental sized pulmonary embolism. 2.  No acute findings in the thorax to account for the patient's symptoms. 3.  Mild cardiomegaly.   Original Report Authenticated By: Trudie Reed, M.D.   Dg Knee Complete 4 Views Right  06/16/2013   *RADIOLOGY REPORT*  Clinical Data: Knee pain.  RIGHT KNEE - COMPLETE 4+ VIEW  Comparison: No priors.  Findings: Four views of the right knee demonstrate no acute displaced fracture, subluxation, dislocation, joint or soft tissue abnormality.  There is joint space narrowing, subchondral sclerosis and osteophyte formation, most severe in the medial and patellofemoral compartments, compatible with osteoarthritis.  IMPRESSION: 1.  Degenerate changes of osteoarthritis, as above, without acute radiographic abnormality of the right knee.   Original Report Authenticated By: Trudie Reed, M.D.    Author: Smiley Houseman, RVT Service: Vascular Lab Author Type: Cardiovascular Sonographer   Filed: 06/17/2013 8:46 AM Note Time: 06/17/2013 8:44 AM         Right lower extremity venous duplex completed. Right: No evidence of DVT, superficial thrombosis, or Baker's cyst. Left: Negative for DVT in the common femoral vein.       9:30 AM Pt stable for d/c home at this time.      Lottie Mussel, PA-C 06/17/13 1330

## 2013-06-17 NOTE — ED Provider Notes (Signed)
Medical screening examination/treatment/procedure(s) were performed by non-physician practitioner and as supervising physician I was immediately available for consultation/collaboration.  Flint Melter, MD 06/17/13 [phone]

## 2013-06-18 NOTE — ED Provider Notes (Signed)
Medical screening examination/treatment/procedure(s) were performed by non-physician practitioner and as supervising physician I was immediately available for consultation/collaboration.  Eon Zunker L Ceilidh Torregrossa, MD 06/18/13 0855 

## 2013-07-05 ENCOUNTER — Encounter: Payer: Self-pay | Admitting: Sports Medicine

## 2013-07-05 ENCOUNTER — Ambulatory Visit (INDEPENDENT_AMBULATORY_CARE_PROVIDER_SITE_OTHER): Payer: Medicare Other | Admitting: Sports Medicine

## 2013-07-05 VITALS — BP 127/69 | HR 85 | Temp 99.1°F | Ht 61.0 in | Wt 286.0 lb

## 2013-07-05 DIAGNOSIS — R7309 Other abnormal glucose: Secondary | ICD-10-CM | POA: Diagnosis not present

## 2013-07-05 DIAGNOSIS — I10 Essential (primary) hypertension: Secondary | ICD-10-CM

## 2013-07-05 DIAGNOSIS — L819 Disorder of pigmentation, unspecified: Secondary | ICD-10-CM | POA: Diagnosis not present

## 2013-07-05 DIAGNOSIS — M25569 Pain in unspecified knee: Secondary | ICD-10-CM

## 2013-07-05 DIAGNOSIS — M23305 Other meniscus derangements, unspecified medial meniscus, unspecified knee: Secondary | ICD-10-CM | POA: Diagnosis not present

## 2013-07-05 DIAGNOSIS — M25561 Pain in right knee: Secondary | ICD-10-CM

## 2013-07-05 DIAGNOSIS — M23303 Other meniscus derangements, unspecified medial meniscus, right knee: Secondary | ICD-10-CM

## 2013-07-05 DIAGNOSIS — R7303 Prediabetes: Secondary | ICD-10-CM

## 2013-07-05 MED ORDER — CARBAMAZEPINE ER 100 MG PO TB12: 100.0000 mg | ORAL_TABLET | Freq: Two times a day (BID) | ORAL | Status: DC

## 2013-07-05 MED ORDER — OLOPATADINE HCL 0.1 % OP SOLN: 1.0000 [drp] | Freq: Two times a day (BID) | OPHTHALMIC | Status: DC

## 2013-07-05 MED ORDER — TOPIRAMATE 50 MG PO TABS: 50.0000 mg | ORAL_TABLET | Freq: Two times a day (BID) | ORAL | Status: DC | PRN

## 2013-07-05 MED ORDER — CETIRIZINE HCL 10 MG PO TABS: 10.0000 mg | ORAL_TABLET | Freq: Every day | ORAL | Status: DC | PRN

## 2013-07-05 MED ORDER — TRIAMTERENE-HCTZ 37.5-25 MG PO CAPS: 1.0000 | ORAL_CAPSULE | ORAL | Status: DC

## 2013-07-05 MED ORDER — BUPROPION HCL ER (XL) 300 MG PO TB24: 300.0000 mg | ORAL_TABLET | Freq: Every day | ORAL | Status: DC

## 2013-07-05 MED ORDER — FLUTICASONE PROPIONATE 50 MCG/ACT NA SUSP: 2.0000 | Freq: Every day | NASAL | Status: DC | PRN

## 2013-07-05 NOTE — Assessment & Plan Note (Signed)
CBG of 111 on BMET  From emergency department in June

## 2013-07-05 NOTE — Assessment & Plan Note (Signed)
?  Vitiligo vs Tinea Versicolor favor vitiligo Can return for skin scraping - ? referal to dermatology

## 2013-07-05 NOTE — Assessment & Plan Note (Signed)
Having swelling likely associated with amlodipine.  Will stop this medicine today. Change her hydrochlorothiazide to dyazide F/u 2-4 weeks for TLC counseling

## 2013-07-05 NOTE — Assessment & Plan Note (Addendum)
Recurrent Pain and swelling, aching with + McMurray's Injection today - if not improved will likely need repeat imaging and consider re-debridement.  Will likely need TKR at some point in future  R Knee Injection Performed today:  The risks, benefits, and expected outcomes of the injection were reviewed and she wishes to undergo the above named procedure.  After an appropriate time out was taken, the R knee was prepped in a clean fashion and injected from a middle Lateral, bent approach with a 21g syringe using 4cc of 1% plain Lidocaine and 40mg  of DepoMedrol. .  A bandaid was applied to the area.  This procedure was well tolerated and there were no complications.

## 2013-07-05 NOTE — Patient Instructions (Addendum)
It was nice to see you today.   Today we discussed: 1. Pain in joint, lower leg, right We injected your knee today You will need to follow up with Dr. Lajoyce Corners if not improved  2. Essential hypertension, benign Stop your Amlodipine - triamterene-hydrochlorothiazide (DYAZIDE) 37.5-25 MG per capsule; Take 1 each (1 capsule total) by mouth every morning.  Dispense: 30 capsule; Refill: 3    Please plan to return to see me in NUTRITION CLINIC ON THURSDAY.  If you need anything prior to seeing me please call the clinic.  Please Bring all medications with you to each appointment.

## 2013-07-05 NOTE — Assessment & Plan Note (Signed)
Will have follow up in Nutrition Clinic with myself and Dr. Gerilyn Pilgrim on Thursday

## 2013-07-05 NOTE — Progress Notes (Signed)
[facility]  Patient name: Jessica [name] MRN [id number]  Date of birth: 10-25-1961  CC & HPI:  DONIQUE HAMMONDS is a 52 y.o. female presenting today for:  # PAIN: Location  diffuse right knee  Duration  greater than 2 years, has worsened again over the last 2 months   Character  currently does not hurt but definitely bothers her after activity.  Has some clicking.  Hurts her to bend her knee.  No locking.  Has reported some falls.    Radiation  non  Severity  as your out of 10 at this time but gets up to 8/10   Aggrivating  any activity, including exercise lose weight   Therapy Tried  has had a debridement previously with Dr. Lajoyce Corners.  Was doing well for approximately 6 months but seems to have had a progressive decline again.    received injection and February that lasted for greater than one month   RED FLAGS   no fevers, no chills, no joint swelling    #  Hypertension - chronic problem, well controlled.     no orthostasis,   no peripheral edema  no chest pain, no dyspnea on exertion, no orthopnea/PND  no episodes of unilateral weakness, dysarthria or acute visual changes  #  Impaired fasting glucose - chronic problem, very well controlled.   hypoglycemic symptoms/episodes.   no poluria, no polydipsia,  no new visual problems,   # Has been trying to work on her weight loss but is limited by her activity level.  Obesity -    ROS:   Per history of present illness  Pertinent History Reviewed:  Medical & Surgical Hx:  Reviewed: Significant for  obesity, recent bunionectomy of the left great toe still has some swelling in the left lower extremity due to this Medications: Reviewed & Updated - see associated section Social History: Reviewed -  reports that she has never smoked. She has never used smokeless tobacco.  Objective Findings:  Vitals: BP 127/69  Pulse 85  Temp(Src) 99.1 F (37.3 C) (Oral)  Ht 5\' 1"  (1.549 m)  Wt 286 lb (129.729 kg)  BMI  54.07 kg/m2  PE: GENERAL: Adult obese AA  female. In no discomfort; no respiratory distress  PSYCH: Alert and appropriately interactive Insight Mood Affect  Good euthymic appropriate    HNEENT:  AT/[place], trachea midline MMM, no scleral icterus, no conjunctival exudate  CARDIO:  Rate & Rhythm Cardiac Sounds Murmurs  RRR S1/S2 NO murmur    LUNGS:  CTA B, no wheezes, no crackles  ABDOMEN:    EXTREM: moves all 4 extremities spontaneously, no gross lateralization warm & well perfused, none lesions   LE Edema Capillary Refill Pulses and a new   No edema <2 second  2+ out of 4 in DP and PT     GU:   SKIN:   NEUROMSK:  right knee Exam: Skin:  normal   Alignment & Deformity:  degenerative change and obese   Palpation:  tenderness palpation over the medial joint line, no Baker cyst, no patellar blot and, no crepitation   Spasm:   Active ROM:  full ,   Passive ROM:   Sensation Testing:  grossly intact   Myotome Testing:  lower extremity myotomes 5+ out of 5 diffusely   Muscle Testing:   Special Testing:  positive McMurray's, positive Thessaly, negative anterior posterior drawer, negative Lachman's         Assessment & Plan:

## 2013-07-07 ENCOUNTER — Ambulatory Visit (INDEPENDENT_AMBULATORY_CARE_PROVIDER_SITE_OTHER): Payer: Medicare Other | Admitting: Sports Medicine

## 2013-07-07 NOTE — Progress Notes (Signed)
  [facility]  Patient name: Jessica [name] MRN [id number]  Date of birth: [birth year]  CC & HPI:  Jessica [name] is a 52 y.o. female presenting today to Nutrition Clinic.  Nutrition/Dietary History: (UP at  AM) - 0630 - exercise bands B ( AM) - Herbalife Shake daily, was previously doing Herbalife Tea Snk ( AM) -   L ( PM)- greek yogurt, canned peaches, granola, bananna - daily Snk ( PM) -   D ( PM) - chicken salad chicken bite breasts, curly fries, 1/2 glass (1cup) Orange Juice Snk ( PM) -    *minimal vegetables, manufactured shakes   Preferred Beverages:   Sweet tea - 1 [employer] sweet tea will last X 2 days. ~qweek  Otherwise water currently  Previously Herbalife Tea   ACTIVITY/EXERCISE HISTORY:  Occupation: 2 days per week - [employer] @ [employer]  Leisure time activities: qAM exercise bands; walking daily, 5# weights each night, dancing,   Formal Exercise: (> 50% Max HR)  Walking   1.5 hours / week}   Location: Around home to nearby school  Reports short word sentences    Pertinent History Reviewed:  Medical & Surgical Hx:  Reviewed: Significant for HTN, prediabetes, obesity Medications: Reviewed & Updated - see associated section Social History: Reviewed -  reports that she has never smoked. She has never used smokeless tobacco.  Objective Findings:  Vitals: Ht 5\' 1"  (1.549 m)  Wt 285 lb (129.275 kg)  BMI 53.88 kg/m2  PE: GENERAL:  Adult obese AA  female. In no discomfort; no respiratory distress. PSYCH: Alert and appropriately interactive; Insight:Good     Assessment & Plan:

## 2013-07-07 NOTE — Assessment & Plan Note (Addendum)
>  50% of this 30 minute visit spent in direct patient counseling and/or coordination of care. Discussed try to increase her vegetable intake to not go more than 5 hours between eating Substitute toast and eggs with spinach potentially for breakfast in the morning instead of her Herbalife shake. Increase her NEAT, get a pedometer - ultimately goal of 10,000 steps per day

## 2013-07-20 ENCOUNTER — Telehealth: Payer: Self-pay | Admitting: Sports Medicine

## 2013-07-20 DIAGNOSIS — I10 Essential (primary) hypertension: Secondary | ICD-10-CM

## 2013-07-20 NOTE — Telephone Encounter (Signed)
Pt is requesting that Dr. Berline Chough re-send all her medications to [facility] at [facility]. The [employer] that she was using has caused to many issue and she has not gotten any of her medication yet. JW.

## 2013-07-20 NOTE — Telephone Encounter (Signed)
Will fwd to Md.  Justinn Welter L, CMA  

## 2013-07-21 ENCOUNTER — Ambulatory Visit (INDEPENDENT_AMBULATORY_CARE_PROVIDER_SITE_OTHER): Payer: Medicaid Other | Admitting: Family Medicine

## 2013-07-21 ENCOUNTER — Encounter: Payer: Self-pay | Admitting: Family Medicine

## 2013-07-21 VITALS — BP 133/69 | HR 72 | Temp 98.9°F | Ht 61.0 in | Wt 286.2 lb

## 2013-07-21 DIAGNOSIS — B001 Herpesviral vesicular dermatitis: Secondary | ICD-10-CM

## 2013-07-21 DIAGNOSIS — B0089 Other herpesviral infection: Secondary | ICD-10-CM

## 2013-07-21 DIAGNOSIS — B009 Herpesviral infection, unspecified: Secondary | ICD-10-CM | POA: Diagnosis not present

## 2013-07-21 MED ORDER — ACYCLOVIR 5 % EX CREA: 1.0000 "application " | TOPICAL_CREAM | CUTANEOUS | Status: DC

## 2013-07-21 MED ORDER — VALACYCLOVIR HCL 500 MG PO TABS: 500.0000 mg | ORAL_TABLET | Freq: Every day | ORAL | Status: DC

## 2013-07-22 DIAGNOSIS — B0089 Other herpesviral infection: Secondary | ICD-10-CM | POA: Insufficient documentation

## 2013-07-22 MED ORDER — CETIRIZINE HCL 10 MG PO TABS: 10.0000 mg | ORAL_TABLET | Freq: Every day | ORAL | Status: DC | PRN

## 2013-07-22 MED ORDER — OLOPATADINE HCL 0.1 % OP SOLN: 1.0000 [drp] | Freq: Two times a day (BID) | OPHTHALMIC | Status: DC

## 2013-07-22 MED ORDER — BUPROPION HCL ER (XL) 300 MG PO TB24: 300.0000 mg | ORAL_TABLET | Freq: Every day | ORAL | Status: DC

## 2013-07-22 MED ORDER — CARBAMAZEPINE ER 100 MG PO TB12: 100.0000 mg | ORAL_TABLET | Freq: Two times a day (BID) | ORAL | Status: DC

## 2013-07-22 MED ORDER — TOPIRAMATE 50 MG PO TABS: 50.0000 mg | ORAL_TABLET | Freq: Two times a day (BID) | ORAL | Status: DC | PRN

## 2013-07-22 MED ORDER — TRIAMTERENE-HCTZ 37.5-25 MG PO CAPS: 1.0000 | ORAL_CAPSULE | ORAL | Status: DC

## 2013-07-22 MED ORDER — FLUTICASONE PROPIONATE 50 MCG/ACT NA SUSP: 2.0000 | Freq: Every day | NASAL | Status: DC | PRN

## 2013-07-22 NOTE — Progress Notes (Signed)
Family Medicine Office Visit Note   Subjective:   Patient ID: Jessica [name], female  DOB: May 14, 1961, 52 y.o.. MRN: [id number]   Pt that comes today for same-day appointment complaining of recent mouth and tongue lesions. She reports last Thursday she started with intense pain on the right lateral aspect of her tongue followed by a cold sore in the area. She put Orajel on it without improvement. Next morning the lesion was gone but she continued feeling pain in the same area that exacerbated with the ingestion of ketchup or pineapple. 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 She had several questions about the transmission of this condition, but denies preoccupation about STD's since she states is not sexually active for the past 3 years.   Review of Systems:  Per HPI.  Objective:   Physical Exam: Gen:  NAD HEENT: Moist mucous membranes. Mouth: no tongue or lip skin with active open lesions seen.   CV: Regular rate and rhythm, no murmurs rubs or gallops PULM: Clear to auscultation bilaterally. No wheezes/rales/rhonchi Neuro: Alert and oriented x3. No focalization  Assessment & Plan:

## 2013-07-22 NOTE — Assessment & Plan Note (Signed)
Hx reported by pt and on Valtrex for prophylaxis. I did not see any outbreak reported by pt on my physical exam. P/ Refilled her Valtrex since she reported ran out.  Pt has been using acyclovir in the past with good results and request to refill this medication. Discussed that topical treatment is not superior to oral prophylactic therapy. Pt continues to ask for refill regarding information given. No harm on topical use of acyclovir so prescription is refilled.

## 2013-07-22 NOTE — Patient Instructions (Addendum)
I don't see an active HSV outbreak, nevertheless with your history I will issue another prescription of Valtrex and recommend f/u with your doctor. As we have discussed topical treatment is not superior to oral prophylactic treatment with Valtrex.

## 2013-07-22 NOTE — Telephone Encounter (Signed)
rx sent in to [facility] @ [address]

## 2013-07-25 ENCOUNTER — Other Ambulatory Visit: Payer: Self-pay | Admitting: *Deleted

## 2013-07-25 DIAGNOSIS — I10 Essential (primary) hypertension: Secondary | ICD-10-CM

## 2013-07-25 DIAGNOSIS — B001 Herpesviral vesicular dermatitis: Secondary | ICD-10-CM

## 2013-07-25 MED ORDER — VALACYCLOVIR HCL 500 MG PO TABS: 500.0000 mg | ORAL_TABLET | Freq: Every day | ORAL | Status: DC

## 2013-07-25 MED ORDER — CYCLOBENZAPRINE HCL 10 MG PO TABS: 10.0000 mg | ORAL_TABLET | Freq: Three times a day (TID) | ORAL | Status: DC | PRN

## 2013-07-25 MED ORDER — TRIAMTERENE-HCTZ 37.5-25 MG PO CAPS: 1.0000 | ORAL_CAPSULE | ORAL | Status: DC

## 2013-08-02 ENCOUNTER — Other Ambulatory Visit: Payer: Self-pay | Admitting: *Deleted

## 2013-08-02 DIAGNOSIS — I10 Essential (primary) hypertension: Secondary | ICD-10-CM

## 2013-08-02 DIAGNOSIS — B001 Herpesviral vesicular dermatitis: Secondary | ICD-10-CM

## 2013-08-02 MED ORDER — VALACYCLOVIR HCL 500 MG PO TABS: 500.0000 mg | ORAL_TABLET | Freq: Every day | ORAL | Status: DC

## 2013-08-02 MED ORDER — TRIAMTERENE-HCTZ 37.5-25 MG PO CAPS: 1.0000 | ORAL_CAPSULE | ORAL | Status: DC

## 2013-08-02 MED ORDER — CYCLOBENZAPRINE HCL 10 MG PO TABS: 10.0000 mg | ORAL_TABLET | Freq: Three times a day (TID) | ORAL | Status: DC | PRN

## 2013-08-03 ENCOUNTER — Ambulatory Visit: Payer: Medicare Other | Admitting: Sports Medicine

## 2013-08-08 DIAGNOSIS — M1710 Unilateral primary osteoarthritis, unspecified knee: Secondary | ICD-10-CM | POA: Diagnosis not present

## 2013-08-22 ENCOUNTER — Other Ambulatory Visit: Payer: Self-pay | Admitting: *Deleted

## 2013-08-22 DIAGNOSIS — I10 Essential (primary) hypertension: Secondary | ICD-10-CM

## 2013-08-23 ENCOUNTER — Other Ambulatory Visit: Payer: Self-pay | Admitting: *Deleted

## 2013-08-23 DIAGNOSIS — I10 Essential (primary) hypertension: Secondary | ICD-10-CM

## 2013-08-24 ENCOUNTER — Encounter: Payer: Self-pay | Admitting: Sports Medicine

## 2013-08-24 ENCOUNTER — Ambulatory Visit (INDEPENDENT_AMBULATORY_CARE_PROVIDER_SITE_OTHER): Payer: Medicare Other | Admitting: Sports Medicine

## 2013-08-24 VITALS — BP 133/90 | HR 91 | Temp 98.1°F | Ht 61.0 in | Wt 275.1 lb

## 2013-08-24 DIAGNOSIS — R109 Unspecified abdominal pain: Secondary | ICD-10-CM

## 2013-08-24 DIAGNOSIS — I10 Essential (primary) hypertension: Secondary | ICD-10-CM | POA: Diagnosis not present

## 2013-08-24 MED ORDER — POLYETHYLENE GLYCOL 3350 17 GM/SCOOP PO POWD: 17.0000 g | Freq: Two times a day (BID) | ORAL | Status: DC

## 2013-08-24 MED ORDER — TRIAMTERENE-HCTZ 37.5-25 MG PO CAPS: 1.0000 | ORAL_CAPSULE | ORAL | Status: DC

## 2013-08-24 NOTE — Patient Instructions (Addendum)
It was good to see you today.  Please start taking MiraLax twice a day for the next week.  After that the once per day for an additional week.  Then you should add Metamucil to your daily regimen.  You're doing incredible job with your weight loss.  Keep up the dietary lifestyle changes we discussed previously.  Please follow with me as needed.

## 2013-08-24 NOTE — Assessment & Plan Note (Signed)
Likely some aspect of chronic constipation.  No red flags.  Start MiraLax.  Had Metamucil once off MiraLax.  Consider irritable bowel syndrome as underlying etiology of continues to be problematic in the future.

## 2013-08-24 NOTE — Progress Notes (Signed)
  [facility]  Patient name: Jessica [name] MRN [id number]  Date of birth: 03-Jan-1961  CC & HPI:  Jessica [name] is a 52 y.o. female presenting to clinic.  Patient is here to followup on her weight loss attempts.  She reports significant lifestyle intervention including dietary and activity changes.  She does report that over the past one week she's been having bilateral upper abdominal pain.  She reports feeling bloated.  She denies any nausea, vomiting.  She has had constipation and hard stools.  She has not been taking any laxatives at this time on a regular basis but did try MiraLax twice without success.  Pt denies chest pain, dyspnea at rest or exertion, PND, lower extremity edema.Patient denies any facial asymmetry, unilateral weakness, or dysarthria.  She denies any fevers, chills,   ROS:  PER HPI  Pertinent History Reviewed:  Medical & Surgical Hx:  Reviewed: Significant for obesity, migraines, hypertension Medications: Reviewed & Updated - see associated section Social History: Reviewed -  reports that she has never smoked. She has never used smokeless tobacco.  Objective Findings:  Vitals: BP 133/90  Pulse 91  Temp(Src) 98.1 F (36.7 C) (Oral)  Ht 5\' 1"  (1.549 m)  Wt 275 lb 1.6 oz (124.785 kg)  BMI 52.01 kg/m2 PE: GENERAL:  adult obese African American female. In no discomfort; no respiratory distress  PSYCH:  alert and appropriate, good insight   HNEENT:    CARDIO:  RRR, S1/S2 heard, no murmur  LUNGS:  CTA B, no wheezes, no crackles  ABDOMEN:   abdomen is obese , soft, nontender, no masses felt no focal tenderness to palpation , negative Murphy's, negative McBurney's   EXTREM:  moves all 4 extremities spontaneously without lateralization.  No peripheral edema   GU:   SKIN:   NEUROMSK:     Assessment & Plan:  No diagnosis found. See problem associated charting

## 2013-08-24 NOTE — Assessment & Plan Note (Signed)
>  50% of this 25 minute visit spent in direct patient counseling and/or coordination of care.  Patient has had a 10 pound weight loss since her last visit a month ago. Encourage patient to continue regular weight loss including dietary and activity modifications. Patient followup in 3 months unless having difficulty with continued weight loss.

## 2013-09-23 ENCOUNTER — Telehealth: Payer: Self-pay | Admitting: Sports Medicine

## 2013-09-23 NOTE — Telephone Encounter (Signed)
Pt is requesting a refill on her ambien. JW

## 2013-09-23 NOTE — Telephone Encounter (Signed)
Spoke with patient,explained that she would need to see Dr Berline Chough first before he could prescribe medication since he wasn't the prescribe of the original RX,she voiced understanding and will schedule a follow up with Dr Berline Chough. Jessica [name], Virgel Bouquet

## 2013-09-30 ENCOUNTER — Encounter: Payer: Self-pay | Admitting: Family Medicine

## 2013-09-30 ENCOUNTER — Ambulatory Visit (INDEPENDENT_AMBULATORY_CARE_PROVIDER_SITE_OTHER): Payer: Medicare Other | Admitting: Family Medicine

## 2013-09-30 VITALS — BP 155/96 | HR 90 | Temp 98.4°F | Ht 61.0 in | Wt 272.0 lb

## 2013-09-30 DIAGNOSIS — N760 Acute vaginitis: Secondary | ICD-10-CM

## 2013-09-30 DIAGNOSIS — N898 Other specified noninflammatory disorders of vagina: Secondary | ICD-10-CM | POA: Diagnosis not present

## 2013-09-30 DIAGNOSIS — N952 Postmenopausal atrophic vaginitis: Secondary | ICD-10-CM

## 2013-09-30 HISTORY — DX: Postmenopausal atrophic vaginitis: N95.2

## 2013-09-30 LAB — POCT WET PREP (WET MOUNT)

## 2013-09-30 MED ORDER — IBUPROFEN 800 MG PO TABS
800.0000 mg | ORAL_TABLET | Freq: Three times a day (TID) | ORAL | Status: DC | PRN
Start: 2013-09-30 — End: 2013-11-26

## 2013-09-30 MED ORDER — FLUCONAZOLE 150 MG PO TABS: 150.0000 mg | ORAL_TABLET | Freq: Once | ORAL | Status: DC

## 2013-09-30 NOTE — Progress Notes (Signed)
Patient ID: Jessica [name], female   DOB: March 27, 1961, 52 y.o.   MRN: [id number]  [name] Jessica M. Hairford, MD Phone: [phone]   Subjective: HPI: Patient is a 52 y.o. female presenting to clinic today for same day appointment.  Vaginitis Patient presents for evaluation of an abnormal vaginal discharge. Symptoms have been present for several days. Vaginal symptoms: odor and pain. Contraception: none. She denies fever, urinary sx, burning. Sexually transmitted infection risk: very low risk of STD exposure. Menstrual flow: rare, LMP prior to ablation.   History Reviewed: Non-smoker. Health Maintenance: Needs flu shot today  ROS: Please see HPI above.  Objective: Office vital signs reviewed. BP 155/96  Pulse 90  Temp(Src) 98.4 F (36.9 C) (Oral)  Ht 5\' 1"  (1.549 m)  Wt 272 lb (123.378 kg)  BMI 51.42 kg/m2  Physical Examination:  General: Awake, alert. NAD Pulm: CTAB, no wheezes Cardio: RRR, no murmurs appreciated Abdomen:+BS, soft, nontender, nondistended. No CVA tenderness GU: No external lesions. Thick, white discharge in vaginal vault. No CMT. Mild right adnexal tenderness Extremities: No edema Neuro: Grossly intact  Assessment: 52 y.o. female with vaginal discharge  Plan: See Problem List and After Visit Summary

## 2013-09-30 NOTE — Patient Instructions (Addendum)
If your symptoms do not improve, please let us know but I hope you feel better.  Jessica [name] M. Eric Nees, M.D.

## 2013-09-30 NOTE — Assessment & Plan Note (Addendum)
Wet prep collected. Shows yeast. Treated with Diflucan. Also given ibuprofen 800mg  to take as needed for pain. F/u if symptoms fail to improve.

## 2013-10-03 ENCOUNTER — Emergency Department (HOSPITAL_COMMUNITY)
Admission: EM | Admit: 2013-10-03 | Discharge: 2013-10-04 | Disposition: A | Discharge status: Home / Self Care | Payer: Medicare Other | Attending: Emergency Medicine | Admitting: Emergency Medicine

## 2013-10-03 ENCOUNTER — Encounter (HOSPITAL_COMMUNITY): Payer: Self-pay | Admitting: *Deleted

## 2013-10-03 DIAGNOSIS — Z3202 Encounter for pregnancy test, result negative: Secondary | ICD-10-CM | POA: Diagnosis not present

## 2013-10-03 DIAGNOSIS — I10 Essential (primary) hypertension: Secondary | ICD-10-CM | POA: Insufficient documentation

## 2013-10-03 DIAGNOSIS — R350 Frequency of micturition: Secondary | ICD-10-CM | POA: Diagnosis not present

## 2013-10-03 DIAGNOSIS — Z8619 Personal history of other infectious and parasitic diseases: Secondary | ICD-10-CM | POA: Insufficient documentation

## 2013-10-03 DIAGNOSIS — Z8719 Personal history of other diseases of the digestive system: Secondary | ICD-10-CM | POA: Diagnosis not present

## 2013-10-03 DIAGNOSIS — R109 Unspecified abdominal pain: Secondary | ICD-10-CM | POA: Diagnosis not present

## 2013-10-03 DIAGNOSIS — G4700 Insomnia, unspecified: Secondary | ICD-10-CM | POA: Insufficient documentation

## 2013-10-03 DIAGNOSIS — G4730 Sleep apnea, unspecified: Secondary | ICD-10-CM | POA: Insufficient documentation

## 2013-10-03 DIAGNOSIS — Z8659 Personal history of other mental and behavioral disorders: Secondary | ICD-10-CM | POA: Diagnosis not present

## 2013-10-03 DIAGNOSIS — E669 Obesity, unspecified: Secondary | ICD-10-CM | POA: Insufficient documentation

## 2013-10-03 DIAGNOSIS — Z91040 Latex allergy status: Secondary | ICD-10-CM | POA: Diagnosis not present

## 2013-10-03 DIAGNOSIS — Z8742 Personal history of other diseases of the female genital tract: Secondary | ICD-10-CM | POA: Diagnosis not present

## 2013-10-03 DIAGNOSIS — R110 Nausea: Secondary | ICD-10-CM | POA: Diagnosis not present

## 2013-10-03 DIAGNOSIS — Z79899 Other long term (current) drug therapy: Secondary | ICD-10-CM | POA: Diagnosis not present

## 2013-10-03 DIAGNOSIS — R1032 Left lower quadrant pain: Secondary | ICD-10-CM | POA: Insufficient documentation

## 2013-10-03 DIAGNOSIS — M129 Arthropathy, unspecified: Secondary | ICD-10-CM | POA: Diagnosis not present

## 2013-10-03 DIAGNOSIS — G43909 Migraine, unspecified, not intractable, without status migrainosus: Secondary | ICD-10-CM | POA: Insufficient documentation

## 2013-10-03 LAB — COMPREHENSIVE METABOLIC PANEL
ALT: 21 U/L (ref 0–35)
AST: 22 U/L (ref 0–37)
Albumin: 3.7 g/dL (ref 3.5–5.2)
Calcium: 9.1 mg/dL (ref 8.4–10.5)
Creatinine, Ser: 0.81 mg/dL (ref 0.50–1.10)
Sodium: 139 mEq/L (ref 135–145)
Total Protein: 8.8 g/dL — ABNORMAL HIGH (ref 6.0–8.3)

## 2013-10-03 LAB — URINALYSIS, ROUTINE W REFLEX MICROSCOPIC
Bilirubin Urine: NEGATIVE
Glucose, UA: NEGATIVE mg/dL
Hgb urine dipstick: NEGATIVE
Protein, ur: NEGATIVE mg/dL
Specific Gravity, Urine: 1.013 (ref 1.005–1.030)
pH: 6 (ref 5.0–8.0)

## 2013-10-03 LAB — CBC WITH DIFFERENTIAL/PLATELET
Basophils Absolute: 0 10*3/uL (ref 0.0–0.1)
Basophils Relative: 0 % (ref 0–1)
Eosinophils Absolute: 0 10*3/uL (ref 0.0–0.7)
Eosinophils Relative: 0 % (ref 0–5)
HCT: 38.6 % (ref 36.0–46.0)
Lymphocytes Relative: 14 % (ref 12–46)
MCHC: 36.5 g/dL — ABNORMAL HIGH (ref 30.0–36.0)
MCV: 88.3 fL (ref 78.0–100.0)
Monocytes Absolute: 0.8 10*3/uL (ref 0.1–1.0)
Neutro Abs: 14.6 10*3/uL — ABNORMAL HIGH (ref 1.7–7.7)
Platelets: 318 10*3/uL (ref 150–400)
RDW: 13.8 % (ref 11.5–15.5)
WBC: 17.9 10*3/uL — ABNORMAL HIGH (ref 4.0–10.5)

## 2013-10-03 LAB — PREGNANCY, URINE: Preg Test, Ur: NEGATIVE

## 2013-10-03 MED ORDER — MORPHINE SULFATE 4 MG/ML IJ SOLN
6.0000 mg | Freq: Once | INTRAMUSCULAR | Status: AC
  Administered 2013-10-03: 6 mg via INTRAVENOUS
  Filled 2013-10-03: qty 2

## 2013-10-03 MED ORDER — ONDANSETRON HCL 4 MG/2ML IJ SOLN
4.0000 mg | Freq: Once | INTRAMUSCULAR | Status: AC
  Administered 2013-10-03: 4 mg via INTRAVENOUS
  Filled 2013-10-03: qty 2

## 2013-10-03 MED ORDER — SODIUM CHLORIDE 0.9 % IV BOLUS (SEPSIS)
1000.0000 mL | Freq: Once | INTRAVENOUS | Status: AC
  Administered 2013-10-03: 1000 mL via INTRAVENOUS

## 2013-10-03 NOTE — ED Notes (Signed)
Pt states she is taking 800mg  ibuprofen and it is not helping.  States she went to her doctor Friday and was dx with a yeast infection.  Took prescribed med Friday and Sunday without relief. Pain is so severe she is vomiting. States it feels like it did with my gallbladder.

## 2013-10-03 NOTE — ED Notes (Signed)
Pt is here with worsening lower abdominal, pain through pelvis area, and down legs for the past 2 weeks.  Pt reports lower back pain.  Recently treated for yeast infection.

## 2013-10-03 NOTE — ED Notes (Signed)
Attempted to collect CBC but was only successful on obtaining the CMP

## 2013-10-04 ENCOUNTER — Telehealth: Payer: Self-pay | Admitting: Sports Medicine

## 2013-10-04 ENCOUNTER — Emergency Department (HOSPITAL_COMMUNITY): Payer: Medicare Other

## 2013-10-04 DIAGNOSIS — R109 Unspecified abdominal pain: Secondary | ICD-10-CM

## 2013-10-04 IMAGING — CR DG KNEE COMPLETE 4+V*R*
4 series · 4 of 4 positions shown · non-contrast
Comparison: 02/03/2012

CLINICAL DATA: Fall, anterior knee pain

RIGHT KNEE - COMPLETE 4+ VIEW

[t knee ap right *]
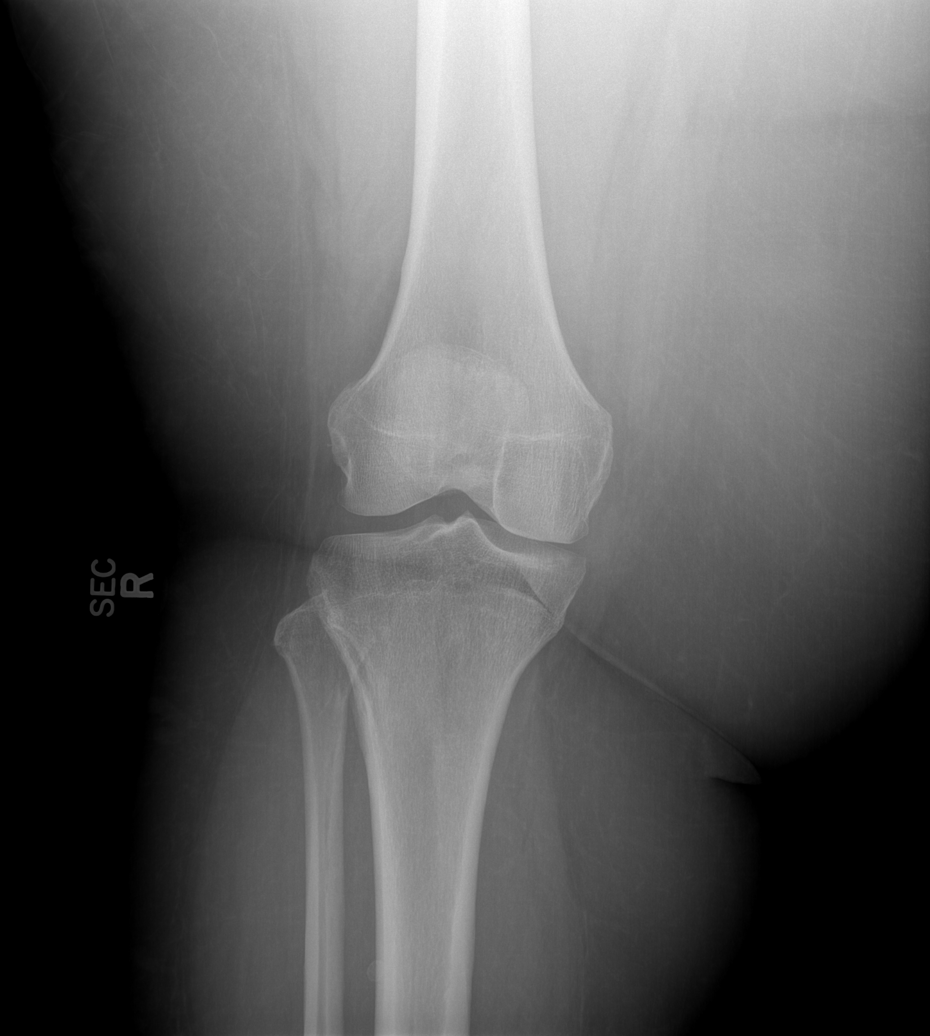

[t knee oblique right * (1 of 2)]
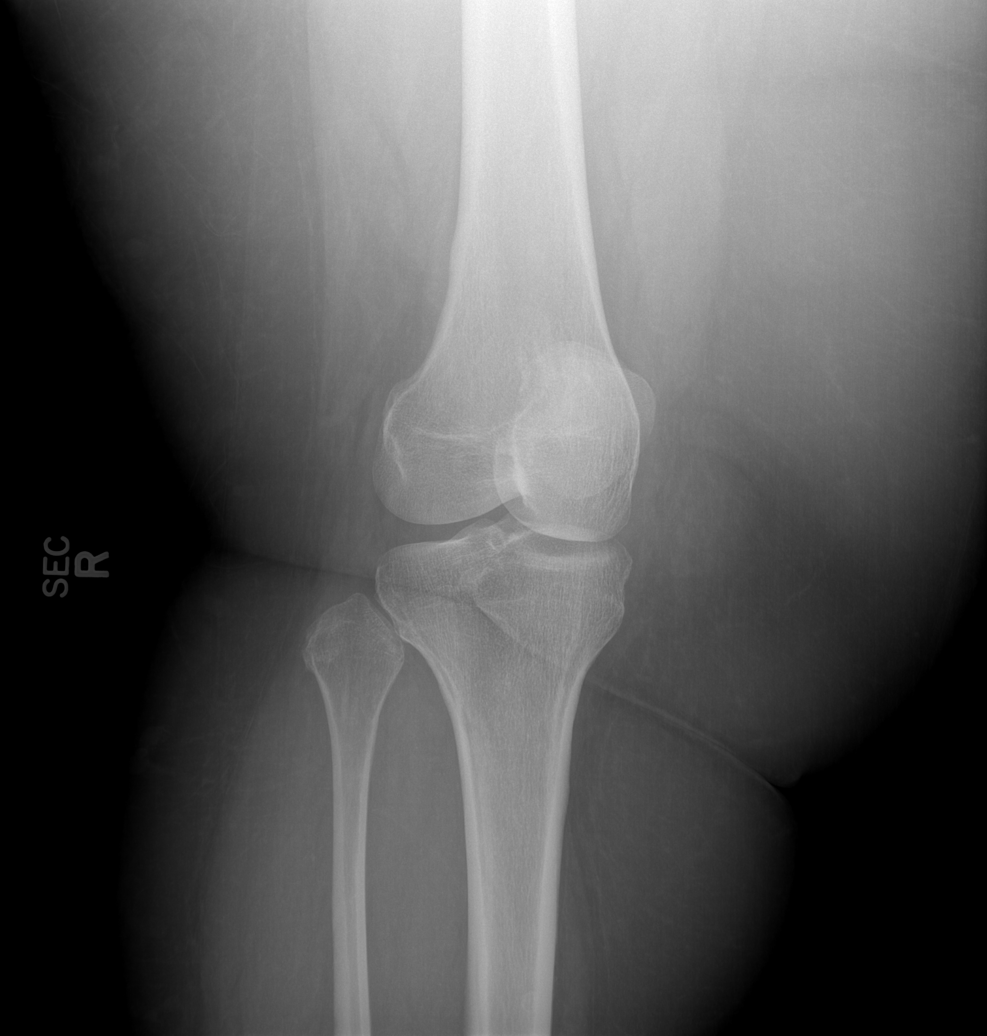

[t knee oblique right * (2 of 2)]
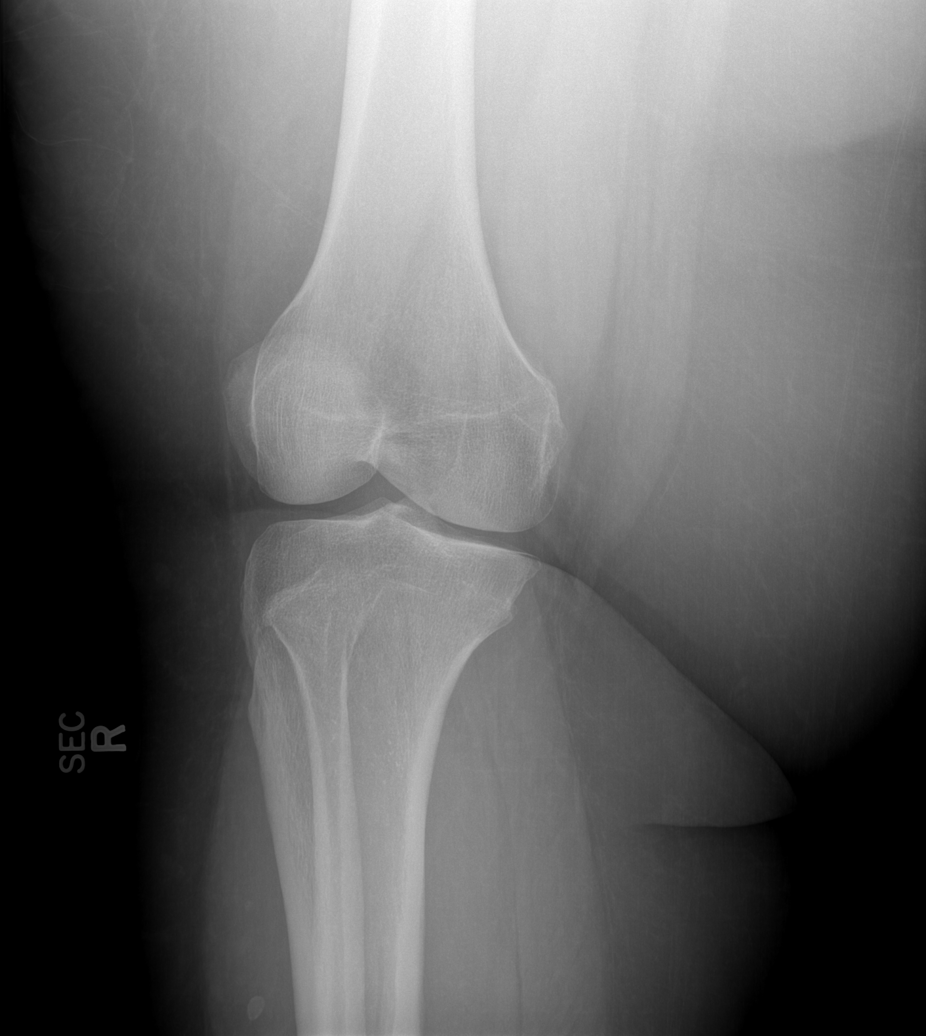

[t knee lat right *]
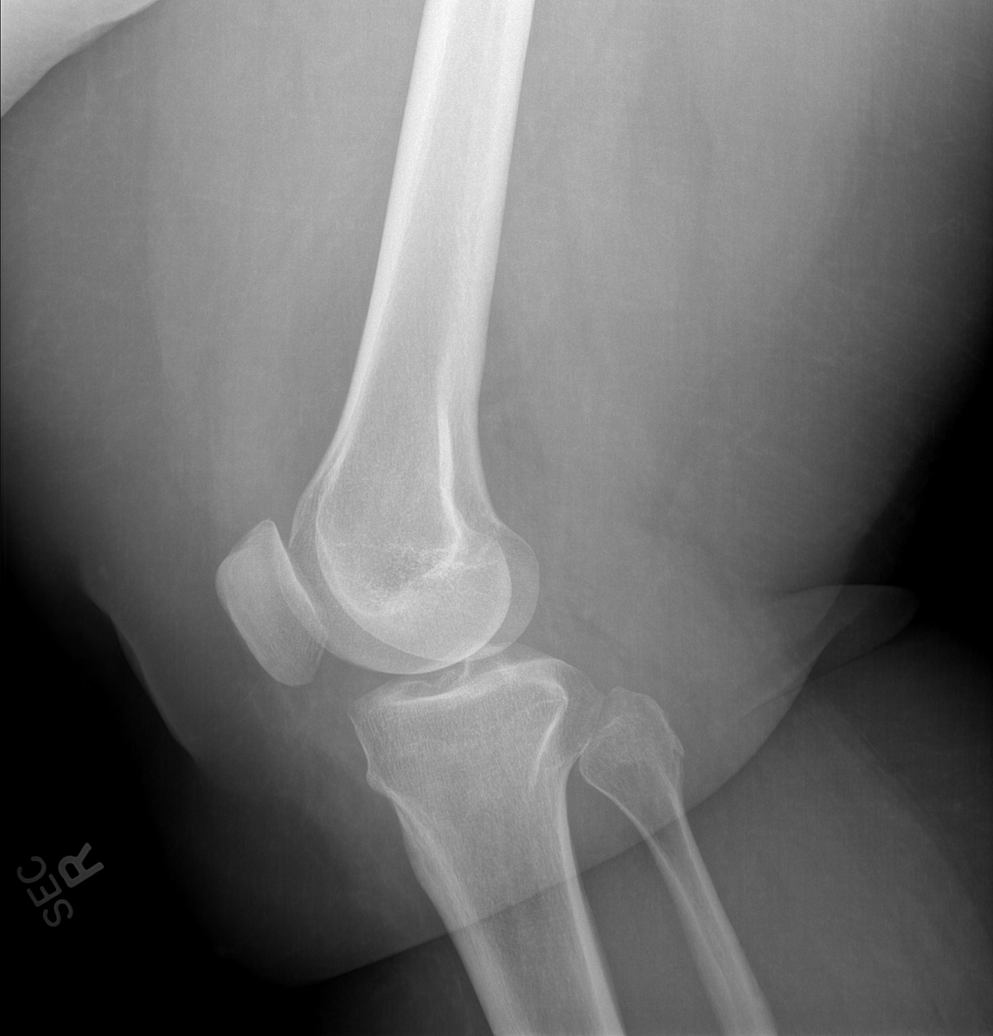

[4 of 4 positions shown; findings below may reference images not displayed]

FINDINGS: No fracture or dislocation is seen.

The joint spaces are preserved.

Visualized soft tissues are grossly unremarkable.

No definite suprapatellar knee joint effusion.
IMPRESSION: No fracture or dislocation is seen.

## 2013-10-04 MED ORDER — HYDROCODONE-ACETAMINOPHEN 5-325 MG PO TABS: 1.0000 | ORAL_TABLET | ORAL | Status: DC | PRN

## 2013-10-04 MED ORDER — ONDANSETRON 8 MG PO TBDP: 8.0000 mg | ORAL_TABLET | Freq: Three times a day (TID) | ORAL | Status: DC | PRN

## 2013-10-04 NOTE — ED Provider Notes (Signed)
CSN: [id number]     Arrival date & time 10/03/13  1835 History   First MD Initiated Contact with Patient 10/03/13 2300     Chief Complaint  Patient presents with  . Abdominal Pain    HPI Patient presented 2 weeks of intermittent left-sided abdominal pain with some radiation up towards her left flank.  No history kidney stones.  She reports nausea without vomiting.  She denies diarrhea.  She denies melena or hematochezia.  No fevers or chills.  She reports some of this pain is now located in her bilateral lower abdomen and radiates to her bilateral thighs.  She denies weakness in her legs.  She denies vaginal complaints.  No vaginal bleeding or vaginal discharge.  Some urinary frequency without dysuria.  Symptoms are mild to moderate in severity.  She's had nausea without vomiting.    Past Medical History  Diagnosis Date  . Hypertension   . Obesity   . HSV (herpes simplex virus) infection   . Headache(784.0)   . Depression      on meds  . BV (bacterial vaginosis)     recent dx - finish med 12/22/11  . Insomnia     on meds  . Sleep apnea     occas. uses CPAP  . Anal fissure     history  . Arthritis     feet  . Migraines    Past Surgical History  Procedure Laterality Date  . Neck surgery  2001    cervical fusion C4/5  . Cesarean section      x 3  . Tubal ligation  1992  . Cholecystectomy    . Endometrial ablation    . Knee surgery  oct 2013    minesctomy and debridement   Family History  Problem Relation Age of Onset  . Diabetes Mother   . Diabetes Father   . Diabetes Sister   . Cancer Paternal Aunt     cevical  . Cancer Paternal Aunt     cervical   History  Substance Use Topics  . Smoking status: Never Smoker   . Smokeless tobacco: Never Used  . Alcohol Use: No     Comment: socially   OB History   Grav Para Term Preterm Abortions TAB SAB Ect Mult Living   3 3 3       3      Review of Systems  All other systems reviewed and are negative.    Allergies   Ace inhibitors; Angiotensin receptor blockers; Lisinopril; and Latex  Home Medications   Current Outpatient Rx  Name  Route  Sig  Dispense  Refill  . amLODipine (NORVASC) 5 MG tablet   Oral   Take 5 mg by mouth daily.          . carbamazepine (TEGRETOL XR) 100 MG 12 hr tablet   Oral   Take 1 tablet (100 mg total) by mouth 2 (two) times daily.   60 tablet   1   . cetirizine (ZYRTEC) 10 MG tablet   Oral   Take 1 tablet (10 mg total) by mouth daily as needed for allergies.   30 tablet   5   . cyclobenzaprine (FLEXERIL) 10 MG tablet   Oral   Take 1 tablet (10 mg total) by mouth 3 (three) times daily as needed. For muscle spasms.   30 tablet   2   . diclofenac sodium (VOLTAREN) 1 % GEL   Topical   Apply 4 g  topically 4 (four) times daily as needed (for muscle pain).         . fluconazole (DIFLUCAN) 150 MG tablet   Oral   Take 1 tablet (150 mg total) by mouth once. If symptoms persist in 72 hours, take second pill   2 tablet   0   . fluticasone (FLONASE) 50 MCG/ACT nasal spray   Nasal   Place 2 sprays into the nose daily as needed for rhinitis or allergies.   16 g   5   . ibuprofen (ADVIL,MOTRIN) 800 MG tablet   Oral   Take 1 tablet (800 mg total) by mouth every 8 (eight) hours as needed for pain.   30 tablet   0   . olopatadine (PATANOL) 0.1 % ophthalmic solution   Both Eyes   Place 1 drop into both eyes 2 (two) times daily.   5 mL   2   . polyethylene glycol powder (GLYCOLAX/MIRALAX) powder   Oral   Take 17 g by mouth 2 (two) times daily.   527 g   1   . topiramate (TOPAMAX) 50 MG tablet   Oral   Take 1 tablet (50 mg total) by mouth 2 (two) times daily as needed (for migraine).   60 tablet   1   . triamterene-hydrochlorothiazide (DYAZIDE) 37.5-25 MG per capsule   Oral   Take 1 each (1 capsule total) by mouth every morning.   30 capsule   5   . zolpidem (AMBIEN) 5 MG tablet   Oral   Take 5 mg by mouth at bedtime as needed. For insomnia.          Marland Kitchen HYDROcodone-acetaminophen (NORCO/VICODIN) 5-325 MG per tablet   Oral   Take 1 tablet by mouth every 4 (four) hours as needed for pain.   15 tablet   0   . ondansetron (ZOFRAN ODT) 8 MG disintegrating tablet   Oral   Take 1 tablet (8 mg total) by mouth every 8 (eight) hours as needed for nausea.   10 tablet   0    BP 117/65  Pulse 69  Temp(Src) 98.7 F (37.1 C) (Oral)  Resp 22  Wt 274 lb 3 oz (124.371 kg)  BMI 51.83 kg/m2  SpO2 100% Physical Exam  Nursing note and vitals reviewed. Constitutional: She is oriented to person, place, and time. She appears well-developed and well-nourished. No distress.  HENT:  Head: Normocephalic and atraumatic.  Eyes: EOM are normal.  Neck: Normal range of motion.  Cardiovascular: Normal rate, regular rhythm and normal heart sounds.   Pulmonary/Chest: Effort normal and breath sounds normal.  Abdominal: Soft. She exhibits no distension.  Mild left lower quadrant tenderness.  No guarding or rebound.  Musculoskeletal: Normal range of motion.  Neurological: She is alert and oriented to person, place, and time.  Skin: Skin is warm and dry.  Psychiatric: She has a normal mood and affect. Judgment normal.    ED Course  Procedures (including critical care time) Labs Review Labs Reviewed  CBC WITH DIFFERENTIAL - Abnormal; Notable for the following:    WBC 17.9 (*)    MCHC 36.5 (*)    Neutrophils Relative % 82 (*)    Neutro Abs 14.6 (*)    All other components within normal limits  COMPREHENSIVE METABOLIC PANEL - Abnormal; Notable for the following:    Potassium 2.8 (*)    Glucose, Bld 114 (*)    Total Protein 8.8 (*)    Alkaline Phosphatase 120 (*)  Total Bilirubin 0.2 (*)    GFR calc non Af Amer 84 (*)    All other components within normal limits  URINALYSIS, ROUTINE W REFLEX MICROSCOPIC  PREGNANCY, URINE   Imaging Review Ct Abdomen Pelvis Wo Contrast  10/04/2013   *RADIOLOGY REPORT*  Clinical Data: Worsening lower  abdominal pain and pelvic. Radiating to legs.  Recent history of pelvic infection.  CT ABDOMEN AND PELVIS WITHOUT CONTRAST  Technique:  Multidetector CT imaging of the abdomen and pelvis was performed following the standard protocol without intravenous contrast.  Comparison: CT of abdomen and pelvis May 10, 2012  Findings: Limited view of the lung bases remain clear.  Included heart and pericardium are unremarkable.  The liver, spleen, pancreas right adrenal glands are unremarkable. 2 cm hypodense (5 HU) benign adrenal adenoma.  Status post cholecystectomy.  Stomach, small and large bowel are normal in course and caliber without wall thickening or inflammatory changes.  No intraperitoneal free fluid or free air.  The kidneys orthotopic, demonstrate normal size, morphology without hydronephrosis, nephrolithiasis.  No definite renal masses as sensitivity may be decreased by lack of intravenous contrast. Great vessels are normal in course and caliber.  Phleboliths in the pelvis.  The urinary bladder is partially distended unremarkable.  Mild prominence of the endometrial cavity which may be secondary to menstrual cycle, similar to prior CT.  Soft tissues are not suspicious.  Moderate bilateral sacroiliac osteoarthrosis. Degenerative change lumbar spine, mild canal stenosis L4-5 and L5 S1 with moderate to severe L5 S1 neural foraminal narrowing.  IMPRESSION: No acute intra-abdominal or pelvic process; stable appearance from May 10, 2012 including 2 cm benign left adrenal adenoma.   Original Report Authenticated By: Awilda Metro    MDM   1. Abdominal pain    Repeat Abdominal exam is benign.  Urine labs without significant abnormality.  CT scan without acute pathology.  Discharge home.  PCP and GI followup.    Lyanne Co, MD 10/04/13 [phone]

## 2013-10-04 NOTE — Telephone Encounter (Signed)
Patient was seen ED and was given and referral to [facility]. She is not able to make an appt until she is referred by [place]. Patient is asking for a referral from [place] to [facility] [phone].

## 2013-10-05 NOTE — Telephone Encounter (Signed)
Okay to refer to GI given intermittent symptoms.  Will be due for screening colonoscopy next month as well.  Please refer to [facility]

## 2013-10-06 ENCOUNTER — Telehealth: Payer: Self-pay | Admitting: Sports Medicine

## 2013-10-06 NOTE — Telephone Encounter (Signed)
Pt called because she was told to call if she still having a discharge. She is. She wanted to know if they were going to call in some medication or does she needs to make an appointment. JW

## 2013-10-06 NOTE — Telephone Encounter (Signed)
I am sorry she is still feeling bad. If the discharge is the same as the yeast infection she had, she can try over the counter yeast medication (such as monistat.) If the discharge has changed, she should come back to see [place].  Thanks, [employer]. Felice Hope, M.D.

## 2013-10-06 NOTE — Telephone Encounter (Signed)
Patient still having symptoms after taking antibiotics, she wants to know if she needs to come back to be seen. Forward to Dr Mikel Cella who saw the patient.Jessica [name], [name]

## 2013-10-06 NOTE — Telephone Encounter (Signed)
Pt states that she has been trying OTC yeast meds for the past few days with no relief.  Will come in tomorrow to be seen. Jazmin Hartsell,CMA

## 2013-10-07 ENCOUNTER — Ambulatory Visit: Payer: Medicare Other | Admitting: Family Medicine

## 2013-10-12 ENCOUNTER — Encounter: Payer: Self-pay | Admitting: Family Medicine

## 2013-10-12 ENCOUNTER — Ambulatory Visit (INDEPENDENT_AMBULATORY_CARE_PROVIDER_SITE_OTHER): Payer: Medicare Other | Admitting: Family Medicine

## 2013-10-12 VITALS — BP 139/79 | HR 86 | Temp 99.1°F | Ht 61.0 in | Wt 275.0 lb

## 2013-10-12 DIAGNOSIS — N760 Acute vaginitis: Secondary | ICD-10-CM

## 2013-10-12 DIAGNOSIS — Z23 Encounter for immunization: Secondary | ICD-10-CM | POA: Diagnosis not present

## 2013-10-12 LAB — POCT WET PREP (WET MOUNT): Clue Cells Wet Prep Whiff POC: NEGATIVE

## 2013-10-12 MED ORDER — CLOTRIMAZOLE 1 % EX CREA: TOPICAL_CREAM | Freq: Two times a day (BID) | CUTANEOUS | Status: DC

## 2013-10-12 NOTE — Assessment & Plan Note (Addendum)
Patient treated with diflucan 2 weeks ago, still reporting vaginal discharge. Not sexually active. Pelvic exam noncontributory. Wet prep repeated today was negative for yeast or BV.  Will prescribe Clotrimazole in cream to use topically for pruritus and f/u as needed.

## 2013-10-12 NOTE — Progress Notes (Signed)
Family Medicine Office Visit Note   Subjective:   Patient ID: Jessica [name], female  DOB: 04-Dec-1961, 52 y.o.. MRN: [id number]   Pt that comes today for same day appointment complaining of vaginal discharge and concern for yeast infection. She reports have been having symptoms for about a month and since yesterday she started to have more pruriginous around her vulvar area. She was seen here 2 weeks ago and treated with Diflucan but her symptoms continued. Denies pelvic pain, fever or chills, nausea or vomiting. She is not sexually active so she's not concern for sexually transmitted diseases.  Review of Systems:  Per HPI  Objective:   Physical Exam: Gen:  NAD Vulva and perianal area: Normal  Speculum: Vagina and cervix of normal appearance, no friability, mild white discharge. Bimanual exam: Uterus anteverted, no adnexal masses. No cervical motion tenderness.  Assessment & Plan:

## 2013-10-18 ENCOUNTER — Telehealth: Payer: Self-pay | Admitting: Sports Medicine

## 2013-10-18 ENCOUNTER — Encounter: Payer: Self-pay | Admitting: Sports Medicine

## 2013-10-18 NOTE — Telephone Encounter (Signed)
Faxed out.Jessica [name]

## 2013-10-18 NOTE — Telephone Encounter (Signed)
Pt called and would like someone to fax a copy of her flu shot to her employer. [phone] attention Rise Mu. JW

## 2013-10-24 ENCOUNTER — Other Ambulatory Visit: Payer: Self-pay | Admitting: Sports Medicine

## 2013-10-27 ENCOUNTER — Telehealth: Payer: Self-pay | Admitting: *Deleted

## 2013-10-27 DIAGNOSIS — B9689 Other specified bacterial agents as the cause of diseases classified elsewhere: Secondary | ICD-10-CM

## 2013-10-27 MED ORDER — METRONIDAZOLE 0.75 % VA GEL: 1.0000 | Freq: Two times a day (BID) | VAGINAL | Status: DC

## 2013-10-27 NOTE — Telephone Encounter (Signed)
Patient is having a bacterial infection again, she has used metrogel in the past and likes it and would like for [place] to call this in for her.  She will follow up if her symptoms change of persist.

## 2013-10-31 ENCOUNTER — Other Ambulatory Visit: Payer: Self-pay | Admitting: Sports Medicine

## 2013-10-31 DIAGNOSIS — I10 Essential (primary) hypertension: Secondary | ICD-10-CM

## 2013-10-31 MED ORDER — TRIAMTERENE-HCTZ 37.5-25 MG PO CAPS: 1.0000 | ORAL_CAPSULE | ORAL | Status: DC

## 2013-10-31 MED ORDER — TOPIRAMATE 50 MG PO TABS: 50.0000 mg | ORAL_TABLET | Freq: Two times a day (BID) | ORAL | Status: DC

## 2013-11-01 DIAGNOSIS — K5900 Constipation, unspecified: Secondary | ICD-10-CM | POA: Diagnosis not present

## 2013-11-01 DIAGNOSIS — R109 Unspecified abdominal pain: Secondary | ICD-10-CM | POA: Diagnosis not present

## 2013-11-07 ENCOUNTER — Other Ambulatory Visit: Payer: Self-pay | Admitting: Sports Medicine

## 2013-11-11 ENCOUNTER — Other Ambulatory Visit: Payer: Self-pay | Admitting: Gastroenterology

## 2013-11-11 DIAGNOSIS — K5730 Diverticulosis of large intestine without perforation or abscess without bleeding: Secondary | ICD-10-CM | POA: Diagnosis not present

## 2013-11-11 DIAGNOSIS — R109 Unspecified abdominal pain: Secondary | ICD-10-CM | POA: Diagnosis not present

## 2013-11-11 DIAGNOSIS — D126 Benign neoplasm of colon, unspecified: Secondary | ICD-10-CM | POA: Diagnosis not present

## 2013-11-22 ENCOUNTER — Ambulatory Visit (INDEPENDENT_AMBULATORY_CARE_PROVIDER_SITE_OTHER): Payer: Medicare Other | Admitting: Sports Medicine

## 2013-11-22 ENCOUNTER — Encounter: Payer: Self-pay | Admitting: Sports Medicine

## 2013-11-22 VITALS — BP 150/80 | HR 91 | Temp 98.0°F | Ht 61.0 in | Wt 271.0 lb

## 2013-11-22 DIAGNOSIS — R7303 Prediabetes: Secondary | ICD-10-CM

## 2013-11-22 DIAGNOSIS — I10 Essential (primary) hypertension: Secondary | ICD-10-CM

## 2013-11-22 DIAGNOSIS — R7309 Other abnormal glucose: Secondary | ICD-10-CM

## 2013-11-22 DIAGNOSIS — I872 Venous insufficiency (chronic) (peripheral): Secondary | ICD-10-CM

## 2013-11-22 DIAGNOSIS — G4733 Obstructive sleep apnea (adult) (pediatric): Secondary | ICD-10-CM

## 2013-11-22 DIAGNOSIS — M23305 Other meniscus derangements, unspecified medial meniscus, unspecified knee: Secondary | ICD-10-CM | POA: Diagnosis not present

## 2013-11-22 DIAGNOSIS — M23303 Other meniscus derangements, unspecified medial meniscus, right knee: Secondary | ICD-10-CM

## 2013-11-22 NOTE — Patient Instructions (Addendum)
   Follow up with Dr. Lajoyce Corners  Try using your compression socks daily.  Put them on 1st thing in the morning  Start Lasix    If you need anything prior to your next visit please call the clinic. Please Bring all medications or accurate medication list with you to each appointment; an accurate medication list is essential in providing you the best care possible.

## 2013-11-22 NOTE — Assessment & Plan Note (Addendum)
Patient reports having compression socks at home she can try wearing again.   > Consider new RX for compression if needed

## 2013-11-22 NOTE — Progress Notes (Signed)
Jessica [name] - 52 y.o. female MRN [id number]  Date of birth: 08/29/1961  CC, HPI, Interval History & ROS  Jessica [name] is here today to followup on her chronic medical conditions including:  Chronic R Knee Pain, lower extremity swelling, hypertension, depression, obesity,  She reports she saw Dr. Lajoyce Corners who gave her a knee injection and recommended if persistent symptoms to consider a total knee replacement she would be a better candidate if she was able to lose significant amount of weight.  The last injection seemed to help her significantly for 6 weeks.  She has had 3 falls in the past 2 months however since the medication seemed to wear off.  She reports her leg will just go out on her while she is walking.  He has had no other injuries due to these falls  Pt denies chest pain, dyspnea at rest or exertion, PND, lower extremity edema.  Patient denies any facial asymmetry, unilateral weakness, or dysarthria.  Is having no side effects to her medications.  Other acute problems include: Pain and swelling in Feet  Pertinent History & Care Coordination  Jessica [name]'s major active medical problems include: # CVD: HTN,  obesity, glucose intolerance, metabolic syndrome, obstructive sleep apnea, - No known prior coronary artery disease or CVA - Does not use a CPAP, due to intolerance - Not a candidate for ACEi or ARB due to angioedema associated with ACEi # GYN: History of menorrhagia, recurrent vaginitis, chronic female pelvic pain, hx of HSV  # Neuro/PSYCH: Depression,  chronic migraines  # MSK: Multiple Sites of significant osteoarthritis.   Congenital pes planus - Currently OA is most bothersome is the right knee; x-ray findings of bicompartment disease  - s/p Right medial meniscus debridement Lajoyce Corners)  Other Pertinent Med/Surg/Hosp History: # History of medication overuse headache, # History of constipation  Follow up Issues:  - Turned 52, due for colonoscopy - Followup CPAP    History    Smoking status  . Never Smoker   Smokeless tobacco  . Never Used   Health Maintenance Due  Topic  . Mammogram   . Colonoscopy    No results found for this basename: HGBA1C, TRIG, CHOL, HDL, LDLCALC, TSH,  in the last 8760 hours   Otherwise past Medical, Surgical, Social, and Family History Reviewed per EMR Medications and Allergies reviewed and all updated if necessary. Objective Findings  VITALS: HR: 91 bpm  BP: 150/80 mmHg  TEMP: 98 F (36.7 C) (Oral)  RESP:    HT: 5\' 1"  (154.9 cm)  WT: 271 lb (122.925 kg)  BMI: 51.3   BP Readings from Last 3 Encounters:  11/22/13 150/80  10/12/13 139/79  10/04/13 110/58   Wt Readings from Last 3 Encounters:  11/22/13 271 lb (122.925 kg)  10/12/13 275 lb (124.739 kg)  10/03/13 274 lb 3 oz (124.371 kg)     PHYSICAL EXAM: GENERAL:  adult morbidly obese African American female. In no discomfort; no respiratory distress  PSYCH: alert and appropriate, good insight   HNEENT:  no JVD   CARDIO: RRR, S1/S2 heard, no murmur  LUNGS: CTA B, no wheezes, no crackles  ABDOMEN:   EXTREM:  Warm, well perfused.  Moves all 4 extremities spontaneously; no lateralization.  Antalgic gait, favoring her right leg. Distal pulses 2+/4.  2+/4 pretibial edema.  Right knee Exam: Appear:  marked obesity with osteoarthritic changes including genu valgus   Palp:  significant crepitation with patellar grind   ROM:  full terminal extension and flexion.  NV:   sensation grossly intact   Testing:  patella not ballotable, no fluid wave  Positive Thessaly     GU:   SKIN:  no significant erythema or skin changes over the right knee  No venous stasis changes     Assessment & Plan   Problems addressed today: General Plan & Pt Instructions:  1. VENOUS INSUFFICIENCY, LEGS   2. Derangement of medial meniscus of right knee       Follow up with Dr. Lajoyce Corners  Try using your compression socks daily.  Put them on 1st thing in the morning  Start Lasix      For  further discussion of A/P and for follow up issues see problem based charting.

## 2013-11-23 ENCOUNTER — Encounter: Payer: Self-pay | Admitting: Sports Medicine

## 2013-11-23 NOTE — Assessment & Plan Note (Signed)
Will have patient return for fasting labs

## 2013-11-23 NOTE — Assessment & Plan Note (Signed)
Appears to be slightly volume overloaded. Limited and medications given ACE inhibitor angioedema Start Lasix for volume control

## 2013-11-23 NOTE — Assessment & Plan Note (Addendum)
Followup if using CPAP  likely complicating treatment of obesity, metabolic syndrome and chronic pain.

## 2013-11-23 NOTE — Assessment & Plan Note (Addendum)
Patient encouraged followup with Dr. Lajoyce Corners if injection today did not help.  She likely is a candidate for a total knee replacement but a partial tricompartmental (medial and patellar) replacement could be considered at a tertiary center.  - Patient may potentially be a candidate for viscosupplementation and I have encouraged her to discuss this with Dr. Lajoyce Corners at follow up although the evidence does not currently support its use    Patient letter to support using tennis shoes while at work as her unsupportive shoes are only likely exacerbating her symptoms.  Right Knee Injection Performed today:  The risks, benefits, and expected outcomes ( including potential of lack of efficacy)   the injection were reviewed and she wishes to undergo the above named procedure.  After an appropriate time out was taken, the Right knee was prepped in a clean fashion and injected from a inferior Medial, bent approach with a 21g syringe using 5cc of 1% plain Lidocaine and 40mg  of Depomedrol. .  A bandaid was applied to the area.  This procedure was well tolerated and there were no complications.  Red flags reviewed.

## 2013-11-26 ENCOUNTER — Other Ambulatory Visit: Payer: Self-pay | Admitting: Family Medicine

## 2013-11-28 ENCOUNTER — Other Ambulatory Visit: Payer: Self-pay | Admitting: Sports Medicine

## 2013-11-28 ENCOUNTER — Other Ambulatory Visit: Payer: Medicare Other

## 2013-11-28 DIAGNOSIS — R7303 Prediabetes: Secondary | ICD-10-CM

## 2013-11-28 DIAGNOSIS — R5381 Other malaise: Secondary | ICD-10-CM

## 2013-11-28 DIAGNOSIS — I10 Essential (primary) hypertension: Secondary | ICD-10-CM | POA: Diagnosis not present

## 2013-11-28 DIAGNOSIS — N920 Excessive and frequent menstruation with regular cycle: Secondary | ICD-10-CM

## 2013-11-28 LAB — COMPREHENSIVE METABOLIC PANEL
ALT: 17 U/L (ref 0–35)
AST: 17 U/L (ref 0–37)
Alkaline Phosphatase: 94 U/L (ref 39–117)
BUN: 10 mg/dL (ref 6–23)
Calcium: 9.4 mg/dL (ref 8.4–10.5)
Chloride: 103 mEq/L (ref 96–112)
Creat: 0.61 mg/dL (ref 0.50–1.10)
Sodium: 138 mEq/L (ref 135–145)
Total Protein: 7.6 g/dL (ref 6.0–8.3)

## 2013-11-28 LAB — CBC
HCT: 38.1 % (ref 36.0–46.0)
MCH: 30.2 pg (ref 26.0–34.0)
MCHC: 34.4 g/dL (ref 30.0–36.0)
MCV: 87.8 fL (ref 78.0–100.0)
RDW: 14 % (ref 11.5–15.5)
WBC: 11.6 10*3/uL — ABNORMAL HIGH (ref 4.0–10.5)

## 2013-11-28 LAB — LIPID PANEL
Cholesterol: 158 mg/dL (ref 0–200)
LDL Cholesterol: 88 mg/dL (ref 0–99)
Triglycerides: 60 mg/dL (ref <150)
VLDL: 12 mg/dL (ref 0–40)

## 2013-11-28 NOTE — Progress Notes (Signed)
CMP,CBC,FLP AND TSH DONE TODAY Jessica [name] 

## 2013-11-29 ENCOUNTER — Telehealth: Payer: Self-pay | Admitting: Sports Medicine

## 2013-11-29 MED ORDER — FUROSEMIDE 20 MG PO TABS: 20.0000 mg | ORAL_TABLET | Freq: Every day | ORAL | Status: DC

## 2013-11-29 NOTE — Telephone Encounter (Signed)
Message copied by Andrena Mews on Tue Nov 29, 2013  1:09 PM ------      Message from: Jennette Bill      Created: Mon Nov 28, 2013  3:39 PM      Regarding: MEDS       Patient says she was told to start Lasix but the Rx was never sent to her pharmacy. Also she thinks she was told to stop some of her medications but she is not sure. Will forward to PCP to clarify.Busick, [name]       ------

## 2013-11-29 NOTE — Telephone Encounter (Signed)
Please advise. Jessica [name] S  

## 2013-11-29 NOTE — Telephone Encounter (Signed)
Pt called because she was given Lasix on 12/2 and she is not sure if she is supposed to continue with her other BP medication. She would like a call back to tell her what she should do. jw

## 2013-11-29 NOTE — Telephone Encounter (Signed)
Per my last telephone note patient is to continue all other medications.  Please have her bring all medications to next appointment.  Please call and inform her

## 2013-11-29 NOTE — Telephone Encounter (Signed)
Rx for lasix sent in.  Erroneously left this all was from last visit. No other changes to her medicines.  Please remind her to bring all medications with her to her next appointment so that we can do a complete medication reconciliation

## 2013-11-30 ENCOUNTER — Encounter: Payer: Self-pay | Admitting: Sports Medicine

## 2013-11-30 ENCOUNTER — Telehealth: Payer: Self-pay

## 2013-11-30 NOTE — Telephone Encounter (Signed)
Patient would like lab results from blood done earlier this week 11/28/13

## 2013-11-30 NOTE — Telephone Encounter (Signed)
Letter sent today. All labs unremarkable.  ASCVD risk of less than 5% so no indication for Statin medication; continue dietary and lifestyle changes.  Please call and inform

## 2013-11-30 NOTE — Telephone Encounter (Signed)
Please advise. Jessica [name]  

## 2013-11-30 NOTE — Telephone Encounter (Signed)
Related message,patient voiced understanding. Jessica [name] S  

## 2013-12-01 NOTE — Telephone Encounter (Signed)
Related message,patient voiced understanding. Jessica [name] S  

## 2013-12-07 ENCOUNTER — Other Ambulatory Visit: Payer: Self-pay | Admitting: Sports Medicine

## 2014-01-04 ENCOUNTER — Telehealth: Payer: Self-pay | Admitting: Sports Medicine

## 2014-01-04 ENCOUNTER — Other Ambulatory Visit: Payer: Self-pay

## 2014-01-04 DIAGNOSIS — Z1231 Encounter for screening mammogram for malignant neoplasm of breast: Secondary | ICD-10-CM

## 2014-01-04 NOTE — Telephone Encounter (Signed)
The laxative she was given is not lasting long enough. The GI dr said to take 2 tabs per day and therefore not lasting the whole month. Please advise

## 2014-01-04 NOTE — Telephone Encounter (Signed)
Pt was instructed to contact the Gi Dr she saw that increased or changed rx of Marilax. Shemia Bevel, Lewie Loron

## 2014-01-15 ENCOUNTER — Other Ambulatory Visit: Payer: Self-pay | Admitting: Obstetrics & Gynecology

## 2014-01-19 ENCOUNTER — Encounter: Payer: Self-pay | Admitting: Family Medicine

## 2014-01-19 ENCOUNTER — Ambulatory Visit (INDEPENDENT_AMBULATORY_CARE_PROVIDER_SITE_OTHER): Payer: Medicare Other | Admitting: Family Medicine

## 2014-01-19 VITALS — BP 136/97 | HR 87 | Temp 97.9°F | Ht 61.0 in | Wt 272.0 lb

## 2014-01-19 DIAGNOSIS — H9202 Otalgia, left ear: Secondary | ICD-10-CM | POA: Insufficient documentation

## 2014-01-19 DIAGNOSIS — M19079 Primary osteoarthritis, unspecified ankle and foot: Secondary | ICD-10-CM | POA: Diagnosis not present

## 2014-01-19 DIAGNOSIS — H9209 Otalgia, unspecified ear: Secondary | ICD-10-CM | POA: Diagnosis not present

## 2014-01-19 DIAGNOSIS — G4700 Insomnia, unspecified: Secondary | ICD-10-CM | POA: Insufficient documentation

## 2014-01-19 MED ORDER — ZOLPIDEM TARTRATE 5 MG PO TABS: 5.0000 mg | ORAL_TABLET | Freq: Every evening | ORAL | Status: DC | PRN

## 2014-01-19 MED ORDER — ANTIPYRINE-BENZOCAINE 5.4-1.4 % OT SOLN: 3.0000 [drp] | OTIC | Status: DC | PRN

## 2014-01-19 MED ORDER — FLUTICASONE PROPIONATE 50 MCG/ACT NA SUSP: 2.0000 | Freq: Every day | NASAL | Status: DC

## 2014-01-19 NOTE — Assessment & Plan Note (Signed)
A month with symptoms and no erythema or signs of acute infection. Unsure if this bulging is secondary to a chronic effusion.  Will treat symptomatically with Auralgan and continue with Flonase now daily.  F/u as needed. Low threshold to start abx.

## 2014-01-19 NOTE — Progress Notes (Signed)
Family Medicine Office Visit Note   Subjective:   Patient ID: Jessica [name], female  DOB: Jan 03, 1961, 53 y.o.. MRN: [id number]   Pt that comes today complaining of left ear pain for about 1 month. She has hx of allergic rhinitis for which she has Flonase prescribed but uses it PRN and has noticed more sensation of congestion recently. This associates to sensation of left ear fullness with intermittent ear pain. Pain is described to be nagging and sharp that radiated to the neck and occipital aspect of head.  Denies chills, fever, nausea, vomiting, general malaise or other symptoms. Also denies swelling, noticed redness or calor in the surrounded area.  Her other issue to discuss today is insomnia: she reports having difficulty falling sleep. She goes to bed at 1:00 am and wakes up at 4:00, with intermittent wakening during that time. She has TV in her room but reports only watches it until 8:30 pm seated in a reclining chair. Pt reports reading her bible before going to sleep and then turning light off. "I cant sleep if my room isn't  pitch black" . She also reports using Trazodone and Ambien in the past with Ambien being the most effective for her. Denies recent stressors or symptoms of depression. Denies suicidal ideation/plan.  Review of Systems:  Pt denies SOB, chest pain, palpitations, dizziness, numbness or weakness.   Objective:   Physical Exam: Gen:  Obese, NAD HEENT: Moist mucous membranes. Neck:supple no adenopathies. Oropharynx: no erythema,no exudates. Ears: right ear canal and TM is normal. Left: no erythema, mild bulging, do not appreciate fluid level. Normal ear canal. CV: Regular rate and rhythm, no murmurs PULM: Clear to auscultation bilaterally. EXT: No edema Neuro: Alert and oriented x3. No focalization [id number] mood and affect. Normal speech. Normal though process. No hallucinations/ delusions. No agitation.  Assessment & Plan:

## 2014-01-19 NOTE — Assessment & Plan Note (Signed)
Pt reports good sleep hygiene. No acute psychiatric disorder identified but due to the length of the visit more in-depth evaluation was not possible. Ambien prescribed and instructed pt to f/u if no improvement, for more detailed phychiatric evaluation. Also Sleep Apnea may also be contributing to this.  F/u with primary doctor is recommended.

## 2014-01-19 NOTE — Patient Instructions (Addendum)
For your ear symptoms, you can use the medications as prescribed. Auralgan and Flonase. If you symptoms do not improve, worsen or other symptoms of fever, chills, nausea, vomiting or other appear you need to get re-evaluated.   For insomnia I have prescribed you Ambien, take is as needed. You need to make a follow up appointment with your doctor if this medication does not help for further work up of underlying conditions that can be contributing to this.

## 2014-01-24 ENCOUNTER — Ambulatory Visit
Admission: RE | Admit: 2014-01-24 | Discharge: 2014-01-24 | Disposition: A | Discharge status: Home / Self Care | Payer: Medicare Other | Source: Ambulatory Visit

## 2014-01-24 DIAGNOSIS — Z1231 Encounter for screening mammogram for malignant neoplasm of breast: Secondary | ICD-10-CM

## 2014-02-03 ENCOUNTER — Other Ambulatory Visit: Payer: Self-pay | Admitting: Sports Medicine

## 2014-02-13 ENCOUNTER — Other Ambulatory Visit: Payer: Self-pay | Admitting: Sports Medicine

## 2014-03-15 ENCOUNTER — Encounter: Payer: Self-pay | Admitting: Sports Medicine

## 2014-03-15 ENCOUNTER — Ambulatory Visit (INDEPENDENT_AMBULATORY_CARE_PROVIDER_SITE_OTHER): Payer: Medicare Other | Admitting: Sports Medicine

## 2014-03-15 VITALS — BP 142/93 | HR 90 | Temp 99.0°F | Ht 61.0 in | Wt 273.0 lb

## 2014-03-15 DIAGNOSIS — G4733 Obstructive sleep apnea (adult) (pediatric): Secondary | ICD-10-CM | POA: Diagnosis not present

## 2014-03-15 DIAGNOSIS — F331 Major depressive disorder, recurrent, moderate: Secondary | ICD-10-CM

## 2014-03-15 DIAGNOSIS — I10 Essential (primary) hypertension: Secondary | ICD-10-CM

## 2014-03-15 DIAGNOSIS — M23305 Other meniscus derangements, unspecified medial meniscus, unspecified knee: Secondary | ICD-10-CM

## 2014-03-15 DIAGNOSIS — M23303 Other meniscus derangements, unspecified medial meniscus, right knee: Secondary | ICD-10-CM

## 2014-03-15 DIAGNOSIS — G500 Trigeminal neuralgia: Secondary | ICD-10-CM | POA: Diagnosis not present

## 2014-03-15 NOTE — Progress Notes (Signed)
Jessica [name] - 53 y.o. female MRN [id number]  Date of birth: Feb 28, 1961  SUBJECTIVE:  Pt is here today with a chief complaint of: left ear and face pain For further subjective including (HPI, Interval History & ROS) please see problem based charting  HISTORY: Jessica [name]'s major active medical problems include: # CVD: HTN,  obesity, glucose intolerance, metabolic syndrome, obstructive sleep apnea, - No known prior coronary artery disease or CVA - Does not use a CPAP, due to intolerance - Not a candidate for ACEi or ARB due to angioedema associated with ACEi # GYN: History of menorrhagia, recurrent vaginitis, chronic female pelvic pain, hx of HSV  # Neuro/PSYCH: Depression,  chronic migraines  # MSK: Multiple Sites of significant osteoarthritis.   Congenital pes planus - Currently OA is most bothersome is the right knee; x-ray findings of bicompartment disease  - s/p Right medial meniscus debridement [name])  Other Pertinent Med/Surg/Hosp History: # History of medication overuse headache, # History of constipation  Follow up Issues:  - Turned 52, due for colonoscopy - Followup CPAP     Recent Labs  11/28/13 0901  TRIG 60  CHOL 158  HDL 58  LDLCALC 88  TSH 1.109  } Wt Readings from Last 3 Encounters:  03/15/14 273 lb (123.832 kg)  01/19/14 272 lb (123.378 kg)  11/22/13 271 lb (122.925 kg)   BP Readings from Last 3 Encounters:  03/15/14 142/93  01/19/14 136/97  11/22/13 150/80    History  Smoking status  . Never Smoker   Smokeless tobacco  . Never Used   No health maintenance topics applied.  Otherwise past Medical, Surgical, Social, and Family History Reviewed per EMR Medications and Allergies reviewed and updated per below.  VITALS: BP 142/93  Pulse 90  Temp(Src) 99 F (37.2 C) (Oral)  Ht 5\' 1"  (1.549 m)  Wt 273 lb (123.832 kg)  BMI 51.61 kg/m2  PHYSICAL EXAM: GENERAL: Adult African American  female. In no discomfort; no respiratory distress  PSYCH:  alert and appropriate, good insight  Expresses frustration around loosing her job due to inability to work secondary to knee pain.  Denies feelings of depression/hopelessness  HNEENT: No JVD CN II-XI intact without motor deficit.  Patient reports decreased sensation along the entirety of the left face to light touch  CARDIO: RRR, S1/S2 heard, no murmur  LUNGS: CTA B, no wheezes, no crackles  ABDOMEN:   EXTREM:  Warm, well perfused.  Moves all 4 extremities spontaneously; no lateralization.  No noted foot lesions.  Distal pulses 2+/4.  Trace pretibial edema; excessive adiposity Right knee Exam: Appear:  chronic arthritic appearance   Palp:  diffusely tender  But no focal midline tenderness  ROM:  loss of terminal flexion and extension   NV:   The bilateral Lower Extremity myotomes are 5+/5.  Sensation is grossly intact to light sensation in bilateral Lower Extremity dermatomes.  Testing:  positive Thessaly, positive McMurray  Negative Lachman's     neuro NEUROLOGIC EXAM: Gross Deficits:  no noted gross   Speech: fluid  Gait: antalgic    SKIN:     MEDICATIONS, LABS & OTHER ORDERS: Previous Medications   AMLODIPINE (NORVASC) 5 MG TABLET    Take 5 mg by mouth daily.    ANTIPYRINE-BENZOCAINE (AURALGAN) OTIC SOLUTION    Place 3-4 drops into the left ear every 2 (two) hours as needed for ear pain.   BUPROPION (WELLBUTRIN XL) 300 MG 24 HR TABLET       CARBAMAZEPINE (  TEGRETOL XR) 100 MG 12 HR TABLET    Take 1 tablet (100 mg total) by mouth 2 (two) times daily.   CETIRIZINE (ZYRTEC) 10 MG TABLET    Take 1 tablet (10 mg total) by mouth daily as needed for allergies.   CLOTRIMAZOLE (LOTRIMIN) 1 % CREAM    Apply topically 2 (two) times daily.   CYCLOBENZAPRINE (FLEXERIL) 10 MG TABLET    Take 1 tablet (10 mg total) by mouth 3 (three) times daily as needed. For muscle spasms.   CYCLOBENZAPRINE (FLEXERIL) 10 MG TABLET    TAKE 1 TABLET BY MOUTH 3 TIMES A DAY AS NEEDED FOR MUSCLE AND BACK SPASMS    DICLOFENAC SODIUM (VOLTAREN) 1 % GEL    Apply 4 g topically 4 (four) times daily as needed (for muscle pain).   FLUCONAZOLE (DIFLUCAN) 150 MG TABLET    Take 1 tablet (150 mg total) by mouth once. If symptoms persist in 72 hours, take second pill   FUROSEMIDE (LASIX) 20 MG TABLET    TAKE 1 TABLET (20 MG TOTAL) BY MOUTH DAILY.   HYDROCODONE-ACETAMINOPHEN (NORCO/VICODIN) 5-325 MG PER TABLET    Take 1 tablet by mouth every 4 (four) hours as needed for pain.   IBUPROFEN (ADVIL,MOTRIN) 800 MG TABLET    TAKE 1 TABLET (800 MG TOTAL) BY MOUTH EVERY 8 (EIGHT) HOURS AS NEEDED FOR PAIN.   METRONIDAZOLE (METROGEL VAGINAL) 0.75 % VAGINAL GEL    Place 1 Applicatorful vaginally 2 (two) times daily.   OLOPATADINE (PATANOL) 0.1 % OPHTHALMIC SOLUTION    Place 1 drop into both eyes 2 (two) times daily.   ONDANSETRON (ZOFRAN ODT) 8 MG DISINTEGRATING TABLET    Take 1 tablet (8 mg total) by mouth every 8 (eight) hours as needed for nausea.   POLYETHYLENE GLYCOL POWDER (GLYCOLAX/MIRALAX) POWDER    TAKE 17 GRAMS BY MOUTH 2 TIMES DAILY   POLYETHYLENE GLYCOL-ELECTROLYTES (NULYTELY/GOLYTELY) 420 G SOLUTION       TOPIRAMATE (TOPAMAX) 50 MG TABLET    Take 1 tablet (50 mg total) by mouth 2 (two) times daily.   TRIAMTERENE-HYDROCHLOROTHIAZIDE (DYAZIDE) 37.5-25 MG PER CAPSULE    Take 1 each (1 capsule total) by mouth every morning.   ZOLPIDEM (AMBIEN) 5 MG TABLET    Take 1 tablet (5 mg total) by mouth at bedtime as needed. For insomnia.   Modified Medications   Modified Medication Previous Medication   FLUTICASONE (FLONASE) 50 MCG/ACT NASAL SPRAY fluticasone (FLONASE) 50 MCG/ACT nasal spray      Place 2 sprays into both nostrils daily.    Place 2 sprays into the nose daily as needed. For congestion.   New Prescriptions   No medications on file   Discontinued Medications   FLUTICASONE (FLONASE) 50 MCG/ACT NASAL SPRAY    Place 2 sprays into both nostrils daily.   Orders Placed This Encounter  Procedures  . Split night study    ASSESSMENT & PLAN: See problem based charting & AVS for pt instructions.

## 2014-03-15 NOTE — Patient Instructions (Signed)
Home exercises per handout Follow up with Dr. [name]  I am sending you for a sleep study

## 2014-03-25 ENCOUNTER — Other Ambulatory Visit: Payer: Self-pay | Admitting: Sports Medicine

## 2014-03-27 NOTE — Assessment & Plan Note (Signed)
Problem Based Documentation:    Subjective Report:  Pt denies chest pain, dyspnea at rest or exertion, PND, lower extremity edema.  Patient denies any facial asymmetry, unilateral weakness, or dysarthria.  Has not taken all her medications recently but does not need refills.     Assessment & Plan & Follow up Issues:  Chronic, poorly controlled condition 1. Patient agreeable to improving compliance with both medications, TLC (Therapeutic Lifestyle Changes) and CPAP > Followup compliance with all the above.  Marland Kitchen

## 2014-03-27 NOTE — Assessment & Plan Note (Signed)
Problem Based Documentation:    Subjective Report:  Patient reports continued disorder sleep with both onset and maintenance  + Daytime somnolence  Continues to snore; no longer has a CPAP that is functioning and does not like to use this machine but would be agreeable to being reevaluated for new one     Assessment & Plan & Follow up Issues:  Chronic, untreated condition - Likely large contribution to a majority of her underlying disorders. 1. Referral for repeat polysomnogram > Will need new CPAP machine if sleep study positive.  Marland Kitchen

## 2014-03-27 NOTE — Assessment & Plan Note (Signed)
Problem Based Documentation:    Subjective Report:  Persistent right-sided knee pain.  Not doing any specific exercises.  Reports significantly limiting functional status.  No clicking, locking or giving way.  Denies significant swelling of the area. Denies falls, fevers, chills, weight loss.  Pt denies any radicular symptoms, change in bowel or bladder habits, muscle weakness or falls associated with back pain.    Not currently interested in injections.     Assessment & Plan & Follow up Issues:  Chronic, ongoing condition 1. Encouraged increasing activity level as tolerated including Aqua Therapy but pt doesn't think she will be able to do these activities 2. Referred back to Dr. [name].  Has previously been told she is not a great candidate for total knee replacement which I agree with however given overall deterioration of functional status encouraged her to rediscuss options with Dr. [name] including:  Repeat cortisone injections although patient is hesitant to use this if not proven to be as effective recently as they were when she initially started receiving injections  Trial of viscosupplementation for potential palliation of OA symptoms; discussed lack of evidence for this at this time the patient is willing to have this conversation  Consideration of potential surgical intervention although I agree with Dr. [name] delaying this until overall medical condition including untreated sleep apnea and obesity are improved. > Followup improvement in functional capacity > Consider referral to [facility] for medial offloading knee sleeve although once again poor literature to support this use

## 2014-03-27 NOTE — Assessment & Plan Note (Signed)
Problem Based Documentation:    Subjective Report:  Patient reports overall mood is well controlled although does seem to be tearful on exam.    She reports recently losing her job due to inability to perform at work she relates to her debility from right-sided knee pain.  Overall patient reports mood is doing "fine"      Assessment & Plan & Follow up Issues:  Chronic, appears to be worsening condition - Patient reporting her mood as well however there are some outward signs of she is struggling.  Concerned that this may be underlying many of her issues including her multiple pain complaints.  This is worsened by her underlying sleep disorder that is currently not being treated. 1. Currently no changes in mood medications however recommend close followup > Consider readdressing multiple no medications and try increasing carbamazepine > NEEDS FULL MED REC; pt encouraged to bring meds with her to next appointment.

## 2014-03-27 NOTE — Assessment & Plan Note (Signed)
Problem Based Documentation:    Subjective Report:  Patient reported persistent left-sided facial numbness but no pain or motor weakness.  Patient denies any facial asymmetry, unilateral weakness, or dysarthria.     Assessment & Plan & Follow up Issues:  Acute relapse of prior condition 1. lack given  of significant/bothersome symptoms no changes in therapy at this time  >   Consider increase of carbamazepine on followup

## 2014-04-17 ENCOUNTER — Encounter (HOSPITAL_BASED_OUTPATIENT_CLINIC_OR_DEPARTMENT_OTHER): Payer: Medicare Other

## 2014-05-08 DIAGNOSIS — M2020 Hallux rigidus, unspecified foot: Secondary | ICD-10-CM | POA: Diagnosis not present

## 2014-05-08 DIAGNOSIS — M1710 Unilateral primary osteoarthritis, unspecified knee: Secondary | ICD-10-CM | POA: Diagnosis not present

## 2014-05-14 ENCOUNTER — Other Ambulatory Visit: Payer: Self-pay | Admitting: Sports Medicine

## 2014-05-15 ENCOUNTER — Ambulatory Visit (HOSPITAL_BASED_OUTPATIENT_CLINIC_OR_DEPARTMENT_OTHER): Payer: Medicare Other | Attending: Family Medicine

## 2014-05-15 VITALS — Ht 60.0 in | Wt 272.0 lb

## 2014-05-15 DIAGNOSIS — G4733 Obstructive sleep apnea (adult) (pediatric): Secondary | ICD-10-CM | POA: Insufficient documentation

## 2014-05-16 ENCOUNTER — Emergency Department (HOSPITAL_BASED_OUTPATIENT_CLINIC_OR_DEPARTMENT_OTHER)
Admission: EM | Admit: 2014-05-16 | Discharge: 2014-05-16 | Disposition: A | Discharge status: Home / Self Care | Payer: Medicare Other | Attending: Emergency Medicine | Admitting: Emergency Medicine

## 2014-05-16 ENCOUNTER — Encounter (HOSPITAL_BASED_OUTPATIENT_CLINIC_OR_DEPARTMENT_OTHER): Payer: Self-pay | Admitting: Emergency Medicine

## 2014-05-16 ENCOUNTER — Emergency Department (HOSPITAL_COMMUNITY)
Admission: EM | Admit: 2014-05-16 | Discharge: 2014-05-16 | Discharge status: Against Medical Advice | Payer: Medicare Other | Source: Home / Self Care

## 2014-05-16 ENCOUNTER — Emergency Department (HOSPITAL_BASED_OUTPATIENT_CLINIC_OR_DEPARTMENT_OTHER): Payer: Medicare Other

## 2014-05-16 ENCOUNTER — Encounter (HOSPITAL_COMMUNITY): Payer: Self-pay | Admitting: Emergency Medicine

## 2014-05-16 ENCOUNTER — Encounter: Payer: Self-pay | Admitting: *Deleted

## 2014-05-16 DIAGNOSIS — I10 Essential (primary) hypertension: Secondary | ICD-10-CM | POA: Insufficient documentation

## 2014-05-16 DIAGNOSIS — Z91040 Latex allergy status: Secondary | ICD-10-CM | POA: Insufficient documentation

## 2014-05-16 DIAGNOSIS — Z79899 Other long term (current) drug therapy: Secondary | ICD-10-CM | POA: Insufficient documentation

## 2014-05-16 DIAGNOSIS — Z8619 Personal history of other infectious and parasitic diseases: Secondary | ICD-10-CM | POA: Insufficient documentation

## 2014-05-16 DIAGNOSIS — R059 Cough, unspecified: Secondary | ICD-10-CM | POA: Insufficient documentation

## 2014-05-16 DIAGNOSIS — F3289 Other specified depressive episodes: Secondary | ICD-10-CM | POA: Diagnosis not present

## 2014-05-16 DIAGNOSIS — Z8742 Personal history of other diseases of the female genital tract: Secondary | ICD-10-CM | POA: Diagnosis not present

## 2014-05-16 DIAGNOSIS — E669 Obesity, unspecified: Secondary | ICD-10-CM | POA: Diagnosis not present

## 2014-05-16 DIAGNOSIS — M19079 Primary osteoarthritis, unspecified ankle and foot: Secondary | ICD-10-CM | POA: Diagnosis not present

## 2014-05-16 DIAGNOSIS — M79609 Pain in unspecified limb: Secondary | ICD-10-CM | POA: Insufficient documentation

## 2014-05-16 DIAGNOSIS — Z792 Long term (current) use of antibiotics: Secondary | ICD-10-CM | POA: Insufficient documentation

## 2014-05-16 DIAGNOSIS — F329 Major depressive disorder, single episode, unspecified: Secondary | ICD-10-CM | POA: Insufficient documentation

## 2014-05-16 DIAGNOSIS — G4733 Obstructive sleep apnea (adult) (pediatric): Secondary | ICD-10-CM | POA: Diagnosis not present

## 2014-05-16 DIAGNOSIS — Z8719 Personal history of other diseases of the digestive system: Secondary | ICD-10-CM | POA: Insufficient documentation

## 2014-05-16 DIAGNOSIS — IMO0002 Reserved for concepts with insufficient information to code with codable children: Secondary | ICD-10-CM | POA: Diagnosis not present

## 2014-05-16 DIAGNOSIS — R0602 Shortness of breath: Secondary | ICD-10-CM | POA: Insufficient documentation

## 2014-05-16 DIAGNOSIS — J984 Other disorders of lung: Secondary | ICD-10-CM | POA: Diagnosis not present

## 2014-05-16 DIAGNOSIS — R072 Precordial pain: Secondary | ICD-10-CM | POA: Insufficient documentation

## 2014-05-16 DIAGNOSIS — G43909 Migraine, unspecified, not intractable, without status migrainosus: Secondary | ICD-10-CM | POA: Insufficient documentation

## 2014-05-16 DIAGNOSIS — R079 Chest pain, unspecified: Secondary | ICD-10-CM

## 2014-05-16 DIAGNOSIS — R05 Cough: Secondary | ICD-10-CM | POA: Insufficient documentation

## 2014-05-16 LAB — CBC WITH DIFFERENTIAL/PLATELET
BASOS ABS: 0 10*3/uL (ref 0.0–0.1)
Basophils Relative: 0 % (ref 0–1)
Eosinophils Absolute: 0.1 10*3/uL (ref 0.0–0.7)
Eosinophils Relative: 1 % (ref 0–5)
HEMATOCRIT: 39.2 % (ref 36.0–46.0)
HEMOGLOBIN: 13.6 g/dL (ref 12.0–15.0)
LYMPHS PCT: 37 % (ref 12–46)
Lymphs Abs: 3.4 10*3/uL (ref 0.7–4.0)
MCH: 31.6 pg (ref 26.0–34.0)
MCHC: 34.7 g/dL (ref 30.0–36.0)
MCV: 91.2 fL (ref 78.0–100.0)
MONO ABS: 0.5 10*3/uL (ref 0.1–1.0)
MONOS PCT: 6 % (ref 3–12)
Neutro Abs: 5.1 10*3/uL (ref 1.7–7.7)
Neutrophils Relative %: 56 % (ref 43–77)
Platelets: 360 10*3/uL (ref 150–400)
RBC: 4.3 MIL/uL (ref 3.87–5.11)
RDW: 13.8 % (ref 11.5–15.5)
WBC: 9.2 10*3/uL (ref 4.0–10.5)

## 2014-05-16 LAB — BASIC METABOLIC PANEL
BUN: 12 mg/dL (ref 6–23)
CHLORIDE: 98 meq/L (ref 96–112)
CO2: 25 meq/L (ref 19–32)
CREATININE: 0.7 mg/dL (ref 0.50–1.10)
Calcium: 9.7 mg/dL (ref 8.4–10.5)
GFR calc Af Amer: >90 mL/min (ref >90)
GFR calc non Af Amer: >90 mL/min (ref >90)
GLUCOSE: 98 mg/dL (ref 70–99)
Potassium: 3.3 mEq/L — ABNORMAL LOW (ref 3.7–5.3)
Sodium: 138 mEq/L (ref 137–147)

## 2014-05-16 LAB — PRO B NATRIURETIC PEPTIDE: Pro B Natriuretic peptide (BNP): 26.9 pg/mL (ref 0–125)

## 2014-05-16 LAB — TROPONIN I

## 2014-05-16 MED ORDER — ASPIRIN 81 MG PO CHEW
324.0000 mg | CHEWABLE_TABLET | Freq: Once | ORAL | Status: AC
  Administered 2014-05-16: 324 mg via ORAL
  Filled 2014-05-16: qty 4

## 2014-05-16 NOTE — ED Notes (Signed)
Pt states she had sleep study last night.  Pt having intermittent left sided chest pain which radiates down her left arm.  Two different episodes, pt states she feels tightness currently.  No sob.  Some dizziness.

## 2014-05-16 NOTE — ED Notes (Signed)
Encouraged pt to stay but she did not want to wait and states she will go to her dr office

## 2014-05-16 NOTE — ED Provider Notes (Signed)
CSN: [id number]     Arrival date & time 05/16/14  1440 History   First MD Initiated Contact with Patient 05/16/14 1542     Chief Complaint  Patient presents with  . Chest Pain     (Consider location/radiation/quality/duration/timing/severity/associated sxs/prior Treatment) HPI Comments: Pt is a 53 y.o. female with Pmhx as above who presents with 4 episodes of brief CP, two around 7am were L sided sharp, lasting seconds w/ assoc L arm pain, mild SOB and then two episodes this afternoon described as pressure, also lasting seconds. No CP currently. For past 1 months has had DOE and cannot sleep flat. No fever chills. She has hx of chronic, non-productive cough in mornings.    Patient is a 53 y.o. female presenting with chest pain. The history is provided by the patient. No language interpreter was used.  Chest Pain Pain location:  L lateral chest and substernal area Pain quality: pressure and sharp   Radiates to: two episodes around 7am radiated to L arm. Pain radiates to the back: no   Pain severity:  Moderate Onset quality:  Sudden Duration: seconds. Timing:  Intermittent Progression:  Resolved Chronicity:  New Context: at rest   Relieved by:  Nothing Worsened by:  Nothing tried Ineffective treatments:  None tried Associated symptoms: cough (chronic, in mornings only) and shortness of breath   Associated symptoms: no abdominal pain, no back pain, no diaphoresis, no fatigue, no fever, no headache, no nausea, no numbness, no palpitations, no PND, not vomiting and no weakness   Shortness of breath:    Severity:  Mild   Onset quality:  Sudden   Duration: seconds this morning.   Progression:  Resolved Risk factors: hypertension and obesity   Risk factors: no aortic disease, no coronary artery disease, no diabetes mellitus, no high cholesterol and no prior DVT/PE     Past Medical History  Diagnosis Date  . Hypertension   . Obesity   . HSV (herpes simplex virus) infection   .  Headache(784.0)   . Depression      on meds  . BV (bacterial vaginosis)     recent dx - finish med 12/22/11  . Insomnia     on meds  . Sleep apnea     occas. uses CPAP  . Anal fissure     history  . Arthritis     feet  . Migraines   . Angioedema of lips 03/21/2011    Presumed 2nd to ACE    Past Surgical History  Procedure Laterality Date  . Neck surgery  2001    cervical fusion C4/5  . Cesarean section      x 3  . Tubal ligation  1992  . Cholecystectomy    . Endometrial ablation    . Knee surgery  oct 2013    minesctomy and debridement   Family History  Problem Relation Age of Onset  . Diabetes Mother   . Diabetes Father   . Diabetes Sister   . Cancer Paternal Aunt     cevical  . Cancer Paternal Aunt     cervical   History  Substance Use Topics  . Smoking status: Never Smoker   . Smokeless tobacco: Never Used  . Alcohol Use: No     Comment: socially   OB History   Grav Para Term Preterm Abortions TAB SAB Ect Mult Living   3 3 3       3      Review of Systems  Constitutional: Negative for fever, chills, diaphoresis, activity change, appetite change and fatigue.  HENT: Negative for congestion, facial swelling, rhinorrhea and sore throat.   Eyes: Negative for photophobia and discharge.  Respiratory: Positive for cough (chronic, in mornings only) and shortness of breath. Negative for chest tightness.   Cardiovascular: Positive for chest pain. Negative for palpitations, leg swelling and PND.  Gastrointestinal: Negative for nausea, vomiting, abdominal pain and diarrhea.  Endocrine: Negative for polydipsia and polyuria.  Genitourinary: Negative for dysuria, frequency, difficulty urinating and pelvic pain.  Musculoskeletal: Negative for arthralgias, back pain, neck pain and neck stiffness.  Skin: Negative for color change and wound.  Allergic/Immunologic: Negative for immunocompromised state.  Neurological: Negative for facial asymmetry, weakness, numbness and  headaches.  Hematological: Does not bruise/bleed easily.  Psychiatric/Behavioral: Negative for confusion and agitation.      Allergies  Ace inhibitors; Angiotensin receptor blockers; and Latex  Home Medications   Prior to Admission medications   Medication Sig Start Date End Date Taking? Authorizing Provider  amLODipine (NORVASC) 5 MG tablet TAKE 1 TABLET EVERY DAY    Gerda Diss, DO  antipyrine-benzocaine [facility]) otic solution Place 3-4 drops into the left ear every 2 (two) hours as needed for ear pain. 01/19/14   Dayarmys Piloto de Gwendalyn Ege, MD  buPROPion (WELLBUTRIN XL) 300 MG 24 hr tablet  10/19/13   Historical Provider, MD  carbamazepine (TEGRETOL XR) 100 MG 12 hr tablet Take 1 tablet (100 mg total) by mouth 2 (two) times daily. 07/22/13   Gerda Diss, DO  cetirizine (ZYRTEC) 10 MG tablet Take 1 tablet (10 mg total) by mouth daily as needed for allergies. 07/22/13   Gerda Diss, DO  clotrimazole (LOTRIMIN) 1 % cream Apply topically 2 (two) times daily. 10/12/13   Dayarmys Piloto de Gwendalyn Ege, MD  cyclobenzaprine (FLEXERIL) 10 MG tablet Take 1 tablet (10 mg total) by mouth 3 (three) times daily as needed. For muscle spasms. 08/02/13   Gerda Diss, DO  cyclobenzaprine (FLEXERIL) 10 MG tablet TAKE 1 TABLET BY MOUTH 3 TIMES A DAY AS NEEDED FOR MUSCLE AND BACK SPASMS 01/15/14   Osborne Oman, MD  diclofenac sodium (VOLTAREN) 1 % GEL Apply 4 g topically 4 (four) times daily as needed (for muscle pain).    Historical Provider, MD  fluconazole (DIFLUCAN) 150 MG tablet Take 1 tablet (150 mg total) by mouth once. If symptoms persist in 72 hours, take second pill 09/30/13   Montez Morita, MD  fluticasone (FLONASE) 50 MCG/ACT nasal spray Place 2 sprays into both nostrils daily.    Gerda Diss, DO  furosemide (LASIX) 20 MG tablet TAKE 1 TABLET (20 MG TOTAL) BY MOUTH DAILY.    Gerda Diss, DO  HYDROcodone-acetaminophen (NORCO/VICODIN) 5-325 MG per tablet Take 1 tablet by mouth  every 4 (four) hours as needed for pain. 10/04/13   Hoy Morn, MD  ibuprofen (ADVIL,MOTRIN) 800 MG tablet TAKE 1 TABLET (800 MG TOTAL) BY MOUTH EVERY 8 (EIGHT) HOURS AS NEEDED FOR PAIN. 11/26/13   Gerda Diss, DO  metroNIDAZOLE (METROGEL VAGINAL) 0.75 % vaginal gel Place 1 Applicatorful vaginally 2 (two) times daily. 10/27/13   Osborne Oman, MD  olopatadine (PATANOL) 0.1 % ophthalmic solution Place 1 drop into both eyes 2 (two) times daily. 07/22/13   Gerda Diss, DO  ondansetron (ZOFRAN ODT) 8 MG disintegrating tablet Take 1 tablet (8 mg total) by mouth every 8 (eight) hours as needed for nausea.  10/04/13   Hoy Morn, MD  polyethylene glycol powder (GLYCOLAX/MIRALAX) powder MIX AND TAKE 17 GRAMS BY MOUTH 2 TIMES DAILY    Gerda Diss, DO  polyethylene glycol-electrolytes (NULYTELY/GOLYTELY) 420 G solution  11/01/13   Historical Provider, MD  topiramate (TOPAMAX) 50 MG tablet Take 1 tablet (50 mg total) by mouth 2 (two) times daily. 10/31/13   Gerda Diss, DO  triamterene-hydrochlorothiazide (DYAZIDE) 37.5-25 MG per capsule TAKE 1 EACH (1 CAPSULE TOTAL) BY MOUTH EVERY MORNING.    Gerda Diss, DO  zolpidem (AMBIEN) 5 MG tablet Take 1 tablet (5 mg total) by mouth at bedtime as needed. For insomnia. 01/19/14   Dayarmys Piloto de Gwendalyn Ege, MD   BP 157/90  Pulse 77  Temp(Src) 98.3 F (36.8 C) (Oral)  Resp 16  Ht 5\' 1"  (1.549 m)  Wt 270 lb (122.471 kg)  BMI 51.04 kg/m2  SpO2 99% Physical Exam  Constitutional: She is oriented to person, place, and time. She appears well-developed and well-nourished. No distress.  HENT:  Head: Normocephalic and atraumatic.  Mouth/Throat: No oropharyngeal exudate.  Eyes: Pupils are equal, round, and reactive to light.  Neck: Normal range of motion. Neck supple.  Cardiovascular: Normal rate, regular rhythm and normal heart sounds.  Exam reveals no gallop and no friction rub.   No murmur heard. Pulmonary/Chest: Effort normal and breath  sounds normal. No respiratory distress. She has no wheezes. She has no rales.  Abdominal: Soft. Bowel sounds are normal. She exhibits no distension and no mass. There is no tenderness. There is no rebound and no guarding.  Musculoskeletal: Normal range of motion. She exhibits edema (1+ BLLE edema). She exhibits no tenderness.  Neurological: She is alert and oriented to person, place, and time.  Skin: Skin is warm and dry.  Psychiatric: She has a normal mood and affect.    ED Course  Procedures (including critical care time) Labs Review Labs Reviewed  BASIC METABOLIC PANEL - Abnormal; Notable for the following:    Potassium 3.3 (*)    All other components within normal limits  CBC WITH DIFFERENTIAL  TROPONIN I  PRO B NATRIURETIC PEPTIDE    Imaging Review Dg Chest 2 View  05/16/2014   CLINICAL DATA:  Chest tightness since this morning.  EXAM: CHEST  2 VIEW  COMPARISON:  Chest x-ray of June 16, 2013  FINDINGS: The lungs are adequately inflated. There is no focal infiltrate. There is subtle increased density at projects over the posterior aspect of the right fifth rib. This may reflect the anterior aspect of the right third rib overlapping the fifth rib, but subtle nodularity is not absolutely excluded. No definite abnormality is demonstrated on the lateral film. The cardiopericardial silhouette is normal in size. The pulmonary vascularity is not engorged. There is no pleural effusion.  IMPRESSION: Subtle increased density in the right midlung demonstrated on the frontal film only may reflect a confluence of densities, but one cannot absolutely exclude a nodule or small infiltrate.   Electronically Signed   By: David  Martinique   On: 05/16/2014 15:47     EKG Interpretation   Date/Time:  Tuesday May 16 2014 14:52:20 EDT Ventricular Rate:  84 PR Interval:  164 QRS Duration: 84 QT Interval:  376 QTC Calculation: 444 R Axis:   58 Text Interpretation:  Normal sinus rhythm Cannot rule out  Anterior infarct  , age undetermined Abnormal ECG T wave inversions III, aVF, V1-V4 No  significant change since last tracing  Confirmed by Tonny Isensee  MD, Kyle  [phone]) on 05/16/2014 3:05:38 PM      MDM   Final diagnoses:  Chest pain    Pt is a 53 y.o. female with Pmhx as above who presents with 4 episodes of brief CP, two around 7am were L sided sharp, lasting seconds w/ assoc L arm pain, mild SOB and then two episodes this afternoon described as pressure, also lasting seconds. No CP currently. For past 1 months has had DOE and cannot sleep flat. No fever chills. She has hx of chronic, non-productive cough in mornings.  On PE, VSS, pt in NAD. cardiopulm exam benign. Minimila BLLE edema. EKG w/ unchanged TWI inferior and precordial leads.  Trop negative. CBC nml BMP w/ K 3.3 (replaced), CXR w/ density in R mid lung only seen on PA view. I do not feel clinically this represents pna. BNP nml.  Symptoms atypical for ACS, HEART score 3 (low risk for MACE). Will d/c home with Return precautions given for new or worsening symptoms including return or worsening CP. She will call PCP office tomorrow to discuss setting up outpt stress testing.          Neta Ehlers, MD 05/16/14 [phone]

## 2014-05-16 NOTE — Discharge Instructions (Signed)
Chest Pain (Nonspecific) °It is often hard to give a specific diagnosis for the cause of chest pain. There is always a chance that your pain could be related to something serious, such as a heart attack or a blood clot in the lungs. You need to follow up with your caregiver for further evaluation. °CAUSES  °· Heartburn. °· Pneumonia or bronchitis. °· Anxiety or stress. °· Inflammation around your heart (pericarditis) or lung (pleuritis or pleurisy). °· A blood clot in the lung. °· A collapsed lung (pneumothorax). It can develop suddenly on its own (spontaneous pneumothorax) or from injury (trauma) to the chest. °· Shingles infection (herpes zoster virus). °The chest wall is composed of bones, muscles, and cartilage. Any of these can be the source of the pain. °· The bones can be bruised by injury. °· The muscles or cartilage can be strained by coughing or overwork. °· The cartilage can be affected by inflammation and become sore (costochondritis). °DIAGNOSIS  °Lab tests or other studies, such as X-rays, electrocardiography, stress testing, or cardiac imaging, may be needed to find the cause of your pain.  °TREATMENT  °· Treatment depends on what may be causing your chest pain. Treatment may include: °· Acid blockers for heartburn. °· Anti-inflammatory medicine. °· Pain medicine for inflammatory conditions. °· Antibiotics if an infection is present. °· You may be advised to change lifestyle habits. This includes stopping smoking and avoiding alcohol, caffeine, and chocolate. °· You may be advised to keep your head raised (elevated) when sleeping. This reduces the chance of acid going backward from your stomach into your esophagus. °· Most of the time, nonspecific chest pain will improve within 2 to 3 days with rest and mild pain medicine. °HOME CARE INSTRUCTIONS  °· If antibiotics were prescribed, take your antibiotics as directed. Finish them even if you start to feel better. °· For the next few days, avoid physical  activities that bring on chest pain. Continue physical activities as directed. °· Do not smoke. °· Avoid drinking alcohol. °· Only take over-the-counter or prescription medicine for pain, discomfort, or fever as directed by your caregiver. °· Follow your caregiver's suggestions for further testing if your chest pain does not go away. °· Keep any follow-up appointments you made. If you do not go to an appointment, you could develop lasting (chronic) problems with pain. If there is any problem keeping an appointment, you must call to reschedule. °SEEK MEDICAL CARE IF:  °· You think you are having problems from the medicine you are taking. Read your medicine instructions carefully. °· Your chest pain does not go away, even after treatment. °· You develop a rash with blisters on your chest. °SEEK IMMEDIATE MEDICAL CARE IF:  °· You have increased chest pain or pain that spreads to your arm, neck, jaw, back, or abdomen. °· You develop shortness of breath, an increasing cough, or you are coughing up blood. °· You have severe back or abdominal pain, feel nauseous, or vomit. °· You develop severe weakness, fainting, or chills. °· You have a fever. °THIS IS AN EMERGENCY. Do not wait to see if the pain will go away. Get medical help at once. Call your local emergency services (911 in [place] Do not drive yourself to the hospital. °MAKE SURE YOU:  °· Understand these instructions. °· Will watch your condition. °· Will get help right away if you are not doing well or get worse. °Document Released: 09/24/2005 Document Revised: 03/08/2012 Document Reviewed: 07/20/2008 °Exit[employer]® Patient Information ©2014 [employer]   [employer]. ° °

## 2014-05-16 NOTE — ED Notes (Signed)
States "i had 2 twinges of sharp shooting chest pain from my chest down my l arm this morning while i was getting ready for work." she denies pain now. She is alert and breathing easily

## 2014-05-16 NOTE — Progress Notes (Signed)
Pt walked into clinic and spoke with triage nurse about having sharp pain Left chest and left arm.  Pt stated she was at [facility] ED and waited for about a hour before they would see her.  Pt stated when they brought her back to exam room they took her blood pressure, but didn't tell her the reading, placed monitoring pads on her chest and sent her back to lobby area.  Pt stated they were very rude to her and she didn't want to return to the ED.  Dr. Erin Hearing spoke with pt and advised pt that she was in the right place (ED) to be evaluated.  Providers here at [facility] were all with pt's at this time and unable to evaluate her and advised her to go back to ED.  Pt stated understanding, but was not going back to [facility] ED, she was going to [facility] ED.  Pt denied any blurred vision, dizziness, SOB or headache.    Derl Barrow, RN

## 2014-05-17 ENCOUNTER — Telehealth: Payer: Self-pay | Admitting: Sports Medicine

## 2014-05-17 DIAGNOSIS — G4733 Obstructive sleep apnea (adult) (pediatric): Secondary | ICD-10-CM

## 2014-05-17 DIAGNOSIS — R079 Chest pain, unspecified: Secondary | ICD-10-CM

## 2014-05-17 DIAGNOSIS — I10 Essential (primary) hypertension: Secondary | ICD-10-CM

## 2014-05-17 NOTE — Telephone Encounter (Signed)
Pt was in ED yesterday for chest pain. She was told to call today to get a stress test scheduled She would also like a referral to a dermatologist because the sun is putting dark spots on her face Please advise

## 2014-05-18 NOTE — Telephone Encounter (Signed)
Refer to cardiology and placed.  Pt needs appointment to address skin concerns given this has never been evaluated.  Please have pt schedule follow up appointment with me after she sees cardiology or sooner if she would like to discuss her skin concerns further.

## 2014-05-19 NOTE — Telephone Encounter (Signed)
Informed patient and she is coming in on 5/27 for the skin issue

## 2014-05-20 DIAGNOSIS — G4700 Insomnia, unspecified: Secondary | ICD-10-CM | POA: Diagnosis not present

## 2014-05-20 DIAGNOSIS — G4733 Obstructive sleep apnea (adult) (pediatric): Secondary | ICD-10-CM

## 2014-05-20 DIAGNOSIS — G4730 Sleep apnea, unspecified: Secondary | ICD-10-CM

## 2014-05-20 NOTE — Sleep Study (Signed)
   NAME: Jessica [name] DATE OF BIRTH:  1961/11/05 MEDICAL RECORD NUMBER [id number]  LOCATION: [facility]  PHYSICIAN: Analise Glotfelty D Dylan Monforte  DATE OF STUDY: 05/15/2014  SLEEP STUDY TYPE: Nocturnal Polysomnogram               REFERRING PHYSICIAN: Gerda Diss, DO  INDICATION FOR STUDY: Insomnia with sleep apnea  EPWORTH SLEEPINESS SCORE:   4/24 HEIGHT: 5' (152.4 cm)  WEIGHT: 272 lb (123.378 kg)    Body mass index is 53.12 kg/(m^2).  NECK SIZE: 15 in.  MEDICATIONS: Charted for review  SLEEP ARCHITECTURE: Total sleep time 371 minutes with sleep efficiency 93.1%. Stage I was 2%, stage II 78.8%, stage III 0.1%, REM 19% of total sleep time. Sleep latency 10 minutes, REM latency 76.5 minutes, awake after sleep onset 17.5 minutes, arousal index 1, bedtime medication: None  RESPIRATORY DATA: Apnea hypopneas index (AHI) 10 per hour. 62 total events scored including 29 obstructive apneas, 2 central apneas, 31 hypopneas. Events were seen all sleep positions. REM AHI 41.7 per hour. There were not enough early events to meet protocol requirement for split CPAP titration. Most events were associated with REM sleep.  OXYGEN DATA: Loud snoring with oxygen desaturation to a nadir of 80% and mean oxygen saturation through the study of 94% on room air.  CARDIAC DATA: Sinus rhythm  MOVEMENT/PARASOMNIA: No significant movement disturbance, bathroom x2  IMPRESSION/ RECOMMENDATION:   1) Mild obstructive sleep apnea/hypopnea syndrome, AHI 10 per hour with non-positional events. REM AHI 41.7 per hour, with most apneas associated with REM. Loud snoring with oxygen desaturation to a nadir of 80% and mean oxygen saturation through the study of 94% on room air. 2) There were not enough early events to meet protocol requirement for split CPAP titration on the study. If clinically appropriate, the patient can return for dedicated CPAP titration study. 3) A previous polysomnogram on 05/04/2007  recorded AHI 22.2 per hour with body weight for that study 309 pounds.  Signed Baird Lyons M.D. [place], Tax adviser of Sleep Medicine  ELECTRONICALLY SIGNED ON:  05/20/2014, 2:48 PM [place] PH: [phone]   FX: [phone] [place]

## 2014-05-24 ENCOUNTER — Encounter: Payer: Self-pay | Admitting: Sports Medicine

## 2014-05-24 ENCOUNTER — Ambulatory Visit (INDEPENDENT_AMBULATORY_CARE_PROVIDER_SITE_OTHER): Payer: Medicare Other | Admitting: Sports Medicine

## 2014-05-24 ENCOUNTER — Telehealth: Payer: Self-pay | Admitting: Sports Medicine

## 2014-05-24 VITALS — BP 128/76 | HR 71 | Temp 97.7°F | Ht 61.0 in | Wt 268.0 lb

## 2014-05-24 DIAGNOSIS — F331 Major depressive disorder, recurrent, moderate: Secondary | ICD-10-CM

## 2014-05-24 DIAGNOSIS — M23305 Other meniscus derangements, unspecified medial meniscus, unspecified knee: Secondary | ICD-10-CM

## 2014-05-24 DIAGNOSIS — M23303 Other meniscus derangements, unspecified medial meniscus, right knee: Secondary | ICD-10-CM

## 2014-05-24 DIAGNOSIS — L811 Chloasma: Secondary | ICD-10-CM | POA: Insufficient documentation

## 2014-05-24 DIAGNOSIS — L819 Disorder of pigmentation, unspecified: Secondary | ICD-10-CM | POA: Diagnosis not present

## 2014-05-24 DIAGNOSIS — R079 Chest pain, unspecified: Secondary | ICD-10-CM | POA: Diagnosis not present

## 2014-05-24 DIAGNOSIS — G4733 Obstructive sleep apnea (adult) (pediatric): Secondary | ICD-10-CM

## 2014-05-24 HISTORY — DX: Chloasma: L81.1

## 2014-05-24 MED ORDER — BACLOFEN 10 MG PO TABS: 10.0000 mg | ORAL_TABLET | Freq: Three times a day (TID) | ORAL | Status: DC

## 2014-05-24 MED ORDER — BUPROPION HCL ER (XL) 300 MG PO TB24: 300.0000 mg | ORAL_TABLET | Freq: Every day | ORAL | Status: DC

## 2014-05-24 MED ORDER — TRETINOIN 0.025 % EX GEL: Freq: Every day | CUTANEOUS | Status: DC

## 2014-05-24 MED ORDER — FLUTICASONE PROPIONATE 50 MCG/ACT NA SUSP: 2.0000 | Freq: Every day | NASAL | Status: DC

## 2014-05-24 MED ORDER — FUROSEMIDE 20 MG PO TABS: 20.0000 mg | ORAL_TABLET | Freq: Every day | ORAL | Status: DC

## 2014-05-24 MED ORDER — AMLODIPINE BESYLATE 5 MG PO TABS: 5.0000 mg | ORAL_TABLET | Freq: Every day | ORAL | Status: DC

## 2014-05-24 MED ORDER — TRETINOIN 0.025 % EX CREA: TOPICAL_CREAM | Freq: Every day | CUTANEOUS | Status: DC

## 2014-05-24 MED ORDER — TOPIRAMATE 50 MG PO TABS: 50.0000 mg | ORAL_TABLET | Freq: Two times a day (BID) | ORAL | Status: DC

## 2014-05-24 MED ORDER — TRIAMTERENE-HCTZ 37.5-25 MG PO CAPS: 1.0000 | ORAL_CAPSULE | Freq: Every day | ORAL | Status: DC

## 2014-05-24 NOTE — Assessment & Plan Note (Signed)
Problem Based Documentation:    Subjective Report:  Patient reports overall her mood continues to be depressed and she frequent visits her daughter in [place] to help "get away from everything"  Seem to be doing better when on Wellbutrin and would like a refill but has been off for approximately one month     Assessment & Plan & Follow up Issues:  Chronic, undertreated condition  - multifactorial 1. Refill Wellbutrin

## 2014-05-24 NOTE — Patient Instructions (Signed)
Try the new face cream each night.  Use a high SPF sunscreen daily.  You should hear from Cardiology regarding seeing them for a stress test.  New Rx for Welbutrin

## 2014-05-24 NOTE — Telephone Encounter (Signed)
Insurance doesn't cover the cream medication Is there something insurance will cover>
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
## 2014-05-24 NOTE — Assessment & Plan Note (Signed)
Problem Based Documentation:    Subjective Report:  Patient has had multiple episodes of midsternal chest pain which radiates to her left arm.  She describes this as a sharp shooting pain that lasts only seconds will occasionally take her breath away.  No diaphoresis.  Typically related to upper extremity use.  Evaluated in the emergency department with  low risk HEART  score and recommended to followup with outpatient cardiology for stress testing.  Patient does elicit a 3-4 months decreased functional status and worsening stamina including when walking up steps and across a parking lot.     Assessment & Plan & Follow up Issues:  acute, recurrent condition  -  no further episodes since last evaluation in emergency department.  Noncardiac chest pain although given risk factors of hypertension, obesity with decreased functional status over the past 3-4 months warrents ischemic workup.  Referral has previously been placed 1. follow up with cardiology for ischemic evaluation    > Consider changing blood pressure regimen although patient has been extremely hesitant to do so in the past

## 2014-05-24 NOTE — Assessment & Plan Note (Signed)
Chronic condition  - unable to pick up muscle relaxer provided by Dr. [name] 1. Trial of baclofen

## 2014-05-24 NOTE — Assessment & Plan Note (Signed)
Problem Based Documentation:    Subjective Report:  Patient underwent sleep study however was unable to have CPAP titrated.  Sleep study did show significant apneic episodes during REM sleep.     Assessment & Plan & Follow up Issues:  Chronic, untreated condition  - patient needs CPAP therapy it would likely benefit from a self titrating machine given prior intolerance and variable requirements during different stages of sleep. 1. Order for self titrating CPAP machine.    > Is unable to obtained will need repeat sleep study for CPAP titration

## 2014-05-24 NOTE — Telephone Encounter (Signed)
I have sent in a prescription for the GEL.  Both of these should be covered under Medicaid preferred drug list

## 2014-05-24 NOTE — Assessment & Plan Note (Signed)
Chronic condition  - exacerbated following sun exposure on a cruise.  Primarily for a distribution 1. Patient education provided. 2. Trial of Retin-A and covered by insurance Followup consider referral to dermatology improve this is causing significant distress

## 2014-05-25 NOTE — Telephone Encounter (Signed)
Spoke with patient and informed her of below.Jessica [name]

## 2014-05-26 ENCOUNTER — Telehealth: Payer: Self-pay | Admitting: Sports Medicine

## 2014-05-26 NOTE — Telephone Encounter (Signed)
Pharmacy never received the rx that was e-scribed to [facility] for the Tretinoin (Retin-A) 0.025 % gel.  Please phone in to pharmacy per patient.

## 2014-05-29 NOTE — Addendum Note (Signed)
Addended by: Teresa Coombs D on: 05/29/2014 09:18 AM   Modules accepted: Orders

## 2014-05-29 NOTE — Progress Notes (Signed)
Patient is reportedly unable to get any topical retinoid.  Due to this will refer to dermatology for further evaluation and management of melasma.  Patient will likely need to go to [facility] due to lack of Medicare/Medicaid providers in [place]

## 2014-05-29 NOTE — Telephone Encounter (Signed)
Per Dr Paulla Fore patient was informed that Dermatology  Referral was placed.she voiced great appreciation.[place]

## 2014-06-12 ENCOUNTER — Telehealth: Payer: Self-pay | Admitting: *Deleted

## 2014-06-12 NOTE — Telephone Encounter (Signed)
Please inform patient of appt at [facility] Monday 07-03-2014 @11 :30am.  I mailed her an appt card with this information on it.  Along with their number in case this time or date doesn't work for her. Jazmin Hartsell,CMA

## 2014-06-20 ENCOUNTER — Encounter: Payer: Self-pay | Admitting: Obstetrics & Gynecology

## 2014-06-20 ENCOUNTER — Other Ambulatory Visit (HOSPITAL_COMMUNITY)
Admission: RE | Admit: 2014-06-20 | Discharge: 2014-06-20 | Disposition: A | Discharge status: Home / Self Care | Payer: Medicare Other | Source: Ambulatory Visit | Attending: Obstetrics & Gynecology | Admitting: Obstetrics & Gynecology

## 2014-06-20 ENCOUNTER — Ambulatory Visit (INDEPENDENT_AMBULATORY_CARE_PROVIDER_SITE_OTHER): Payer: Medicare Other | Admitting: Obstetrics & Gynecology

## 2014-06-20 VITALS — BP 123/77 | HR 72 | Ht 61.0 in | Wt 267.0 lb

## 2014-06-20 DIAGNOSIS — Z124 Encounter for screening for malignant neoplasm of cervix: Secondary | ICD-10-CM | POA: Insufficient documentation

## 2014-06-20 DIAGNOSIS — Z0000 Encounter for general adult medical examination without abnormal findings: Secondary | ICD-10-CM | POA: Diagnosis not present

## 2014-06-20 DIAGNOSIS — Z1151 Encounter for screening for human papillomavirus (HPV): Secondary | ICD-10-CM | POA: Insufficient documentation

## 2014-06-20 DIAGNOSIS — Z113 Encounter for screening for infections with a predominantly sexual mode of transmission: Secondary | ICD-10-CM | POA: Insufficient documentation

## 2014-06-20 DIAGNOSIS — N760 Acute vaginitis: Secondary | ICD-10-CM | POA: Insufficient documentation

## 2014-06-20 DIAGNOSIS — R102 Pelvic and perineal pain: Secondary | ICD-10-CM

## 2014-06-20 DIAGNOSIS — Z01419 Encounter for gynecological examination (general) (routine) without abnormal findings: Secondary | ICD-10-CM

## 2014-06-20 DIAGNOSIS — N949 Unspecified condition associated with female genital organs and menstrual cycle: Secondary | ICD-10-CM

## 2014-06-20 NOTE — Progress Notes (Signed)
    GYNECOLOGY CLINIC ANNUAL PREVENTATIVE CARE ENCOUNTER NOTE  Subjective:     Jessica [name] is a 53 y.o. [id number] female here for a routine annual gynecologic exam.  Current complaints: vaginal discomfort following intercourse two weeks ago. She says that during the intercourse she remained very dry.  Patient is concerned that she gets recurrent vaginitis episodes, was recently treated for yeast infection.   Gynecologic History No LMP recorded. Patient has had an ablation. Last Pap:12/07/12. Results were: Normal pap smear and negative HRHPV. Last mammogram: 01/25/14. Results were: normal Colonoscopy 10/2013: Normal as per patient report  Obstetric History OB History  Gravida Para Term Preterm AB SAB TAB Ectopic Multiple Living  3 3 3       3     # Outcome Date GA Lbr Len/2nd Weight Sex Delivery Anes PTL Lv  3 TRM           2 TRM           1 TRM              The following portions of the patient's history were reviewed and updated as appropriate: allergies, current medications, past family history, past medical history, past social history, past surgical history and problem list.  Review of Systems A comprehensive review of systems was positive for hot flashes occasionally.   Objective:   BP 123/77  Pulse 72  Ht 5\' 1"  (1.549 m)  Wt 267 lb (121.11 kg)  BMI 50.48 kg/m2 GENERAL: Well-developed, obese female in no acute distress.  HEENT: Normocephalic, atraumatic. Sclerae anicteric.  NECK: Supple. Normal thyroid.  LUNGS: Clear to auscultation bilaterally.  HEART: Regular rate and rhythm.  BREASTS: Large, symmetric in size. No masses, skin changes, nipple drainage, or lymphadenopathy.  ABDOMEN: Soft, obese, nontender, nondistended. No organomegaly palpated. PELVIC: Normal external female genitalia. Vagina is pink and well-rugated. Thick white discharge noted. No erythema or other lesions. Normal cervix contour. Pap smear obtained. Unable to palpate uterus or adnexa secondary to  habitus  EXTREMITIES: No cyanosis, clubbing, or edema, 2+ distal pulses.   Assessment:   Annual gynecologic examination Vaginal pain/dyspareunia Recurrent vaginitis   Plan:   Pap and ancillary testing for vaginitis pathogens done, will follow up results and manage accordingly No evidence of vulvovaginal atrophy on exam; recommended use of water-based lubrication during intercourse   Proper vulvar hygiene emphasized: discussed avoidance of perfumed soaps, detergents, lotions and any type of douches; in addition to wearing cotton underwear and no underwear at night.  Patient given samples of Replens to attempt to reestablish correct vaginal pH balance. Will monitor her response to these recommended interventions. Routine preventative health maintenance measures emphasized   Verita Schneiders, MD, [place] Attending [place] for [facility]

## 2014-06-20 NOTE — Progress Notes (Signed)
Patient is having pelvic discomfort following intercourse two weeks ago.  She says that during the intercourse she remained very dry.  The pain in across the bottom of her pelvic area.

## 2014-06-20 NOTE — Patient Instructions (Signed)
Thank you for enrolling in [facility]. Please follow the instructions below to securely access your online medical record. MyChart allows you to send messages to your doctor, view your test results, manage appointments, and more.   How Do I Sign Up? 1. In your Internet browser, go to [employer] and enter [email or web address]. 2. Click on the Sign Up Now link in the Sign In box. You will see the New Member Sign Up page. 3. Enter your MyChart Access Code exactly as it appears below. You will not need to use this code after you've completed the sign-up process. If you do not sign up before the expiration date, you must request a new code.  MyChart Access Code: [email or web address] Expires: 07/15/2014  4:57 PM  4. Enter your Social Security Number ([id number]) and Date of Birth (mm/dd/yyyy) as indicated and click Submit. You will be taken to the next sign-up page. 5. Create a MyChart ID. This will be your MyChart login ID and cannot be changed, so think of one that is secure and easy to remember. 6. Create a MyChart password. You can change your password at any time. 7. Enter your Password Reset Question and Answer. This can be used at a later time if you forget your password.  8. Enter your e-mail address. You will receive e-mail notification when new information is available in [place]. 9. Click Sign Up. You can now view your medical record.   Additional Information Remember, MyChart is NOT to be used for urgent needs. For medical emergencies, dial 911.

## 2014-06-21 ENCOUNTER — Other Ambulatory Visit: Payer: Self-pay | Admitting: Sports Medicine

## 2014-06-21 LAB — CYTOLOGY - PAP

## 2014-06-23 ENCOUNTER — Telehealth: Payer: Self-pay | Admitting: *Deleted

## 2014-06-23 DIAGNOSIS — B9689 Other specified bacterial agents as the cause of diseases classified elsewhere: Secondary | ICD-10-CM

## 2014-06-23 DIAGNOSIS — N760 Acute vaginitis: Principal | ICD-10-CM

## 2014-06-23 MED ORDER — METRONIDAZOLE 500 MG PO TABS: 500.0000 mg | ORAL_TABLET | Freq: Two times a day (BID) | ORAL | Status: DC

## 2014-06-23 NOTE — Telephone Encounter (Signed)
Patient continues to have increased irritating discharge and would like to have something called in for her as her test results from her visit are inconclusive and advised to recollect.

## 2014-06-26 ENCOUNTER — Encounter: Payer: Self-pay | Admitting: Internal Medicine

## 2014-06-26 ENCOUNTER — Ambulatory Visit (INDEPENDENT_AMBULATORY_CARE_PROVIDER_SITE_OTHER): Payer: Medicare Other | Admitting: Internal Medicine

## 2014-06-26 VITALS — BP 128/80 | HR 66 | Ht 61.0 in | Wt 269.1 lb

## 2014-06-26 DIAGNOSIS — I10 Essential (primary) hypertension: Secondary | ICD-10-CM | POA: Diagnosis not present

## 2014-06-26 DIAGNOSIS — R9431 Abnormal electrocardiogram [ECG] [EKG]: Secondary | ICD-10-CM | POA: Diagnosis not present

## 2014-06-26 DIAGNOSIS — R079 Chest pain, unspecified: Secondary | ICD-10-CM | POA: Diagnosis not present

## 2014-06-26 DIAGNOSIS — N644 Mastodynia: Secondary | ICD-10-CM | POA: Insufficient documentation

## 2014-06-26 DIAGNOSIS — G4733 Obstructive sleep apnea (adult) (pediatric): Secondary | ICD-10-CM

## 2014-06-26 NOTE — Progress Notes (Addendum)
OFFICE NOTE  Chief Complaint:  Chest pain, left arm pain  Primary Care Physician: Teresa Coombs, DO  HPI:  Jessica [name] is a pleasant 53 year old female who is presenting for the evaluation of chest pain. She does have risk factors for cardiac disease including hypertension, morbid obesity, obstructive sleep apnea now on CPAP. She recently started on CPAP and reports a marked improvement in her energy level as well as better sleep at night. She's recently been having some chest pain. She reported a few weeks ago that she had left chest pain while in the shower and when she got out she was drying her hair with her load prior left hand and brush in her right. As she was holding up her left arm she reported pain in her left arm that radiated down to her fingers which included numbness and tingling. There is some mild left chest discomfort. There is also some pain in her back by her shoulder blade. The symptoms resolved quickly but came back and then she presented to the emergency room. She did rule out for MI. She does report that she gets short of breath and has some left chest/arm heaviness with exertion mostly during walking and other activities which improves with rest. An EKG in the office today does demonstrate inferior and anterolateral T wave inversions.  PMHx:  Past Medical History  Diagnosis Date  . Hypertension   . Obesity   . HSV (herpes simplex virus) infection   . Headache(784.0)   . Depression      on meds  . BV (bacterial vaginosis)     recent dx - finish med 12/22/11  . Insomnia     on meds  . Sleep apnea     occas. uses CPAP  . Anal fissure     history  . Arthritis     feet  . Migraines   . Angioedema of lips 03/21/2011    Presumed 2nd to ACE     Past Surgical History  Procedure Laterality Date  . Neck surgery  2001    cervical fusion C4/5  . Cesarean section      x 3  . Tubal ligation  1992  . Cholecystectomy    . Endometrial ablation    . Knee  surgery  oct 2013    minesctomy and debridement    FAMHx:  Family History  Problem Relation Age of Onset  . Diabetes Mother   . Diabetes Father   . Diabetes Sister   . Cancer Paternal Aunt     cevical  . Cancer Paternal Aunt     cervical  . Heart attack Brother 78  . Heart failure Mother   . Hypertension Mother   . Stroke Mother     SOCHx:   reports that she has never smoked. She has never used smokeless tobacco. She reports that she does not drink alcohol or use illicit drugs.  ALLERGIES:  Allergies  Allergen Reactions  . Ace Inhibitors     Lip swelling with lisinopril  . Angiotensin Receptor Blockers     Lip swelling with ACE  . Latex Itching and Rash    ROS: A comprehensive review of systems was negative except for: Respiratory: positive for dyspnea on exertion Cardiovascular: positive for chest pain Musculoskeletal: positive for arm pain  HOME MEDS: Current Outpatient Prescriptions  Medication Sig Dispense Refill  . amLODipine (NORVASC) 5 MG tablet Take 1 tablet (5 mg total) by mouth daily.  90 tablet  3  . baclofen (LIORESAL) 10 MG tablet TAKE 1 TABLET (10 MG TOTAL) BY MOUTH 3 (THREE) TIMES DAILY.  90 tablet  0  . buPROPion (WELLBUTRIN XL) 300 MG 24 hr tablet Take 1 tablet (300 mg total) by mouth daily.  30 tablet  1  . cetirizine (ZYRTEC) 10 MG tablet Take 1 tablet (10 mg total) by mouth daily as needed for allergies.  30 tablet  5  . fluticasone (FLONASE) 50 MCG/ACT nasal spray Place 2 sprays into both nostrils daily.  16 g  3  . furosemide (LASIX) 20 MG tablet Take 1 tablet (20 mg total) by mouth daily.  90 tablet  1  . metroNIDAZOLE (FLAGYL) 500 MG tablet Take 1 tablet (500 mg total) by mouth 2 (two) times daily.  14 tablet  0  . olopatadine (PATANOL) 0.1 % ophthalmic solution Place 1 drop into both eyes 2 (two) times daily.  5 mL  2  . polyethylene glycol powder (GLYCOLAX/MIRALAX) powder MIX AND TAKE 17 GRAMS BY MOUTH 2 TIMES DAILY  527 g  1  . topiramate  (TOPAMAX) 50 MG tablet Take 1 tablet (50 mg total) by mouth 2 (two) times daily.  180 tablet  1  . tretinoin (RETIN-A) 0.025 % gel Apply topically at bedtime.  45 g  0  . triamterene-hydrochlorothiazide (DYAZIDE) 37.5-25 MG per capsule Take 1 each (1 capsule total) by mouth daily.  90 capsule  1   No current facility-administered medications for this visit.    LABS/IMAGING: No results found for this or any previous visit (from the past 48 hour(s)). No results found.  VITALS: BP 128/80  Pulse 66  Ht 5\' 1"  (1.549 m)  Wt 269 lb 1.6 oz (122.063 kg)  BMI 50.87 kg/m2  EXAM: General appearance: alert and no distress Neck: no carotid bruit and no JVD Lungs: clear to auscultation bilaterally Heart: regular rate and rhythm, S1, S2 normal, no murmur, click, rub or gallop Abdomen: soft, non-tender; bowel sounds normal; no masses,  no organomegaly Extremities: extremities normal, atraumatic, no cyanosis or edema Pulses: 2+ and symmetric Skin: Skin color, texture, turgor normal. No rashes or lesions Neurologic: Grossly normal PSych: Mood, affect normal  EKG: Normal sinus rhythm at 66, inferior and anterolateral T wave inversions concerning for ischemia  ASSESSMENT: 1. Left chest and arm pain with exertion 2. Abnormal EKG with ischemic changes 3. Morbid obesity 4. Hypertension 5. Obstructive sleep apnea on CPAP 6. Strong family history of premature coronary disease  PLAN: 30.   Jessica [name] has a number of cardiac risk factors and has had some progressive symptoms concerning for cardiac ischemia. Her EKG is abnormal and suggestive of ischemia. I would recommend a repeat exercise nuclear perfusion test. Plan to see her back to discuss results of that study.  Thank you very much for referring her to me for consultation.  Pixie Casino, MD, [facility] Attending Cardiologist [facility]  HILTY,Kenneth C 06/26/2014, 1:10 PM

## 2014-06-26 NOTE — Patient Instructions (Signed)
Your physician has requested that you have en exercise stress myoview. For further information please visit [email or web address]. Please follow instruction sheet, as given.  Your physician recommends that you schedule a follow-up appointment in: after the stress test with Dr. Debara Pickett.

## 2014-06-27 ENCOUNTER — Other Ambulatory Visit: Payer: Self-pay | Admitting: *Deleted

## 2014-06-27 DIAGNOSIS — R079 Chest pain, unspecified: Secondary | ICD-10-CM

## 2014-07-04 ENCOUNTER — Telehealth (HOSPITAL_COMMUNITY): Payer: Self-pay

## 2014-07-04 NOTE — Telephone Encounter (Signed)
Encounter complete. 

## 2014-07-05 ENCOUNTER — Telehealth (HOSPITAL_COMMUNITY): Payer: Self-pay

## 2014-07-05 ENCOUNTER — Other Ambulatory Visit: Payer: Self-pay | Admitting: *Deleted

## 2014-07-05 DIAGNOSIS — B001 Herpesviral vesicular dermatitis: Secondary | ICD-10-CM

## 2014-07-05 NOTE — Telephone Encounter (Signed)
Encounter complete. 

## 2014-07-06 ENCOUNTER — Ambulatory Visit (HOSPITAL_COMMUNITY)
Admission: RE | Admit: 2014-07-06 | Discharge: 2014-07-06 | Disposition: A | Discharge status: Home / Self Care | Payer: Medicare Other | Source: Ambulatory Visit | Attending: Internal Medicine | Admitting: Internal Medicine

## 2014-07-06 DIAGNOSIS — R079 Chest pain, unspecified: Secondary | ICD-10-CM | POA: Diagnosis not present

## 2014-07-06 HISTORY — PX: CARDIOVASCULAR STRESS TEST: SHX262

## 2014-07-06 MED ORDER — TECHNETIUM TC 99M SESTAMIBI GENERIC - CARDIOLITE
10.9000 | Freq: Once | INTRAVENOUS | Status: AC | PRN
  Administered 2014-07-06: 10.9 via INTRAVENOUS

## 2014-07-06 MED ORDER — VALACYCLOVIR HCL 500 MG PO TABS: 500.0000 mg | ORAL_TABLET | Freq: Every day | ORAL | Status: DC

## 2014-07-06 MED ORDER — TECHNETIUM TC 99M SESTAMIBI GENERIC - CARDIOLITE
29.9000 | Freq: Once | INTRAVENOUS | Status: AC | PRN
  Administered 2014-07-06: 29.9 via INTRAVENOUS

## 2014-07-06 NOTE — Procedures (Addendum)
[facility] [address] [place] [address] [phone]  Cardiology Nuclear Med Study  KARLISHA MATHENA is a 53 y.o. female     MRN : [id number]     DOB: 23-Aug-1961  Procedure Date: 07/06/2014  Nuclear Med Background Indication for Stress Test:  Abnormal EKG History:  No prior cardiac or respiratory history reported; No prior NUC MPI for comparison. Cardiac Risk Factors: Family History - CAD, Hypertension, Obesity and venous insuf.;  Symptoms:  Chest Pain and DOE   Nuclear Pre-Procedure Caffeine/Decaff Intake:  7:00pm NPO After: 5:00am   IV Site: R Forearm  IV 0.9% NS with Angio Cath:  22g  Chest Size (in):  n/a IV Started by: Rolene Course, RN  Height: 5\' 1"  (1.549 m)  Cup Size: D  BMI:  Body mass index is 50.85 kg/(m^2). Weight:  269 lb (122.018 kg)   Tech Comments:  n/a    Nuclear Med Study 1 or 2 day study: 1 day  Stress Test Type:  Stress  Order Authorizing Provider:  Lyman Bishop, MD   Resting Radionuclide: Technetium 47m Sestamibi  Resting Radionuclide Dose: 10.9 mCi   Stress Radionuclide:  Technetium 43m Sestamibi  Stress Radionuclide Dose: 29.9 mCi           Stress Protocol Rest HR: 68 Stress HR: 151  Rest BP:117/94 Stress BP:162/94  Exercise Time (min): 6:11 METS: 7.00   Predicted Max HR: 170 bpm % Max HR: 88.82 bpm Rate Pressure Product: 24462  Dose of Adenosine (mg):  n/a Dose of Lexiscan: n/a mg  Dose of Atropine (mg): n/a Dose of Dobutamine: n/a mcg/kg/min (at max HR)  Stress Test Technologist: Mellody Memos, CCT Nuclear Technologist: Imagene Riches, CNMT   Rest Procedure:  Myocardial perfusion imaging was performed at rest 45 minutes following the intravenous administration of Technetium 44m Sestamibi. Stress Procedure:  The patient performed treadmill exercise using a Bruce  Protocol for 6 minutes 11 seconds. The patient stopped due to fatigue and shortness of breath. Patient denied any chest  pain.  There were no significant ST-T wave changes.  Technetium 88m Sestamibi was injected IV at peak exercise and myocardial perfusion imaging was performed after a brief delay.  Transient Ischemic Dilatation (Normal <1.22):  1.10  QGS EDV:  74 ml QGS ESV:  24 ml LV Ejection Fraction: 68%  Rest ECG: NSR - Normal EKG  Stress ECG: No significant change from baseline ECG  QPS Raw Data Images:  Normal; no motion artifact; normal heart/lung ratio. Stress Images:  There is decreased uptake in the apex. Rest Images:  There is decreased uptake in the apex. Subtraction (SDS):  No evidence of ischemia.  Impression Exercise Capacity:  Good exercise capacity. BP Response:  Normal blood pressure response. Clinical Symptoms:  No significant symptoms noted. ECG Impression:  No significant ST segment change suggestive of ischemia. Comparison with Prior Nuclear Study: No previous nuclear study performed  Overall Impression:  Low risk stress nuclear study with fixed apical attenuation artifact. No ischemia.  LV Wall Motion:  NL LV Function; NL Wall Motion; EF 68%  Pixie Casino, MD, [facility] Board Certified in Nuclear Cardiology Attending Cardiologist [place], MD  07/06/2014 1:27 PM

## 2014-07-07 ENCOUNTER — Telehealth: Payer: Self-pay | Admitting: Internal Medicine

## 2014-07-07 NOTE — Telephone Encounter (Signed)
Routed to [place]

## 2014-07-07 NOTE — Telephone Encounter (Signed)
Patient is returning a call to [place]

## 2014-07-07 NOTE — Progress Notes (Signed)
LMTCB regarding test results

## 2014-07-07 NOTE — Telephone Encounter (Signed)
Patient notified of normal stress test.  

## 2014-07-12 ENCOUNTER — Other Ambulatory Visit: Payer: Self-pay | Admitting: Sports Medicine

## 2014-07-18 DIAGNOSIS — L819 Disorder of pigmentation, unspecified: Secondary | ICD-10-CM | POA: Diagnosis not present

## 2014-07-18 DIAGNOSIS — L708 Other acne: Secondary | ICD-10-CM | POA: Diagnosis not present

## 2014-07-24 ENCOUNTER — Ambulatory Visit (INDEPENDENT_AMBULATORY_CARE_PROVIDER_SITE_OTHER): Payer: Medicare Other | Admitting: Family Medicine

## 2014-07-24 ENCOUNTER — Encounter: Payer: Self-pay | Admitting: Family Medicine

## 2014-07-24 VITALS — BP 117/69 | HR 73 | Temp 98.2°F | Ht 61.0 in | Wt 269.0 lb

## 2014-07-24 DIAGNOSIS — L299 Pruritus, unspecified: Secondary | ICD-10-CM

## 2014-07-24 DIAGNOSIS — I10 Essential (primary) hypertension: Secondary | ICD-10-CM

## 2014-07-24 DIAGNOSIS — J309 Allergic rhinitis, unspecified: Secondary | ICD-10-CM

## 2014-07-24 DIAGNOSIS — G4733 Obstructive sleep apnea (adult) (pediatric): Secondary | ICD-10-CM

## 2014-07-24 MED ORDER — HYDROXYZINE HCL 10 MG PO TABS: 10.0000 mg | ORAL_TABLET | Freq: Three times a day (TID) | ORAL | Status: DC | PRN

## 2014-07-24 MED ORDER — TRIAMCINOLONE ACETONIDE 55 MCG/ACT NA AERO: 2.0000 | INHALATION_SPRAY | Freq: Every day | NASAL | Status: DC

## 2014-07-24 MED ORDER — MONTELUKAST SODIUM 10 MG PO TABS: 10.0000 mg | ORAL_TABLET | Freq: Every day | ORAL | Status: DC

## 2014-07-24 NOTE — Progress Notes (Signed)
   Subjective:    Patient ID: Jessica [name], female    DOB: 09-Jan-1961, 53 y.o.   MRN: [id number]  HPI  Patient presents for an office visit  Sleep apnea: currently uses CPAP machine. Has been sleeping much better. No night time awakenings. No daytime somnolence.  Allergies: Patient with sneezing, runny nose, itchy eyes. Takes Claritin and Flonase.  Itching: Patient with whole body itching and feeling of crawling. Has occurred for one month. Has not had dry skin and uses Lubriderm for moisturizer. Intermittent and not worse at any particular part of day. Has not tried anything for the itching  Hypertension: Patient currently on amlodipine and Dyazide. Adherent to regimen. No headaches, chest pain or shortness of breath.  Review of Systems Per HPI    Objective:   Physical Exam  Constitutional: She appears well-developed and well-nourished.  HENT:  Right Ear: External ear normal.  Left Ear: External ear normal.  Nose: Mucosal edema present.  Mouth/Throat: Uvula is midline, oropharynx is clear and moist and mucous membranes are normal.  Eyes: Conjunctivae and EOM are normal. Pupils are equal, round, and reactive to light.  Cardiovascular: Normal rate and regular rhythm.   Pulmonary/Chest: Effort normal and breath sounds normal.          Assessment & Plan:

## 2014-07-24 NOTE — Patient Instructions (Addendum)
Jessica [name], it was a pleasure seeing you today. Today we talked about your sleep apnea, allergies, itching and high blood pressure.   Sleep Apnea: continue using CPAP  Allergies: I am prescribing Singulair and changing your Flonase to Nasacort to see if you like that better.  Itching: I will prescribe Hydroxyzine to see if that helps your itching  Hypertension: your blood pressure today was 117/69. This is great. No changes to your medications. Keep up your efforts for losing weight.  Please make an appointment to see me in 6 months.  If you have any questions or concerns, please do not hesitate to call the office at [phone].  Sincerely,  Cordelia Poche, MD

## 2014-07-26 ENCOUNTER — Ambulatory Visit (INDEPENDENT_AMBULATORY_CARE_PROVIDER_SITE_OTHER): Payer: Medicare Other | Admitting: Internal Medicine

## 2014-07-26 ENCOUNTER — Encounter: Payer: Self-pay | Admitting: Internal Medicine

## 2014-07-26 VITALS — BP 136/90 | HR 73 | Ht 61.0 in | Wt 270.5 lb

## 2014-07-26 DIAGNOSIS — G4733 Obstructive sleep apnea (adult) (pediatric): Secondary | ICD-10-CM | POA: Diagnosis not present

## 2014-07-26 DIAGNOSIS — R079 Chest pain, unspecified: Secondary | ICD-10-CM | POA: Diagnosis not present

## 2014-07-26 DIAGNOSIS — I10 Essential (primary) hypertension: Secondary | ICD-10-CM

## 2014-07-26 NOTE — Patient Instructions (Signed)
Your physician recommends that you schedule a follow-up appointment as needed  

## 2014-07-26 NOTE — Progress Notes (Signed)
OFFICE NOTE  Chief Complaint:  Chest pain, left arm pain  Primary Care Physician: Cordelia Poche, MD  HPI:  Jessica [name] is a pleasant 53 year old female who is presenting for the evaluation of chest pain. She does have risk factors for cardiac disease including hypertension, morbid obesity, obstructive sleep apnea now on CPAP. She recently started on CPAP and reports a marked improvement in her energy level as well as better sleep at night. She's recently been having some chest pain. She reported a few weeks ago that she had left chest pain while in the shower and when she got out she was drying her hair with her load prior left hand and brush in her right. As she was holding up her left arm she reported pain in her left arm that radiated down to her fingers which included numbness and tingling. There is some mild left chest discomfort. There is also some pain in her back by her shoulder blade. The symptoms resolved quickly but came back and then she presented to the emergency room. She did rule out for MI. She does report that she gets short of breath and has some left chest/arm heaviness with exertion mostly during walking and other activities which improves with rest. An EKG in the office today does demonstrate inferior and anterolateral T wave inversions.  Mrs. Markin returns today for followup. She underwent a nuclear stress test which was negative for ischemia an EF of 66%. She reports only one episode of some sharp chest discomfort which she noted when she was pushing a shopping cart. Otherwise she's had no further chest pain symptoms.  PMHx:  Past Medical History  Diagnosis Date  . Hypertension   . Obesity   . HSV (herpes simplex virus) infection   . Headache(784.0)   . Depression      on meds  . BV (bacterial vaginosis)     recent dx - finish med 12/22/11  . Insomnia     on meds  . Sleep apnea     occas. uses CPAP  . Anal fissure     history  . Arthritis     feet  .  Migraines   . Angioedema of lips 03/21/2011    Presumed 2nd to ACE     Past Surgical History  Procedure Laterality Date  . Neck surgery  2001    cervical fusion C4/5  . Cesarean section      x 3  . Tubal ligation  1992  . Cholecystectomy    . Endometrial ablation    . Knee surgery  oct 2013    minesctomy and debridement    FAMHx:  Family History  Problem Relation Age of Onset  . Diabetes Mother   . Diabetes Father   . Diabetes Sister   . Cancer Paternal Aunt     cevical  . Cancer Paternal Aunt     cervical  . Heart attack Brother 38  . Heart failure Mother   . Hypertension Mother   . Stroke Mother     SOCHx:   reports that she has never smoked. She has never used smokeless tobacco. She reports that she does not drink alcohol or use illicit drugs.  ALLERGIES:  Allergies  Allergen Reactions  . Ace Inhibitors     Lip swelling with lisinopril  . Angiotensin Receptor Blockers     Lip swelling with ACE  . Latex Itching and Rash    ROS: A comprehensive review of systems  was negative except for: Respiratory: positive for dyspnea on exertion Cardiovascular: positive for chest pain Musculoskeletal: positive for arm pain  HOME MEDS: Current Outpatient Prescriptions  Medication Sig Dispense Refill  . amLODipine (NORVASC) 5 MG tablet Take 1 tablet (5 mg total) by mouth daily.  90 tablet  3  . cetirizine (ZYRTEC) 10 MG tablet Take 1 tablet (10 mg total) by mouth daily as needed for allergies.  30 tablet  5  . furosemide (LASIX) 20 MG tablet Take 1 tablet (20 mg total) by mouth daily.  90 tablet  1  . hydrOXYzine (ATARAX/VISTARIL) 10 MG tablet Take 1 tablet (10 mg total) by mouth 3 (three) times daily as needed.  30 tablet  0  . montelukast (SINGULAIR) 10 MG tablet Take 1 tablet (10 mg total) by mouth at bedtime.  30 tablet  5  . polyethylene glycol powder (GLYCOLAX/MIRALAX) powder MIX AND TAKE 17 GRAMS BY MOUTH 2 TIMES DAILY  527 g  1  . topiramate (TOPAMAX) 50 MG tablet  Take 1 tablet (50 mg total) by mouth 2 (two) times daily.  180 tablet  1  . tretinoin (RETIN-A) 0.025 % gel Apply topically at bedtime.  45 g  0  . triamcinolone (NASACORT AQ) 55 MCG/ACT AERO nasal inhaler Place 2 sprays into the nose daily.  1 Inhaler  0  . triamterene-hydrochlorothiazide (DYAZIDE) 37.5-25 MG per capsule Take 1 each (1 capsule total) by mouth daily.  90 capsule  1  . valACYclovir (VALTREX) 500 MG tablet Take 1 tablet (500 mg total) by mouth daily.  30 tablet  3   No current facility-administered medications for this visit.    LABS/IMAGING: No results found for this or any previous visit (from the past 48 hour(s)). No results found.  VITALS: BP 136/90  Pulse 73  Ht 5\' 1"  (1.549 m)  Wt 270 lb 8 oz (122.698 kg)  BMI 51.14 kg/m2  EXAM: deferred  EKG: deferred  ASSESSMENT: 1. Left chest and arm pain with exertion - negative nuclear stress test, EF 66% 2. Abnormal EKG with ischemic changes 3. Morbid obesity 4. Hypertension 5. Obstructive sleep apnea on CPAP 6. Strong family history of premature coronary disease  PLAN: 1.   Mrs. Pablo had a negative nuclear stress test for ischemia. EF was 66%. In general she feels better although had once small episode of chest discomfort when pushing a shopping cart. I suspect some of her pain is musculoskeletal. She does have cardiac risk factors I recommended that she continues to aggressively work on treating those, using CPAP which he is using and benefiting from as well as working on exercise and weight loss. Although should benefit her more. On happy to see her back as needed in the future.  Thank you very much for referring her to me for consultation.  Pixie Casino, MD, [facility] Attending Cardiologist [facility]  HILTY,Kenneth C 07/26/2014, 9:12 AM

## 2014-07-26 NOTE — Assessment & Plan Note (Signed)
Blood pressure at goal. Will continue regimen.

## 2014-07-26 NOTE — Assessment & Plan Note (Signed)
Unsatisfactory results with Claritin and Flonase. Patient also does not like aftertaste of Flonase. Will add Singulair and switch Flonase to Nasacort.

## 2014-07-26 NOTE — Assessment & Plan Note (Signed)
Unsure of etiology. Atarax for itching. Will follow-up if persists.

## 2014-07-26 NOTE — Assessment & Plan Note (Signed)
Patient's symptoms improved with CPAP. Continue current regimen.

## 2014-08-02 ENCOUNTER — Encounter: Payer: Self-pay | Admitting: Family Medicine

## 2014-08-23 ENCOUNTER — Encounter (HOSPITAL_BASED_OUTPATIENT_CLINIC_OR_DEPARTMENT_OTHER): Payer: Self-pay | Admitting: Emergency Medicine

## 2014-08-23 ENCOUNTER — Emergency Department (HOSPITAL_BASED_OUTPATIENT_CLINIC_OR_DEPARTMENT_OTHER): Payer: Medicare Other

## 2014-08-23 ENCOUNTER — Emergency Department (HOSPITAL_BASED_OUTPATIENT_CLINIC_OR_DEPARTMENT_OTHER)
Admission: EM | Admit: 2014-08-23 | Discharge: 2014-08-23 | Disposition: A | Discharge status: Home / Self Care | Payer: Medicare Other | Attending: Emergency Medicine | Admitting: Emergency Medicine

## 2014-08-23 DIAGNOSIS — R079 Chest pain, unspecified: Secondary | ICD-10-CM | POA: Insufficient documentation

## 2014-08-23 DIAGNOSIS — IMO0002 Reserved for concepts with insufficient information to code with codable children: Secondary | ICD-10-CM | POA: Diagnosis not present

## 2014-08-23 DIAGNOSIS — R51 Headache: Secondary | ICD-10-CM | POA: Insufficient documentation

## 2014-08-23 DIAGNOSIS — E669 Obesity, unspecified: Secondary | ICD-10-CM | POA: Insufficient documentation

## 2014-08-23 DIAGNOSIS — Z8742 Personal history of other diseases of the female genital tract: Secondary | ICD-10-CM | POA: Insufficient documentation

## 2014-08-23 DIAGNOSIS — Z91040 Latex allergy status: Secondary | ICD-10-CM | POA: Insufficient documentation

## 2014-08-23 DIAGNOSIS — R059 Cough, unspecified: Secondary | ICD-10-CM | POA: Diagnosis not present

## 2014-08-23 DIAGNOSIS — R42 Dizziness and giddiness: Secondary | ICD-10-CM | POA: Insufficient documentation

## 2014-08-23 DIAGNOSIS — Z79899 Other long term (current) drug therapy: Secondary | ICD-10-CM | POA: Insufficient documentation

## 2014-08-23 DIAGNOSIS — R200 Anesthesia of skin: Secondary | ICD-10-CM

## 2014-08-23 DIAGNOSIS — R05 Cough: Secondary | ICD-10-CM | POA: Insufficient documentation

## 2014-08-23 DIAGNOSIS — F3289 Other specified depressive episodes: Secondary | ICD-10-CM | POA: Insufficient documentation

## 2014-08-23 DIAGNOSIS — F329 Major depressive disorder, single episode, unspecified: Secondary | ICD-10-CM | POA: Diagnosis not present

## 2014-08-23 DIAGNOSIS — G4700 Insomnia, unspecified: Secondary | ICD-10-CM | POA: Diagnosis not present

## 2014-08-23 DIAGNOSIS — R209 Unspecified disturbances of skin sensation: Secondary | ICD-10-CM | POA: Diagnosis not present

## 2014-08-23 DIAGNOSIS — Z8739 Personal history of other diseases of the musculoskeletal system and connective tissue: Secondary | ICD-10-CM | POA: Insufficient documentation

## 2014-08-23 DIAGNOSIS — Z8619 Personal history of other infectious and parasitic diseases: Secondary | ICD-10-CM | POA: Insufficient documentation

## 2014-08-23 DIAGNOSIS — R519 Headache, unspecified: Secondary | ICD-10-CM

## 2014-08-23 DIAGNOSIS — Z8719 Personal history of other diseases of the digestive system: Secondary | ICD-10-CM | POA: Diagnosis not present

## 2014-08-23 DIAGNOSIS — I10 Essential (primary) hypertension: Secondary | ICD-10-CM | POA: Diagnosis not present

## 2014-08-23 NOTE — ED Notes (Signed)
Pt sts headache, denies n/v, denies photosensitivity. Pt sts  Right side of face feels numb. Stroke screen negative

## 2014-08-23 NOTE — ED Provider Notes (Signed)
CSN: [id number]     Arrival date & time 08/23/14  1658 History   First MD Initiated Contact with Patient 08/23/14 1716     Chief Complaint  Patient presents with  . Headache     (Consider location/radiation/quality/duration/timing/severity/associated sxs/prior Treatment) Patient is a 53 y.o. female presenting with headaches. The history is provided by the patient.  Headache Associated symptoms: congestion, cough and numbness   Associated symptoms: no abdominal pain, no back pain, no fever, no nausea, no neck pain, no photophobia, no sore throat and no vomiting    patient is [facility]. Patient's had a bilateral frontal headache not severe for 3 days. The discomfort in headache areas throbbing about 3/10. Not similar to past migraines. Patient's also had some congestion but no sinus pressure. Patient earlier today he had 3 episodes of some brief chest pain that lasted only a few seconds. Patient also has some brief episodes of right facial numbness that lasted only seconds. Patient concerned with the facial numbness and that's why she came in. Patient would not come in for the headache. Patient has a history of some seasonal allergies. She thought her congestion was related to that. Headache is throbbing in nature. Nonradiating.  Past Medical History  Diagnosis Date  . Hypertension   . Obesity   . HSV (herpes simplex virus) infection   . Headache(784.0)   . Depression      on meds  . BV (bacterial vaginosis)     recent dx - finish med 12/22/11  . Insomnia     on meds  . Sleep apnea     occas. uses CPAP  . Anal fissure     history  . Arthritis     feet  . Migraines   . Angioedema of lips 03/21/2011    Presumed 2nd to ACE    Past Surgical History  Procedure Laterality Date  . Neck surgery  2001    cervical fusion C4/5  . Cesarean section      x 3  . Tubal ligation  1992  . Cholecystectomy    . Endometrial ablation    . Knee surgery  oct 2013   minesctomy and debridement   Family History  Problem Relation Age of Onset  . Diabetes Mother   . Diabetes Father   . Diabetes Sister   . Cancer Paternal Aunt     cevical  . Cancer Paternal Aunt     cervical  . Heart attack Brother 81  . Heart failure Mother   . Hypertension Mother   . Stroke Mother    History  Substance Use Topics  . Smoking status: Never Smoker   . Smokeless tobacco: Never Used  . Alcohol Use: No     Comment: socially   OB History   Grav Para Term Preterm Abortions TAB SAB Ect Mult Living   3 3 3       3      Review of Systems  Constitutional: Negative for fever.  HENT: Positive for congestion. Negative for sore throat and trouble swallowing.   Eyes: Negative for photophobia and visual disturbance.  Respiratory: Positive for cough. Negative for shortness of breath.   Cardiovascular: Positive for chest pain.  Gastrointestinal: Negative for nausea, vomiting and abdominal pain.  Musculoskeletal: Negative for back pain and neck pain.  Skin: Negative for rash.  Neurological: Positive for numbness and headaches. Negative for speech difficulty and weakness.  Hematological: Does not bruise/bleed easily.  Psychiatric/Behavioral: Negative  for confusion.      Allergies  Ace inhibitors; Angiotensin receptor blockers; and Latex  Home Medications   Prior to Admission medications   Medication Sig Start Date End Date Taking? Authorizing Provider  amLODipine (NORVASC) 5 MG tablet Take 1 tablet (5 mg total) by mouth daily. 05/24/14   Gerda Diss, DO  cetirizine (ZYRTEC) 10 MG tablet Take 1 tablet (10 mg total) by mouth daily as needed for allergies. 07/22/13   Gerda Diss, DO  furosemide (LASIX) 20 MG tablet Take 1 tablet (20 mg total) by mouth daily. 05/24/14   Gerda Diss, DO  hydrOXYzine (ATARAX/VISTARIL) 10 MG tablet Take 1 tablet (10 mg total) by mouth 3 (three) times daily as needed. 07/24/14   Cordelia Poche, MD  montelukast (SINGULAIR) 10 MG tablet  Take 1 tablet (10 mg total) by mouth at bedtime. 07/24/14   Cordelia Poche, MD  polyethylene glycol powder (GLYCOLAX/MIRALAX) powder MIX AND TAKE 17 GRAMS BY MOUTH 2 TIMES DAILY    Gerda Diss, DO  topiramate (TOPAMAX) 50 MG tablet Take 1 tablet (50 mg total) by mouth 2 (two) times daily. 05/24/14   Gerda Diss, DO  tretinoin (RETIN-A) 0.025 % gel Apply topically at bedtime. 05/24/14   Gerda Diss, DO  triamcinolone (NASACORT AQ) 55 MCG/ACT AERO nasal inhaler Place 2 sprays into the nose daily. 07/24/14   Cordelia Poche, MD  triamterene-hydrochlorothiazide (DYAZIDE) 37.5-25 MG per capsule Take 1 each (1 capsule total) by mouth daily. 05/24/14   Gerda Diss, DO  valACYclovir (VALTREX) 500 MG tablet Take 1 tablet (500 mg total) by mouth daily.    Cordelia Poche, MD   BP 137/58  Pulse 70  Temp(Src) 98 F (36.7 C) (Oral)  Resp 18  Ht 5\' 1"  (1.549 m)  Wt 268 lb (121.564 kg)  BMI 50.66 kg/m2  SpO2 99% Physical Exam  Nursing note and vitals reviewed. Constitutional: She is oriented to person, place, and time. She appears well-developed and well-nourished. No distress.  HENT:  Head: Normocephalic and atraumatic.  Mouth/Throat: Oropharynx is clear and moist.  Eyes: Conjunctivae and EOM are normal. Pupils are equal, round, and reactive to light.  Neck: Normal range of motion.  Cardiovascular: Normal rate.   No murmur heard. Pulmonary/Chest: Effort normal and breath sounds normal. No respiratory distress. She has no wheezes. She has no rales. She exhibits no tenderness.  Abdominal: Soft. Bowel sounds are normal. There is no tenderness.  Musculoskeletal: Normal range of motion.  Neurological: She is alert and oriented to person, place, and time. No cranial nerve deficit. She exhibits normal muscle tone. Coordination normal.  Skin: Skin is warm. No rash noted.    ED Course  Procedures (including critical care time) Labs Review Labs Reviewed - No data to display  Imaging Review Ct Head  Wo Contrast  08/23/2014   CLINICAL DATA:  Headache for 3 days.  EXAM: CT HEAD WITHOUT CONTRAST  TECHNIQUE: Contiguous axial images were obtained from the base of the skull through the vertex without intravenous contrast.  COMPARISON:  None.  FINDINGS: The brain appears normal without infarct, hemorrhage, mass lesion, mass effect, midline shift or abnormal extra-axial fluid collection. There is no hydrocephalus or pneumocephalus. The calvarium is intact. Mucous retention cyst or polyp in the right maxillary sinus is incompletely visualized.  IMPRESSION: No acute finding.  Mucous retention cyst or polyp right maxillary sinus is incompletely visualized.   Electronically Signed   By: Inge Rise M.D.  On: 08/23/2014 18:46     EKG Interpretation None      MDM   Final diagnoses:  Headache, unspecified headache type  Numbness    Patient's head CT negative for any acute abnormalities. Patient's been having some intermittent numbness to the right side of the face. Brief and episodes. Patient's had some bilateral frontal headache fairly mild patient would not come in for that. Has been present for 3 days. Frontal headache is throbbing in nature. History of migraines but not like a migraine for her. No thunderclap headache. Patient also had very brief episodes of chest pain today lasting only a few seconds. Occurred 3 times. Patient now feels better still has a headache headache is not severe. If symptoms persist patient will need MRI. Patient return for new or worse symptoms. No evidence of stroke clinically today unlikely to represent a TIA complex. Not consistent with a migraine headache. Not consistent with a thunderclap headache or subarachnoid hemorrhage.    Fredia Sorrow, MD 08/23/14 [id number]

## 2014-08-23 NOTE — Discharge Instructions (Signed)
Return for any newer worse symptoms. If things do not return back to normal follow up with your record Dr. at the family practice for an MRI.

## 2014-09-16 ENCOUNTER — Other Ambulatory Visit: Payer: Self-pay | Admitting: Sports Medicine

## 2014-09-16 DIAGNOSIS — B001 Herpesviral vesicular dermatitis: Secondary | ICD-10-CM

## 2014-09-19 MED ORDER — VALACYCLOVIR HCL 500 MG PO TABS: 500.0000 mg | ORAL_TABLET | Freq: Every day | ORAL | Status: DC

## 2014-09-19 NOTE — Addendum Note (Signed)
Addended by: Johny Shears on: 09/19/2014 10:48 AM   Modules accepted: Orders

## 2014-09-19 NOTE — Telephone Encounter (Signed)
Spoke with patient informed her that flonase was sent in, she requested her valtrex also be sent in, I have done that for her

## 2014-09-20 ENCOUNTER — Encounter: Payer: Self-pay | Admitting: Family Medicine

## 2014-09-20 ENCOUNTER — Ambulatory Visit (INDEPENDENT_AMBULATORY_CARE_PROVIDER_SITE_OTHER): Payer: Medicare Other | Admitting: Family Medicine

## 2014-09-20 VITALS — BP 126/82 | HR 87 | Temp 98.5°F | Ht 61.0 in | Wt 269.5 lb

## 2014-09-20 DIAGNOSIS — M546 Pain in thoracic spine: Secondary | ICD-10-CM

## 2014-09-20 DIAGNOSIS — M549 Dorsalgia, unspecified: Secondary | ICD-10-CM | POA: Insufficient documentation

## 2014-09-20 MED ORDER — TRAMADOL HCL 50 MG PO TABS: 50.0000 mg | ORAL_TABLET | Freq: Four times a day (QID) | ORAL | Status: DC | PRN

## 2014-09-20 MED ORDER — CYCLOBENZAPRINE HCL 10 MG PO TABS: 10.0000 mg | ORAL_TABLET | Freq: Three times a day (TID) | ORAL | Status: DC | PRN

## 2014-09-20 NOTE — Assessment & Plan Note (Signed)
Appears musculoskeletal in origin. Will treat with tramadol and Flexeril PRN.  Follow up with PCP if fails to improve.

## 2014-09-20 NOTE — Progress Notes (Signed)
   Subjective:    Patient ID: Jessica [name], female    DOB: September 06, 1961, 53 y.o.   MRN: [id number]  HPI 53 year old female presents for same day appointment for evaluation of back pain/flank pain.  1) Back pain/Right flank pain - Patient reports she has had right upper back/flank pain for approximately 2 weeks - Reports that this came on suddenly. - No recent fall, trauma, injury.  She does note that she does exercises on a regular basis (for example toe touches). - She reports that the pain is moderate in severity (currently 6/10). - No exacerbating factors.  She's been taking frequent ibuprofen 800 mg (3 times a day) on and off for the past 2 weeks with no improvement.  - She denies any associated symptoms: Fever, abdominal pain, dysuria.  Review of Systems Per HPI    Objective:   Physical Exam Filed Vitals:   09/20/14 1445  BP: 126/82  Pulse: 87  Temp: 98.5 F (36.9 C)   Exam: General: well appearing female in no acute distress. Cardiovascular: RRR. No murmurs, rubs, or gallops. Respiratory: CTAB. No rales, rhonchi, or wheeze. Abdomen: Obese, soft, nontender, nondistended. Back: No spinal tenderness. No paraspinal muscle tenderness.  No appreciable spasm.  Negative CVA tenderness. Extremities: No LE edema.     Assessment & Plan:  See Problem List

## 2014-09-20 NOTE — Patient Instructions (Signed)
It was nice to see you today.  Take the flexeril and tramadol as needed. You can also take tylenol for pain as well.  Please follow up if you fail to improve.    Muscle Pain Muscle pain (myalgia) may be caused by many things, including:  Overuse or muscle strain, especially if you are not in shape. This is the most common cause of muscle pain.  Injury.  Bruises.  Viruses, such as the flu.  Infectious diseases.  Fibromyalgia, which is a chronic condition that causes muscle tenderness, fatigue, and headache.  Autoimmune diseases, including lupus.  Certain drugs, including ACE inhibitors and statins. Muscle pain may be mild or severe. In most cases, the pain lasts only a short time and goes away without treatment. To diagnose the cause of your muscle pain, your health care provider will take your medical history. This means he or she will ask you when your muscle pain began and what has been happening. If you have not had muscle pain for very long, your health care provider may want to wait before doing much testing. If your muscle pain has lasted a long time, your health care provider may want to run tests right away. If your health care provider thinks your muscle pain may be caused by illness, you may need to have additional tests to rule out certain conditions.  Treatment for muscle pain depends on the cause. Home care is often enough to relieve muscle pain. Your health care provider may also prescribe anti-inflammatory medicine. HOME CARE INSTRUCTIONS Watch your condition for any changes. The following actions may help to lessen any discomfort you are feeling:  Only take over-the-counter or prescription medicines as directed by your health care provider.  Apply ice to the sore muscle:  Put ice in a plastic bag.  Place a towel between your skin and the bag.  Leave the ice on for 15-20 minutes, 3-4 times a day.  You may alternate applying hot and cold packs to the muscle as  directed by your health care provider.  If overuse is causing your muscle pain, slow down your activities until the pain goes away.  Remember that it is normal to feel some muscle pain after starting a workout program. Muscles that have not been used often will be sore at first.  Do regular, gentle exercises if you are not usually active.  Warm up before exercising to lower your risk of muscle pain.  Do not continue working out if the pain is very bad. Bad pain could mean you have injured a muscle. SEEK MEDICAL CARE IF:  Your muscle pain gets worse, and medicines do not help.  You have muscle pain that lasts longer than 3 days.  You have a rash or fever along with muscle pain.  You have muscle pain after a tick bite.  You have muscle pain while working out, even though you are in good physical condition.  You have redness, soreness, or swelling along with muscle pain.  You have muscle pain after starting a new medicine or changing the dose of a medicine. SEEK IMMEDIATE MEDICAL CARE IF:  You have trouble breathing.  You have trouble swallowing.  You have muscle pain along with a stiff neck, fever, and vomiting.  You have severe muscle weakness or cannot move part of your body. MAKE SURE YOU:   Understand these instructions.  Will watch your condition.  Will get help right away if you are not doing well or get worse.  Document Released: 11/06/2006 Document Revised: 12/20/2013 Document Reviewed: 10/11/2013 [facility] Patient Information 2015 [place], [place]. This information is not intended to replace advice given to you by your health care provider. Make sure you discuss any questions you have with your health care provider.

## 2014-10-16 ENCOUNTER — Other Ambulatory Visit: Payer: Self-pay | Admitting: *Deleted

## 2014-10-18 ENCOUNTER — Other Ambulatory Visit: Payer: Self-pay | Admitting: *Deleted

## 2014-10-18 MED ORDER — POLYETHYLENE GLYCOL 3350 17 GM/SCOOP PO POWD
17.0000 g | Freq: Every day | ORAL | Status: DC | PRN
Start: 2014-10-18 — End: 2015-04-05

## 2014-10-18 NOTE — Telephone Encounter (Signed)
Spoke with patient and informed her rx has been filled for miralax

## 2014-10-23 DIAGNOSIS — L811 Chloasma: Secondary | ICD-10-CM | POA: Diagnosis not present

## 2014-10-30 ENCOUNTER — Encounter: Payer: Self-pay | Admitting: Family Medicine

## 2014-10-30 ENCOUNTER — Ambulatory Visit (INDEPENDENT_AMBULATORY_CARE_PROVIDER_SITE_OTHER): Payer: Medicare Other | Admitting: Family Medicine

## 2014-10-30 VITALS — BP 145/84 | HR 81 | Temp 98.1°F | Wt 278.0 lb

## 2014-10-30 DIAGNOSIS — M25562 Pain in left knee: Secondary | ICD-10-CM

## 2014-10-30 NOTE — Progress Notes (Signed)
    Subjective   Jessica [name] is a 53 y.o. female that presents for a same day visit  1. Left leg pain: started yesterday in her left knee. Radiates to her foot and proximally to her groin. Sharp and achy pain. Hurts to stand up to walk. Stiff when standing up. Had some swelling. Denies any locking or giving away. Tingling in the anterior shin. No numbness. No problems like this before in left leg. Feels the same as the right knee. Took some ultram and ibuprofen but not helping. Heat not helping. Denies any injections in the left knee.   History  Substance Use Topics  . Smoking status: Never Smoker   . Smokeless tobacco: Never Used  . Alcohol Use: No     Comment: socially    ROS Per HPI  Objective   BP 145/84 mmHg  Pulse 81  Temp(Src) 98.1 F (36.7 C) (Oral)  Wt 278 lb (126.1 kg)  General: NAD, alert, well appearing  Knee Exam:  Laterality: left Appearance: symmetric, no erythema or ecchymosis  Edema: no   Tenderness: yes  medial Range of Motion: Passive Extension: normal Flexion:normal Active Extension: normal Flexion: normal Laxity: none Maneuvers: McMurray's: neg Patellar Compression: pos Strength:  Quadricep: 5/5 Hamstring: 5/5 Gait: antalgic     INJECTION:  Patient was given informed consent, signed copy in the chart. Appropriate time out was taken. Area prepped and draped in usual sterile fashion. One cc of methylprednisolone 40 mg/ml plus 4 cc of 1% lidocaine without epinephrine was injected into the left knee joint using a perpendicular anterior, lateral approach. The patient tolerated the procedure well. There were no complications. Post procedure instructions were given.  Assessment and Plan   Please refer to problem based charting of assessment and plan

## 2014-10-30 NOTE — Patient Instructions (Signed)
Thank you for coming in,   Take it easy for the next day since doing the injection.   Please call us if you have any redness, swelling or warmth.   Compression on your knees with help.   Icing your knees with help.   If you continue to have pain in your knee then please follow up with your primary doctor.    Please feel free to call with any questions or concerns at any time, at [phone]. --Dr. Raeford Razor

## 2014-10-31 ENCOUNTER — Encounter: Payer: Self-pay | Admitting: Family Medicine

## 2014-10-31 DIAGNOSIS — M25562 Pain in left knee: Secondary | ICD-10-CM | POA: Insufficient documentation

## 2014-10-31 NOTE — Assessment & Plan Note (Signed)
Most likely 2/2 to osteoarthritis as seen on imaging. Complicated with PF syndrome, pes planus and obesity.  - injection as described in note  - continue NSAIDS or tylenol for pain  - ICE PRN  - wearing appropriate shoes with arch correction - expressed need for exercise and losing weight  - Discussed with Dr. Wendy Poet

## 2014-11-01 ENCOUNTER — Telehealth: Payer: Self-pay | Admitting: *Deleted

## 2014-11-01 NOTE — Telephone Encounter (Signed)
I did not see/evaluate her. If her this is worsening, patient should return for evaluation. I will also forward this to the physician that saw her on Monday.

## 2014-11-01 NOTE — Telephone Encounter (Signed)
I gave the patient an injection into her right knee. She tolerated the procedure well with no complications. She has a history of osteoarthritis and denied any injury prompting her pain. If she still has an excessive amount of pain then she should be re-evaluated.   At re-evaluation, can consider imaging of her left knee and possible stronger pain medication. Patient may be candidate for referral to ortho for supartz injection evaluation. Supartz unlikely if she is without coverage.   Jessica Ax, MD PGY-2, [facility] 11/01/2014, 2:18 PM

## 2014-11-01 NOTE — Telephone Encounter (Signed)
Pt called this stating she was seen on Monday for knee pain.  Pt was giving a steroid injection.  Pt stated that she has not sleep in 2 days, pain is worse than before.  She has taken Ibuprofen, Ultram, Flexeril and nothing is helping.  She is requesting something else for her pain.  Derl Barrow, RN

## 2014-11-15 ENCOUNTER — Other Ambulatory Visit: Payer: Self-pay | Admitting: *Deleted

## 2014-11-15 MED ORDER — TOPIRAMATE 50 MG PO TABS: 50.0000 mg | ORAL_TABLET | Freq: Two times a day (BID) | ORAL | Status: DC

## 2015-01-03 ENCOUNTER — Ambulatory Visit (INDEPENDENT_AMBULATORY_CARE_PROVIDER_SITE_OTHER): Payer: Medicare Other | Admitting: Family Medicine

## 2015-01-03 VITALS — BP 148/82 | HR 95 | Temp 98.4°F | Ht 61.0 in | Wt 270.8 lb

## 2015-01-03 DIAGNOSIS — R7309 Other abnormal glucose: Secondary | ICD-10-CM | POA: Diagnosis not present

## 2015-01-03 DIAGNOSIS — G4700 Insomnia, unspecified: Secondary | ICD-10-CM | POA: Diagnosis not present

## 2015-01-03 DIAGNOSIS — M545 Low back pain, unspecified: Secondary | ICD-10-CM

## 2015-01-03 DIAGNOSIS — M25562 Pain in left knee: Secondary | ICD-10-CM

## 2015-01-03 DIAGNOSIS — R358 Other polyuria: Secondary | ICD-10-CM | POA: Insufficient documentation

## 2015-01-03 DIAGNOSIS — R3589 Other polyuria: Secondary | ICD-10-CM

## 2015-01-03 DIAGNOSIS — R7303 Prediabetes: Secondary | ICD-10-CM

## 2015-01-03 LAB — POCT GLYCOSYLATED HEMOGLOBIN (HGB A1C): Hemoglobin A1C: 5.6

## 2015-01-03 MED ORDER — ZOLPIDEM TARTRATE 5 MG PO TABS: 5.0000 mg | ORAL_TABLET | Freq: Every evening | ORAL | Status: DC | PRN

## 2015-01-03 MED ORDER — CYCLOBENZAPRINE HCL 10 MG PO TABS: 10.0000 mg | ORAL_TABLET | Freq: Three times a day (TID) | ORAL | Status: DC | PRN

## 2015-01-03 NOTE — Progress Notes (Signed)
    Subjective    Jessica [name] is a 54 y.o. female that presents for an office visit.   1. Back pain: patient had a fall two weeks ago while at a restaurant. She slipped on a puddle of water. She fell on her left side on to her left leg, buttock and arm. She has been using ice and heat. Ibuprofen and Tylenol have not helped. She just started a new job which requires a lot of physical involvement and that is when pain is worse.  2. Knee pain: This is a chronic issue for her right knee, but her left knee has started to hurt in the last few weeks, especially with the cold weather. She has been able to move and exercise, but not for prolonged periods of time. Tylenol and ibuprofen have not helped. She sometimes puts heat/ice and props it up, which helps.  3. Insomnia: She has to wake up at 5am for work. She goes to her bed around 9pm and does not fall asleep until about 12am. She does not use her TV, cell phone. She usually eats before 7pm. She has a small night light, which she turns off when she lays down. She sometimes has thoughts of general anxiety. She has had issues with anxiety in the past.   4. Polyuria/polydypsia: Symptoms started about 6 months ago. No nausea, vomiting. Family history of diabetes in sister, mother and father. No dysuria.  History  Substance Use Topics  . Smoking status: Never Smoker   . Smokeless tobacco: Never Used  . Alcohol Use: No     Comment: socially    Allergies  Allergen Reactions  . Ace Inhibitors     Lip swelling with lisinopril  . Angiotensin Receptor Blockers     Lip swelling with ACE  . Latex Itching and Rash    No orders of the defined types were placed in this encounter.    ROS  Per HPI   Objective   BP 148/82 mmHg  Pulse 95  Temp(Src) 98.4 F (36.9 C) (Oral)  Ht 5\' 1"  (1.549 m)  Wt 270 lb 12.8 oz (122.834 kg)  BMI 51.19 kg/m2  General: Well appearing female in no distress Musculoskeletal: A lot of surrounding tissue surrounds  the knee joint bilaterally. Appears to have been an endpoint on valgus and varus maneuvers of left knee. Full range of motion present. Could not appreciate any effusions and no tenderness on palpation of joint lines. Tenderness and spasm of left lumbar back without midline tenderness. Full range of motion of back Neuro: Alert  Assessment and Plan   Please refer to problem based charting of assessment and plan

## 2015-01-03 NOTE — Patient Instructions (Signed)
Thank you for coming to see me today. It was a pleasure. Today we talked about:   Back pain: I will prescribe Flexeril.  Knee pain: continue using heat and ice  Insomnia: I will prescribe Ambien 5mg . We can further discuss this at our next visit  Polyuria/polydipsia: We will get an A1C  Please make an appointment to see me in one month for follow-up.  If you have any questions or concerns, please do not hesitate to call the office at [phone].  Sincerely,  Cordelia Poche, MD

## 2015-01-04 DIAGNOSIS — M545 Low back pain, unspecified: Secondary | ICD-10-CM | POA: Insufficient documentation

## 2015-01-04 HISTORY — DX: Low back pain, unspecified: M54.50

## 2015-01-04 NOTE — Assessment & Plan Note (Signed)
This is s/p recent fall. Symptoms have improved some but still present especially when working.  Flexeril 10mg  TID prn #30 for short term treatment  Heat to the area

## 2015-01-04 NOTE — Assessment & Plan Note (Signed)
Does not seem to be related to external stimuli, but patient states symptoms that make me think there is a big anxiety component to this. She has received Ambien in the past for this problem  Short course of Ambien 5mg  qHS PRN  Plan to further explore this issue and treat a possible GAD component. Discussed this with patient and the fact that I do not plan to keep Ambien as long term treatment.

## 2015-01-04 NOTE — Assessment & Plan Note (Signed)
Patient is really concerned she may have diabetes. Otherwise, do not think this is related to diabetes. Does not sound like UTI. Feeling like this may be psychogenic.  A1C today  Follow-up with lab work and urine studies if symptoms persist

## 2015-01-04 NOTE — Assessment & Plan Note (Signed)
Patient recently given steroid injection for this problem. History of osteoarthritis on imaging. Icing has helped with pain.  Continue icing PRN  Continue Ibuprofen/Tylenol  Continued to recommend weight loss (although patient states she is attempting to start)

## 2015-01-05 ENCOUNTER — Telehealth: Payer: Self-pay | Admitting: *Deleted

## 2015-01-05 NOTE — Telephone Encounter (Signed)
-----   Message from Cordelia Poche, MD sent at 01/04/2015  5:42 PM EST ----- Please inform patient of her test results (she is not diabetic). Thanks.

## 2015-01-05 NOTE — Telephone Encounter (Signed)
Spoke with patient and informed her of below result

## 2015-02-15 ENCOUNTER — Encounter: Payer: Self-pay | Admitting: Family Medicine

## 2015-02-15 ENCOUNTER — Ambulatory Visit (INDEPENDENT_AMBULATORY_CARE_PROVIDER_SITE_OTHER): Payer: Medicare Other | Admitting: Family Medicine

## 2015-02-15 VITALS — BP 138/75 | HR 75 | Temp 98.2°F | Ht 61.0 in | Wt 266.1 lb

## 2015-02-15 DIAGNOSIS — M1711 Unilateral primary osteoarthritis, right knee: Secondary | ICD-10-CM

## 2015-02-15 DIAGNOSIS — I10 Essential (primary) hypertension: Secondary | ICD-10-CM | POA: Diagnosis not present

## 2015-02-15 NOTE — Patient Instructions (Signed)
Thank you for coming to see me today. It was a pleasure. Today we talked about:   Right knee pain: We gave you an injection to your knee. Hopefully this helps with your pain. If this continues to be a problem, I will refer you to orthopedic surgery for follow-up  Hypertension: no changes to medications today. Your chest pain does not sound cardiac and combined with your stress test last year, I am more reassured. If this continues to be a problem, please make sure to follow-up with me  Please make an appointment to see me in 3 months for follow-up.  If you have any questions or concerns, please do not hesitate to call the office at [phone].  Sincerely,  Cordelia Poche, MD

## 2015-02-15 NOTE — Progress Notes (Signed)
    Subjective    Jessica [name] is a 54 y.o. female that presents for an office visit.   1. Right knee pain: Increased since the weather has gotten cold. She has started to lose her balance and has fallen twice this week. She states her falls are not related to weakness. She has not hit her head.  2. Hypertension: Adherent with amlodipine and Dyazide. She reports intermittent sharp chest pain that usually last a few seconds, last occuring last week. She has had associated shortness of breath. No radiation. Last occurred today, lasting a few seconds.    History  Substance Use Topics  . Smoking status: Never Smoker   . Smokeless tobacco: Never Used  . Alcohol Use: No     Comment: socially    Allergies  Allergen Reactions  . Ace Inhibitors     Lip swelling with lisinopril  . Angiotensin Receptor Blockers     Lip swelling with ACE  . Latex Itching and Rash    No orders of the defined types were placed in this encounter.    ROS  Per HPI   Objective   BP 138/75 mmHg  Pulse 75  Temp(Src) 98.2 F (36.8 C) (Oral)  Ht 5\' 1"  (1.549 m)  Wt 266 lb 1.6 oz (120.702 kg)  BMI 50.31 kg/m2  General: Obese, well appearing, no distress    Musculoskeletal: Left knee: No joint line tenderness, full range of motion, mild crepitus when extending. No laxity noted.  INJECTION:  Patient was given informed consent, signed copy in the chart. Appropriate time out was taken. Area prepped in usual sterile fashion. One cc of methylprednisolone 40 mg/ml plus 4 cc of 1% lidocaine without epinephrine was injected into the right knee joint using a perpendicular anterior, lateral approach. The patient tolerated the procedure well. There were no complications. Post procedure instructions were given.  Assessment and Plan   Please refer to problem based charting of assessment and plan

## 2015-02-19 DIAGNOSIS — I10 Essential (primary) hypertension: Secondary | ICD-10-CM

## 2015-02-19 DIAGNOSIS — M1711 Unilateral primary osteoarthritis, right knee: Secondary | ICD-10-CM | POA: Insufficient documentation

## 2015-02-19 HISTORY — DX: Essential (primary) hypertension: I10

## 2015-02-19 NOTE — Assessment & Plan Note (Signed)
Long history of osteoarthritis. Patient has had injections to her knees which have helped. Has seen orthopedic surgery who recommended knee replacement, however, requested patient lose more weight.  Right knee injection today

## 2015-02-19 NOTE — Assessment & Plan Note (Signed)
At goal. No side effects from medication  Continue regimen

## 2015-02-23 ENCOUNTER — Ambulatory Visit (INDEPENDENT_AMBULATORY_CARE_PROVIDER_SITE_OTHER): Payer: Medicare Other | Admitting: Obstetrics & Gynecology

## 2015-02-23 ENCOUNTER — Encounter: Payer: Self-pay | Admitting: Obstetrics & Gynecology

## 2015-02-23 VITALS — BP 140/102 | HR 72 | Ht 61.0 in | Wt 260.0 lb

## 2015-02-23 DIAGNOSIS — N941 Dyspareunia: Secondary | ICD-10-CM | POA: Diagnosis not present

## 2015-02-23 DIAGNOSIS — IMO0002 Reserved for concepts with insufficient information to code with codable children: Secondary | ICD-10-CM

## 2015-02-23 MED ORDER — OSPEMIFENE 60 MG PO TABS: 60.0000 mg | ORAL_TABLET | Freq: Every day | ORAL | Status: DC

## 2015-02-23 NOTE — Progress Notes (Signed)
   CLINIC ENCOUNTER NOTE  History:  54 y.o. [id number] here today for discussion of dyspareunia and vaginal dryness. Has used lubrication agents in the past and estrogen cream, these did not help much. Wants to know what else she can use. No other symptoms.  The following portions of the patient's history were reviewed and updated as appropriate: allergies, current medications, past family history, past medical history, past social history, past surgical history and problem list. Normal pap and negative HRHPV on 06/20/14.  Normal mammogram on 01/25/14.   Review of Systems:  Pertinent items are noted in HPI.  Objective:  Physical Exam BP 140/102 mmHg  Pulse 72  Ht 5\' 1"  (1.549 m)  Wt 260 lb (117.935 kg)  BMI 49.15 kg/m2 Gen: NAD Abd: Soft, obese, nontender and nondistended Pelvic: Normal appearing external genitalia; normal appearing vaginal mucosa and cervix.  Normal discharge seen.   Assessment & Plan:  Counseled about Osphena, patient wants to try this for now. Will monitor response. Routine preventative health maintenance measures emphasized.   Verita Schneiders, MD, [place] Attending [place] for [employer], [place]

## 2015-02-23 NOTE — Patient Instructions (Signed)
Return to clinic for any scheduled appointments or for any gynecologic concerns as needed.   

## 2015-02-27 ENCOUNTER — Other Ambulatory Visit: Payer: Self-pay

## 2015-02-27 DIAGNOSIS — Z1231 Encounter for screening mammogram for malignant neoplasm of breast: Secondary | ICD-10-CM

## 2015-03-09 ENCOUNTER — Ambulatory Visit
Admission: RE | Admit: 2015-03-09 | Discharge: 2015-03-09 | Disposition: A | Discharge status: Home / Self Care | Payer: Medicare Other | Source: Ambulatory Visit

## 2015-03-09 DIAGNOSIS — Z1231 Encounter for screening mammogram for malignant neoplasm of breast: Secondary | ICD-10-CM | POA: Diagnosis not present

## 2015-03-22 ENCOUNTER — Other Ambulatory Visit: Payer: Self-pay | Admitting: *Deleted

## 2015-03-23 MED ORDER — HYDROXYZINE HCL 10 MG PO TABS: 10.0000 mg | ORAL_TABLET | Freq: Three times a day (TID) | ORAL | Status: DC | PRN

## 2015-03-26 ENCOUNTER — Other Ambulatory Visit: Payer: Self-pay | Admitting: *Deleted

## 2015-03-28 ENCOUNTER — Encounter: Payer: Self-pay | Admitting: Family Medicine

## 2015-03-28 ENCOUNTER — Ambulatory Visit (INDEPENDENT_AMBULATORY_CARE_PROVIDER_SITE_OTHER): Payer: Medicare Other | Admitting: Family Medicine

## 2015-03-28 VITALS — BP 141/84 | HR 89 | Temp 98.6°F | Ht 61.0 in | Wt 262.2 lb

## 2015-03-28 DIAGNOSIS — M23303 Other meniscus derangements, unspecified medial meniscus, right knee: Secondary | ICD-10-CM | POA: Diagnosis not present

## 2015-03-28 MED ORDER — TRAMADOL HCL 50 MG PO TABS: 50.0000 mg | ORAL_TABLET | Freq: Three times a day (TID) | ORAL | Status: DC | PRN

## 2015-03-28 MED ORDER — MELOXICAM 15 MG PO TABS: 15.0000 mg | ORAL_TABLET | Freq: Every day | ORAL | Status: DC

## 2015-03-28 NOTE — Progress Notes (Signed)
Jessica [name] is a 54 y.o. female who presents to [facility] today for ongoing R knee Pain.  R knee pain - Ongoing since 2012-2013.  Does endorse slipping about that time on rain and twisting her knee.  Does not remember if she had any effusion at that time.  Pain has been intermittent since then, worse with knee flexion.  Does endorse occasional locking of the R knee but denies giving way or catching.  She has tried ibuprofen, tylenol, and voltaren gel without relief.  Did have one intraarticular injection in February 2016 that did not give her any relief.  Previous X-ray in 2014 which showed some mild DJD of the medial compartment of the knee.  Previous menisectomy in 2013 by Dr. [name] with residual pain.    PMH reviewed.  History  Substance Use Topics  . Smoking status: Never Smoker   . Smokeless tobacco: Never Used  . Alcohol Use: No     Comment: socially   ROS as above otherwise neg   Exam:  BP 141/84 mmHg  Pulse 89  Temp(Src) 98.6 F (37 C) (Oral)  Ht 5\' 1"  (1.549 m)  Wt 262 lb 3.2 oz (118.933 kg)  BMI 49.57 kg/m2 Gen: Well NAD Cardio: RRR, No M/G/R Pulm: CTAB Knee:  Exam: General (compare with less affected knee)  1. Observation - No ecchymosis, knee effusion 2. Tenderness to Palpation - + TTP R medial joint line  3. Normal Range of Motion  Exam: Patellofemoral - Minimal crepitus on extension  Exam: Anterior Cruciate Ligament (ACL) Stability Tests - Normal  Exam: Posterior Cruciate Ligament (PCL) Tests - Normal  Exam: Collateral ligament evaluation - Normal  Exam: Meniscus Evaluation  1. McMurray's Test Yes.   2. Apley's Compression Test and Apley's Distraction Test Yes.   3. Thessaly Test - + R side  4. Duck Walk - Yes.  + R side    Neurovascular Status - Intact B/L LE  MRI R knee 2013 IMPRESSION:  1. Wide radial tear involving the posterior horn of the medial meniscus. 2. Intact ligamentous structures and no acute bony findings. 3. Tricompartmental  degenerative changes most notable in the medial compartment. Stress-related edema in the medial tibial plateau. 4. Moderate sized joint effusion.

## 2015-03-28 NOTE — Assessment & Plan Note (Signed)
Ongoing issue for pt.  Probable chronic degeneration with possible bucket handle tear.  Previous radial tear with menisectomy by Dr. [name] in 2013, but had repeat injury after this - Trial of Mobic, Tylenol 650 mg TID, Tramadol - Could consider PT - Knee x-ray, standing if no improvement in 2-3 weeks.  Would order MRI as well to evaluate for repeat injury/tear at that time.

## 2015-03-28 NOTE — Patient Instructions (Signed)
Tylenol 650 mg three times per day Mobic 15 mg once per day Tramadol 50-100 mg two-three times per day Follow up in 2 weeks   Thanks, Dr. Awanda Mink

## 2015-03-29 ENCOUNTER — Ambulatory Visit: Payer: Medicare Other | Admitting: Family Medicine

## 2015-04-05 ENCOUNTER — Other Ambulatory Visit: Payer: Self-pay | Admitting: *Deleted

## 2015-04-06 MED ORDER — POLYETHYLENE GLYCOL 3350 17 GM/SCOOP PO POWD: 17.0000 g | Freq: Every day | ORAL | Status: DC | PRN

## 2015-05-10 ENCOUNTER — Encounter: Payer: Self-pay | Admitting: Family Medicine

## 2015-05-10 ENCOUNTER — Ambulatory Visit (INDEPENDENT_AMBULATORY_CARE_PROVIDER_SITE_OTHER): Payer: Medicare Other | Admitting: Family Medicine

## 2015-05-10 ENCOUNTER — Other Ambulatory Visit: Payer: Self-pay | Admitting: *Deleted

## 2015-05-10 VITALS — BP 143/81 | HR 83 | Temp 98.4°F | Ht 61.0 in | Wt 264.3 lb

## 2015-05-10 DIAGNOSIS — J069 Acute upper respiratory infection, unspecified: Secondary | ICD-10-CM

## 2015-05-10 DIAGNOSIS — B9789 Other viral agents as the cause of diseases classified elsewhere: Principal | ICD-10-CM

## 2015-05-10 MED ORDER — IPRATROPIUM BROMIDE 0.06 % NA SOLN: 2.0000 | Freq: Four times a day (QID) | NASAL | Status: DC

## 2015-05-10 NOTE — Progress Notes (Signed)
Patient ID: Jessica [name], female   DOB: 1961-11-26, 54 y.o.   MRN: [id number] Subjective:   CC: Headache and sore throat  HPI:   Patient presents to same-day appointment for headache and sore throat. Symptoms present for 4 days. She has also had body aches, subjective fever and chills, and cough since last night that is dry with occasional yellowish mucus production. She has occasional nasal congestion and facial pain. Denies shortness of breath or decrease in liquid intake. She does have a decreased appetite. She works for a patient who has recently been sick. She had no history of rash, but bites, or recent travel. She has also not had any medication changes.  Review of Systems - Per HPI.   PMH- venous insufficiency, trigeminal neuralgia, OSA, rotator cuff sprain, osteoarthritis, morbid obesity, migraine history, menorrhagia, melasma, insomnia, history of HSV-1 dermatitis, hypertension, prediabetes, depressive disorder, history of chest pain, history of bilateral blurred vision, back pain, allergic rhinitis  She thinks she had a flu shot this year.    Objective:  Physical Exam BP 143/81 mmHg  Pulse 83  Temp(Src) 98.4 F (36.9 C) (Oral)  Ht 5\' 1"  (1.549 m)  Wt 264 lb 5 oz (119.891 kg)  BMI 49.97 kg/m2 GEN: NAD Cardiovascular: Regular rate and rhythm, no murmurs rubs or gallops Pulmonary: Clear to auscultation bilaterally, no wheezes or crackles Abdomen: Soft, nontender HEENT: Atraumatic, normocephalic, sclera clear, extraocular movements intact, colored contacts in place, TMs with clear fluid and retracted bilaterally, no erythema or purulence, oropharynx clear with moist mucous membranes, neck supple, no obvious lymphadenopathy, no rash or cyanosis EXTR: No LE edema or calf tenderness    Assessment:     Jessica [name] is a 54 y.o. female here for URI symptoms.    Plan:     # See problem list and after visit summary for problem-specific plans. -11 on screening pHQ9, no SI or  HI, checked "not difficult at all". Will defer to PCP for follow-up as we did not discuss this.   # Health Maintenance: Not discussed.  Follow-up: Follow up in 4-5 days PRN lack of symptom improvement or immediately if severe symptoms, shortness of breath, or problems hydrating.   Hilton Sinclair, MD [place]

## 2015-05-10 NOTE — Patient Instructions (Signed)
This sounds like a viral upper respiratory illness with sinusitis. There is no obvious sign of a bacterial infection. Drink plenty of warm fluids and get lots of rest. You can use the Atrovent nasal spray for nasal congestion and postnasal drip. You can also use nasal saline spray to help rinse out the sinuses. Throat drops and/or tea with honey can also help sore throats. Follow-up in 4-5 days if symptoms have not significantly improved, or sooner if worsening symptoms or any shortness of breath.   Hilton Sinclair, MD

## 2015-05-10 NOTE — Assessment & Plan Note (Signed)
Symptoms and exam most consistent with viral URI with sinusitis. No obvious sign of bacterial infection on exam. Well-appearing with normal VS. -Discussed plenty of warm fluids and rest. Work note can be provided Monday if patient needs to be out that day. -Atrovent nasal spray prescribed for nasal congestion. Also discussed using nasal saline spray. -Lozenges, tea and honey for sore throat -Follow-up 4-5 days if lack of symptom improvement or immediately for symptoms worsen or shortness of breath.

## 2015-05-11 MED ORDER — TRIAMTERENE-HCTZ 37.5-25 MG PO CAPS: 1.0000 | ORAL_CAPSULE | Freq: Every day | ORAL | Status: DC

## 2015-05-18 ENCOUNTER — Emergency Department (HOSPITAL_BASED_OUTPATIENT_CLINIC_OR_DEPARTMENT_OTHER)
Admission: EM | Admit: 2015-05-18 | Discharge: 2015-05-18 | Disposition: A | Discharge status: Home / Self Care | Payer: Medicare Other | Attending: Emergency Medicine | Admitting: Emergency Medicine

## 2015-05-18 ENCOUNTER — Encounter (HOSPITAL_BASED_OUTPATIENT_CLINIC_OR_DEPARTMENT_OTHER): Payer: Self-pay | Admitting: Family Medicine

## 2015-05-18 ENCOUNTER — Emergency Department (HOSPITAL_BASED_OUTPATIENT_CLINIC_OR_DEPARTMENT_OTHER): Payer: Medicare Other

## 2015-05-18 DIAGNOSIS — Z79899 Other long term (current) drug therapy: Secondary | ICD-10-CM | POA: Diagnosis not present

## 2015-05-18 DIAGNOSIS — R000 Tachycardia, unspecified: Secondary | ICD-10-CM | POA: Insufficient documentation

## 2015-05-18 DIAGNOSIS — R51 Headache: Secondary | ICD-10-CM | POA: Insufficient documentation

## 2015-05-18 DIAGNOSIS — J0100 Acute maxillary sinusitis, unspecified: Secondary | ICD-10-CM | POA: Diagnosis not present

## 2015-05-18 DIAGNOSIS — F329 Major depressive disorder, single episode, unspecified: Secondary | ICD-10-CM | POA: Insufficient documentation

## 2015-05-18 DIAGNOSIS — Z7951 Long term (current) use of inhaled steroids: Secondary | ICD-10-CM | POA: Diagnosis not present

## 2015-05-18 DIAGNOSIS — Z91040 Latex allergy status: Secondary | ICD-10-CM | POA: Diagnosis not present

## 2015-05-18 DIAGNOSIS — Z8619 Personal history of other infectious and parasitic diseases: Secondary | ICD-10-CM | POA: Insufficient documentation

## 2015-05-18 DIAGNOSIS — I10 Essential (primary) hypertension: Secondary | ICD-10-CM | POA: Insufficient documentation

## 2015-05-18 DIAGNOSIS — G4733 Obstructive sleep apnea (adult) (pediatric): Secondary | ICD-10-CM | POA: Insufficient documentation

## 2015-05-18 DIAGNOSIS — J029 Acute pharyngitis, unspecified: Secondary | ICD-10-CM | POA: Diagnosis not present

## 2015-05-18 DIAGNOSIS — J3489 Other specified disorders of nose and nasal sinuses: Secondary | ICD-10-CM | POA: Diagnosis not present

## 2015-05-18 DIAGNOSIS — Z9981 Dependence on supplemental oxygen: Secondary | ICD-10-CM | POA: Diagnosis not present

## 2015-05-18 DIAGNOSIS — R630 Anorexia: Secondary | ICD-10-CM | POA: Diagnosis not present

## 2015-05-18 DIAGNOSIS — E669 Obesity, unspecified: Secondary | ICD-10-CM | POA: Insufficient documentation

## 2015-05-18 DIAGNOSIS — R519 Headache, unspecified: Secondary | ICD-10-CM

## 2015-05-18 LAB — BASIC METABOLIC PANEL
ANION GAP: 8 (ref 5–15)
BUN: 9 mg/dL (ref 6–20)
CALCIUM: 8.9 mg/dL (ref 8.9–10.3)
CO2: 25 mmol/L (ref 22–32)
Chloride: 106 mmol/L (ref 101–111)
Creatinine, Ser: 0.66 mg/dL (ref 0.44–1.00)
GFR calc Af Amer: >60 mL/min (ref >60)
GLUCOSE: 103 mg/dL — AB (ref 65–99)
Potassium: 3.3 mmol/L — ABNORMAL LOW (ref 3.5–5.1)
SODIUM: 139 mmol/L (ref 135–145)

## 2015-05-18 LAB — D-DIMER, QUANTITATIVE (NOT AT ARMC): D-Dimer, Quant: 0.42 ug/mL-FEU (ref 0.00–0.48)

## 2015-05-18 LAB — RAPID STREP SCREEN (MED CTR MEBANE ONLY): STREPTOCOCCUS, GROUP A SCREEN (DIRECT): NEGATIVE

## 2015-05-18 LAB — TROPONIN I: Troponin I: <0.03 ng/mL (ref <0.031)

## 2015-05-18 MED ORDER — IBUPROFEN 800 MG PO TABS
800.0000 mg | ORAL_TABLET | Freq: Once | ORAL | Status: AC
Start: 2015-05-18 — End: 2015-05-18
  Administered 2015-05-18: 800 mg via ORAL
  Filled 2015-05-18: qty 1

## 2015-05-18 MED ORDER — AMOXICILLIN-POT CLAVULANATE 500-125 MG PO TABS: 1.0000 | ORAL_TABLET | Freq: Three times a day (TID) | ORAL | Status: DC

## 2015-05-18 NOTE — Discharge Instructions (Signed)

## 2015-05-18 NOTE — ED Provider Notes (Signed)
CSN: [id number]     Arrival date & time 05/18/15  1442 History   First MD Initiated Contact with Patient 05/18/15 1502     Chief Complaint  Patient presents with  . Headache     (Consider location/radiation/quality/duration/timing/severity/associated sxs/prior Treatment) HPI Comments: Patient reports body aches, chills, headache, sore throat, sinus pressure, cough since May 8. Symptoms improved several days ago but are getting worse again. Denies any documented fevers. No chest pain or shortness of breath. She has a cough productive of yellow mucous. History she saw some streaks of blood in her sputum. She had one episode of vomiting after eating some seafood. she denies any chest pain or shortness of breath. She denies any  abdominal pain. She endorses sore throat,  and frontal sinus pressure, gradual onset headache, chills. She saw her PCP last week for told her she had a viral URI. He's been using nasal spray without relief. She denies any cardiac or pulmonary history. She had a negative stress test last year given T-wave inversions on her EKG. She has had sick contacts at work. Took a car trip to [place] last weekend.  The history is provided by the patient.    Past Medical History  Diagnosis Date  . Hypertension   . Obesity   . HSV (herpes simplex virus) infection   . Headache(784.0)   . Depression      on meds  . BV (bacterial vaginosis)     recent dx - finish med 12/22/11  . Insomnia     on meds  . Sleep apnea     occas. uses CPAP  . Anal fissure     history  . Arthritis     feet  . Migraines   . Angioedema of lips 03/21/2011    Presumed 2nd to ACE    Past Surgical History  Procedure Laterality Date  . Neck surgery  2001    cervical fusion C4/5  . Cesarean section      x 3  . Tubal ligation  1992  . Cholecystectomy    . Endometrial ablation    . Knee surgery  oct 2013    minesctomy and debridement   Family History  Problem Relation Age of Onset  . Diabetes  Mother   . Diabetes Father   . Diabetes Sister   . Cancer Paternal Aunt     cevical  . Cancer Paternal Aunt     cervical  . Heart attack Brother 28  . Heart failure Mother   . Hypertension Mother   . Stroke Mother    History  Substance Use Topics  . Smoking status: Never Smoker   . Smokeless tobacco: Never Used  . Alcohol Use: No     Comment: socially   OB History    Gravida Para Term Preterm AB TAB SAB Ectopic Multiple Living   3 3 3       3      Review of Systems  Constitutional: Positive for activity change, appetite change and fatigue. Negative for fever.  HENT: Positive for congestion and rhinorrhea.   Respiratory: Positive for cough. Negative for shortness of breath.   Cardiovascular: Negative for chest pain.  Gastrointestinal: Negative for nausea, vomiting and abdominal pain.  Genitourinary: Negative for dysuria, hematuria, vaginal bleeding and vaginal discharge.  Musculoskeletal: Positive for myalgias and arthralgias.  Skin: Negative for rash.  Neurological: Positive for light-headedness and headaches.  A complete 10 system review of systems was obtained and all systems are  negative except as noted in the HPI and PMH.      Allergies  Ace inhibitors; Angiotensin receptor blockers; and Latex  Home Medications   Prior to Admission medications   Medication Sig Start Date End Date Taking? Authorizing Provider  amLODipine (NORVASC) 5 MG tablet Take 1 tablet (5 mg total) by mouth daily. 05/24/14   Gerda Diss, DO  amoxicillin-clavulanate (AUGMENTIN) 500-125 MG per tablet Take 1 tablet (500 mg total) by mouth every 8 (eight) hours. 05/18/15   Ezequiel Essex, MD  cetirizine (ZYRTEC) 10 MG tablet Take 1 tablet (10 mg total) by mouth daily as needed for allergies. 07/22/13   Gerda Diss, DO  cyclobenzaprine (FLEXERIL) 10 MG tablet Take 1 tablet (10 mg total) by mouth 3 (three) times daily as needed for muscle spasms. 01/03/15   Mariel Aloe, MD  fluticasone  (FLONASE) 50 MCG/ACT nasal spray PLACE 2 SPRAYS INTO BOTH NOSTRILS DAILY. 09/18/14   Mariel Aloe, MD  furosemide (LASIX) 20 MG tablet Take 1 tablet (20 mg total) by mouth daily. 05/24/14   Gerda Diss, DO  hydrOXYzine (ATARAX/VISTARIL) 10 MG tablet Take 1 tablet (10 mg total) by mouth 3 (three) times daily as needed. 03/23/15   Mariel Aloe, MD  ipratropium (ATROVENT) 0.06 % nasal spray Place 2 sprays into both nostrils 4 (four) times daily. 05/10/15   Hilton Sinclair, MD  meloxicam (MOBIC) 15 MG tablet Take 1 tablet (15 mg total) by mouth daily. 03/28/15   Bryan R Hess, DO  montelukast (SINGULAIR) 10 MG tablet Take 1 tablet (10 mg total) by mouth at bedtime. 07/24/14   Mariel Aloe, MD  Ospemifene (OSPHENA) 60 MG TABS Take 60 mg by mouth daily. 02/23/15   Osborne Oman, MD  polyethylene glycol powder (GLYCOLAX/MIRALAX) powder Take 17 g by mouth daily as needed for moderate constipation or severe constipation. 04/06/15   Mariel Aloe, MD  topiramate (TOPAMAX) 50 MG tablet Take 1 tablet (50 mg total) by mouth 2 (two) times daily. 11/15/14   Mariel Aloe, MD  traMADol (ULTRAM) 50 MG tablet Take 1 tablet (50 mg total) by mouth every 8 (eight) hours as needed. 03/28/15   Tamela Oddi Hess, DO  tretinoin (RETIN-A) 0.025 % gel Apply topically at bedtime. 05/24/14   Gerda Diss, DO  triamcinolone (NASACORT AQ) 55 MCG/ACT AERO nasal inhaler Place 2 sprays into the nose daily. 07/24/14   Mariel Aloe, MD  triamterene-hydrochlorothiazide (DYAZIDE) 37.5-25 MG per capsule Take 1 each (1 capsule total) by mouth daily. 05/11/15   Mariel Aloe, MD  valACYclovir (VALTREX) 500 MG tablet Take 1 tablet (500 mg total) by mouth daily. 09/19/14   Mariel Aloe, MD  zolpidem (AMBIEN) 5 MG tablet Take 1 tablet (5 mg total) by mouth at bedtime as needed for sleep. 01/03/15   Mariel Aloe, MD   BP 146/101 mmHg  Pulse 106  Temp(Src) 99.2 F (37.3 C) (Oral)  Resp 18  Ht 5\' 1"  (1.549 m)  Wt 261 lb (118.389 kg)   BMI 49.34 kg/m2  SpO2 99% Physical Exam  Constitutional: She is oriented to person, place, and time. She appears well-developed and well-nourished. No distress.  HENT:  Head: Normocephalic and atraumatic.  Mouth/Throat: Oropharynx is clear and moist. No oropharyngeal exudate.  Maxillary sinus tenderness Erythematous tonsils with exudates, no asymmetry.  Eyes: Conjunctivae and EOM are normal. Pupils are equal, round, and reactive to light.  Neck: Normal range of  motion. Neck supple.  No meningismus.  Cardiovascular: Normal rate, normal heart sounds and intact distal pulses.   No murmur heard. Tachycardia to 100  Pulmonary/Chest: Effort normal and breath sounds normal. No respiratory distress. She has no wheezes.  Abdominal: Soft. There is no tenderness. There is no rebound and no guarding.  Musculoskeletal: Normal range of motion. She exhibits no edema or tenderness.  Neurological: She is alert and oriented to person, place, and time. No cranial nerve deficit. She exhibits normal muscle tone. Coordination normal.  No ataxia on finger to nose bilaterally. No pronator drift. 5/5 strength throughout. CN 2-12 intact. Negative Romberg. Equal grip strength. Sensation intact. Gait is normal.   Skin: Skin is warm.  Psychiatric: She has a normal mood and affect. Her behavior is normal.  Nursing note and vitals reviewed.   ED Course  Procedures (including critical care time) Labs Review Labs Reviewed  BASIC METABOLIC PANEL - Abnormal; Notable for the following:    Potassium 3.3 (*)    Glucose, Bld 103 (*)    All other components within normal limits  RAPID STREP SCREEN  CULTURE, GROUP A STREP  D-DIMER, QUANTITATIVE  TROPONIN I    Imaging Review Dg Chest 2 View  05/18/2015   CLINICAL DATA:  Headache and sore throat for 2 weeks.  Nonsmoker.  EXAM: CHEST  2 VIEW  COMPARISON:  05/16/2014, 06/16/2013  FINDINGS: Heart size is normal. The lungs are clear. Nodule previously question in the  right upper lobe is less apparent. There is no evidence for pulmonary edema. Surgical clips are noted in the right upper quadrant of the abdomen. Moderate mid thoracic spondylosis.  IMPRESSION: No evidence for acute  abnormality.   Electronically Signed   By: Nolon Nations M.D.   On: 05/18/2015 16:04     EKG Interpretation   Date/Time:  Friday May 18 2015 15:27:00 EDT Ventricular Rate:  88 PR Interval:  166 QRS Duration: 88 QT Interval:  364 QTC Calculation: 440 R Axis:   59 Text Interpretation:  Normal sinus rhythm T wave abnormality, consider  anterior ischemia Abnormal ECG stable T wave inversions Confirmed by  Wyvonnia Dusky  MD, Araceli Arango [phone]) on 05/18/2015 3:33:24 PM      MDM   Final diagnoses:  Acute maxillary sinusitis, recurrence not specified  Headache, unspecified headache type   headache, body aches, chills, sinus pressure and cough for the past 10 days. No fever. Cough productive of yellow mucus.  Gradual onset headache. Low suspicion for meningitis, SAH, temporal arteritis.  Chest x-ray negative. Rapid strep negative. EKG with T-wave inversions similar to previous. No chest pain or shortness of breath.  D-dimer obtained in setting of hemoptysis and tachycardia and car travel. This was negative. Doubt PE.   Treat for sinusitis/bronchitis given length of illness. Follow-up with PCP this week. Return precautions discussed.      Ezequiel Essex, MD 05/18/15 Tresa Moore

## 2015-05-18 NOTE — ED Notes (Signed)
Pt c/o headache since May 8th intermittent and body aches, chills and sinus pressure. Saw PCP last week. No improvement.

## 2015-05-21 ENCOUNTER — Other Ambulatory Visit: Payer: Self-pay | Admitting: Family Medicine

## 2015-05-21 LAB — CULTURE, GROUP A STREP: STREP A CULTURE: NEGATIVE

## 2015-05-21 NOTE — Telephone Encounter (Signed)
Jessica [name] is calling because she needs a refill on 3 medications; Triamcinolone, zolpidem (AMBIEN), and diclofenac sodium (VOLTAREN). Please call the patient when it is ready. Thank you, Fonda Kinder, ASA

## 2015-05-23 MED ORDER — TRIAMCINOLONE ACETONIDE 55 MCG/ACT NA AERO: 2.0000 | INHALATION_SPRAY | Freq: Every day | NASAL | Status: DC

## 2015-05-31 ENCOUNTER — Encounter: Payer: Self-pay | Admitting: Family Medicine

## 2015-05-31 ENCOUNTER — Ambulatory Visit (INDEPENDENT_AMBULATORY_CARE_PROVIDER_SITE_OTHER): Payer: Medicare Other | Admitting: Family Medicine

## 2015-05-31 VITALS — BP 151/95 | Temp 98.1°F | Ht 61.0 in | Wt 258.8 lb

## 2015-05-31 DIAGNOSIS — J0110 Acute frontal sinusitis, unspecified: Secondary | ICD-10-CM | POA: Diagnosis present

## 2015-05-31 DIAGNOSIS — J019 Acute sinusitis, unspecified: Secondary | ICD-10-CM | POA: Insufficient documentation

## 2015-05-31 MED ORDER — FLUCONAZOLE 150 MG PO TABS: 150.0000 mg | ORAL_TABLET | Freq: Once | ORAL | Status: DC

## 2015-05-31 MED ORDER — DOXYCYCLINE HYCLATE 100 MG PO TABS: 100.0000 mg | ORAL_TABLET | Freq: Two times a day (BID) | ORAL | Status: DC

## 2015-05-31 MED ORDER — FLUTICASONE PROPIONATE 50 MCG/ACT NA SUSP: 2.0000 | Freq: Every day | NASAL | Status: DC

## 2015-05-31 MED ORDER — GUAIFENESIN ER 600 MG PO TB12: 600.0000 mg | ORAL_TABLET | Freq: Two times a day (BID) | ORAL | Status: DC

## 2015-05-31 NOTE — Progress Notes (Signed)
Subjective:    Patient ID: Jessica [name], female    DOB: September 24, 1961, 54 y.o.   MRN: [id number]  HPI  Facial pressure/cough: Patient presents to family medicine, same-day appointment for 2 weeks history of sore throat, headache, ear pressure, facial pressure and cough. Patient states she was seen in the [facility] emergency room a few weeks ago for the same symptoms and was given amoxicillin 500 mg 3 times a day for 7 days. She states the amoxicillin gave her a yeast infection, and she does not feel that her symptoms improved. She feels they have gotten worse over the last week. Patient states that her sore throat hurts when she swallows, and in the early morning. Her headache is frontal in nature, with bilateral maxillary and frontal facial pain. Patient has had a mild productive cough, that is sometimes yellow-green with occasional blood streak. Patient denies any objective fever, but states that she has been warm, experienced chills, had a decreased appetite and has vomited twice in the last 24 hours. She denies any diarrhea. Patient was tested for strep throat at the emergency room, and it was negative. She also received a chest x-ray, which she stated was normal. She does have a history of allergic rhinitis and intermittent asthma, which she states she is only taking Zyrtec. She reports she used to be on Singulair but she has no longer been using that for some time. She states she's also had an albuterol inhaler in the past, but has only needed it when she was ill. 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  Nonsmoker Past Medical History  Diagnosis Date  . Hypertension   . Obesity   . HSV (herpes simplex virus) infection   . Headache(784.0)   . Depression      on meds  . BV (bacterial vaginosis)     recent dx - finish med 12/22/11  . Insomnia     on meds  . Sleep apnea     occas. uses CPAP    . Anal fissure     history  . Arthritis     feet  . Migraines   . Angioedema of lips 03/21/2011    Presumed 2nd to ACE    Allergies  Allergen Reactions  . Ace Inhibitors     Lip swelling with lisinopril  . Angiotensin Receptor Blockers     Lip swelling with ACE  . Latex Itching and Rash     Review of Systems Per history of present illness    Objective:   Physical Exam BP 151/95 mmHg  Temp(Src) 98.1 F (36.7 C) (Oral)  Ht 5\' 1"  (1.549 m)  Wt 258 lb 12.8 oz (117.391 kg)  BMI 48.93 kg/m2 Gen: NAD. Nontoxic in appearance, appears fatigued. Mild cough while in room. Cooperative with exam, makes good eye contact, African-American obese female, no hoarseness noted. HEENT: AT. [place]. Bilateral TM visualized shiny/fluid-filled. Eyes without drainage, injections or icterus. MMM. Bilateral nares with severe erythema, no swelling. Throat with mild erythema, no exudates, cobblestoning present. Facial tenderness bilateral maxillary sinus. Neck: Supple, mild cervical anterior lymphadenopathy  CV: RRR no murmurs appreciated Chest: CTAB, no wheeze or crackles Abd: Soft. Obese. NTND. BS present. No Masses palpated.   Skin: No rashes, purpura or petechiae.     Assessment & Plan:  Jessica [name] is 55 y.o. AAF with a greater than 2 week  history of bronchitis with maxillary sinusitis.  - Patient with acute sinusitis, despite partial use of amoxicillin about a colostomy. - Discussed the futility of Flonase daily, for the next 1-2 months.  - Mucinex use for the next 48-72 hours to help clear secretions. - Doxycycline 100 grams twice a day for the next 10 days. - Diflucan 1 tab prescribed, in the event she has another yeast infection to this anti-biotic. - Rest, fluids, Motrin/Tylenol for headache, fevers or arthralgias. - Discussed the chronic nature of cough after sinusitis/bronchitis and that it can last for a few weeks. - No red flags on exam today, lung exam was normal. No concern for strep  throat. - Follow-up in 2-4 weeks if no improvement.

## 2015-05-31 NOTE — Patient Instructions (Signed)

## 2015-05-31 NOTE — Assessment & Plan Note (Signed)
Jessica [name] is 54 y.o. AAF with a greater than 2 week history of bronchitis with maxillary sinusitis.  - Patient with acute sinusitis, despite partial use of amoxicillin about a colostomy. - Discussed the futility of Flonase daily, for the next 1-2 months.  - Mucinex use for the next 48-72 hours to help clear secretions. - Doxycycline 100 grams twice a day for the next 10 days. - Diflucan 1 tab prescribed, in the event she has another yeast infection to this anti-biotic. - Rest, fluids, Motrin/Tylenol for headache, fevers or arthralgias. - Discussed the chronic nature of cough after sinusitis/bronchitis and that it can last for a few weeks. - No red flags on exam today, lung exam was normal. No concern for strep throat. - Follow-up in 2-4 weeks if no improvement.

## 2015-06-01 ENCOUNTER — Telehealth: Payer: Self-pay | Admitting: *Deleted

## 2015-06-01 ENCOUNTER — Other Ambulatory Visit: Payer: Self-pay | Admitting: Family Medicine

## 2015-06-01 DIAGNOSIS — J0110 Acute frontal sinusitis, unspecified: Secondary | ICD-10-CM

## 2015-06-01 MED ORDER — GUAIFENESIN ER 600 MG PO TB12: 600.0000 mg | ORAL_TABLET | Freq: Two times a day (BID) | ORAL | Status: DC

## 2015-06-01 MED ORDER — FLUCONAZOLE 150 MG PO TABS: 150.0000 mg | ORAL_TABLET | Freq: Once | ORAL | Status: DC

## 2015-06-01 MED ORDER — DOXYCYCLINE HYCLATE 100 MG PO TABS: 100.0000 mg | ORAL_TABLET | Freq: Two times a day (BID) | ORAL | Status: DC

## 2015-06-01 MED ORDER — FLUTICASONE PROPIONATE 50 MCG/ACT NA SUSP: 2.0000 | Freq: Every day | NASAL | Status: DC

## 2015-06-01 NOTE — Telephone Encounter (Signed)
Pt called to check on Rx that was supposed to be sent electronically.  Resent Rx will see if transmits, if not will contact pharmacy.  Katharina Caper, Matelyn Antonelli D, [place]

## 2015-06-01 NOTE — Telephone Encounter (Signed)
Rx did not go through so I contacted pharmacy and called in the Rx's. Katharina Caper, April D, [place]

## 2015-06-08 ENCOUNTER — Ambulatory Visit (INDEPENDENT_AMBULATORY_CARE_PROVIDER_SITE_OTHER): Payer: Medicare Other | Admitting: Obstetrics & Gynecology

## 2015-06-08 ENCOUNTER — Encounter: Payer: Self-pay | Admitting: Obstetrics & Gynecology

## 2015-06-08 VITALS — BP 183/84 | HR 77 | Ht 61.0 in | Wt 268.0 lb

## 2015-06-08 DIAGNOSIS — Z113 Encounter for screening for infections with a predominantly sexual mode of transmission: Secondary | ICD-10-CM | POA: Diagnosis not present

## 2015-06-08 DIAGNOSIS — Z0000 Encounter for general adult medical examination without abnormal findings: Secondary | ICD-10-CM

## 2015-06-08 DIAGNOSIS — Z7251 High risk heterosexual behavior: Secondary | ICD-10-CM | POA: Diagnosis not present

## 2015-06-08 DIAGNOSIS — N898 Other specified noninflammatory disorders of vagina: Secondary | ICD-10-CM

## 2015-06-08 NOTE — Progress Notes (Signed)
   Subjective:    Patient ID: Jessica [name], female    DOB: 11-08-1961, 54 y.o.   MRN: [id number]  HPI   54 yo S AA P3 here today with the issue of vaginal itching and odor. She tried OTC Monistat and douching with no relief. She usually uses a condom with her long term boyfriend but last month she did not use one. She is not sure if he is monogamous.   Review of Systems     Objective:   Physical Exam Obese BF NAD Breathing and ambulating normally Vaginal discharge c/w BV Bimanual exam reveals no masses or tenderness       Assessment & Plan:  STI testing per her request Vaginal discharge- check cervical cultures and wet prep

## 2015-06-09 LAB — GC/CHLAMYDIA PROBE AMP
CT Probe RNA: NEGATIVE
GC PROBE AMP APTIMA: NEGATIVE

## 2015-06-09 LAB — HIV ANTIBODY (ROUTINE TESTING W REFLEX): HIV 1&2 Ab, 4th Generation: NONREACTIVE

## 2015-06-09 LAB — RPR

## 2015-06-09 LAB — WET PREP, GENITAL
Trich, Wet Prep: NONE SEEN
WBC, Wet Prep HPF POC: NONE SEEN
Yeast Wet Prep HPF POC: NONE SEEN

## 2015-06-09 LAB — HEPATITIS C ANTIBODY: HCV Ab: NEGATIVE

## 2015-06-09 LAB — HEPATITIS B SURFACE ANTIGEN: HEP B S AG: NEGATIVE

## 2015-06-11 ENCOUNTER — Telehealth: Payer: Self-pay | Admitting: *Deleted

## 2015-06-11 DIAGNOSIS — N760 Acute vaginitis: Principal | ICD-10-CM

## 2015-06-11 DIAGNOSIS — B9689 Other specified bacterial agents as the cause of diseases classified elsewhere: Secondary | ICD-10-CM

## 2015-06-11 MED ORDER — METRONIDAZOLE 500 MG PO TABS: 500.0000 mg | ORAL_TABLET | Freq: Two times a day (BID) | ORAL | Status: DC

## 2015-06-11 NOTE — Telephone Encounter (Signed)
Patient called regarding her test results and need for an antibiotic for bacterial vaginosis to be sent in.  I have sent in Flagyl to her pharmacy

## 2015-06-29 ENCOUNTER — Telehealth: Payer: Self-pay | Admitting: Family Medicine

## 2015-06-29 ENCOUNTER — Other Ambulatory Visit: Payer: Self-pay | Admitting: *Deleted

## 2015-06-29 DIAGNOSIS — B9689 Other specified bacterial agents as the cause of diseases classified elsewhere: Secondary | ICD-10-CM

## 2015-06-29 DIAGNOSIS — N760 Acute vaginitis: Principal | ICD-10-CM

## 2015-06-29 DIAGNOSIS — B001 Herpesviral vesicular dermatitis: Secondary | ICD-10-CM

## 2015-06-29 MED ORDER — METRONIDAZOLE 500 MG PO TABS: 500.0000 mg | ORAL_TABLET | Freq: Two times a day (BID) | ORAL | Status: DC

## 2015-06-29 NOTE — Telephone Encounter (Signed)
Patient called, stated she was recently treated for BV, but took a tub bath and now has intense vaginal itching.  She would like something called in.  Flagyl called in again per protocol.

## 2015-07-09 ENCOUNTER — Ambulatory Visit (INDEPENDENT_AMBULATORY_CARE_PROVIDER_SITE_OTHER): Payer: Medicare Other | Admitting: Family Medicine

## 2015-07-09 ENCOUNTER — Encounter: Payer: Self-pay | Admitting: Family Medicine

## 2015-07-09 VITALS — BP 139/73 | HR 79 | Temp 98.6°F | Ht 61.0 in | Wt 267.0 lb

## 2015-07-09 DIAGNOSIS — R7309 Other abnormal glucose: Secondary | ICD-10-CM | POA: Diagnosis not present

## 2015-07-09 DIAGNOSIS — M23303 Other meniscus derangements, unspecified medial meniscus, right knee: Secondary | ICD-10-CM

## 2015-07-09 DIAGNOSIS — R358 Other polyuria: Secondary | ICD-10-CM

## 2015-07-09 DIAGNOSIS — R3589 Other polyuria: Secondary | ICD-10-CM

## 2015-07-09 DIAGNOSIS — F331 Major depressive disorder, recurrent, moderate: Secondary | ICD-10-CM

## 2015-07-09 DIAGNOSIS — R739 Hyperglycemia, unspecified: Secondary | ICD-10-CM

## 2015-07-09 LAB — POCT URINALYSIS DIPSTICK
Bilirubin, UA: NEGATIVE
Blood, UA: NEGATIVE
Glucose, UA: NEGATIVE
KETONES UA: NEGATIVE
Leukocytes, UA: NEGATIVE
Nitrite, UA: NEGATIVE
PH UA: 7
Protein, UA: NEGATIVE
Spec Grav, UA: 1.02
UROBILINOGEN UA: 0.2

## 2015-07-09 LAB — POCT GLYCOSYLATED HEMOGLOBIN (HGB A1C): Hemoglobin A1C: 5.5

## 2015-07-09 MED ORDER — DICLOFENAC SODIUM 1 % TD GEL: 2.0000 g | Freq: Four times a day (QID) | TRANSDERMAL | Status: DC

## 2015-07-09 MED ORDER — BUPROPION HCL ER (XL) 150 MG PO TB24: 150.0000 mg | ORAL_TABLET | Freq: Every day | ORAL | Status: DC

## 2015-07-09 NOTE — Progress Notes (Addendum)
    Subjective    Jessica [name] is a 54 y.o. female that presents for a follow-up visit for chronic issues.   1. Knee: Chronic issue. She has locking with frequent falls and leg giving out. She has been able to catch herself at times. She has hit her head before with these falls. Her last fall was two weeks ago where she fell against a wall when getting out of the shower.  2. Polyuria: Symptoms have been going on for two months. This has been causing her to not be able to sleep during the night. She has also not been using her CPAP. Her urine has been clear with no gross hematuria or frothing. She has associated polyuria secondary to dry mouth. No dysuria. She has intermittent sharp, right flank pain.  3. Depression: Was previously on Wellbutrin XL 300mg  daily. She reports this regimen worked for her but she has not had this in months. She reports depressed mood with increased appetite. She sometimes does not feel like interacting with people.   4. Sleep apnea: patient compliant with CPAP machine up until about a month ago due to concerns of needing a filter change and not wanting to get sick. Prior to that, patient reports significant improvement in function when able to use CPAP. She would use it for 5-6 hours during the night every day of the week and hopes to be able to restart soon.  History  Substance Use Topics  . Smoking status: Never Smoker   . Smokeless tobacco: Never Used  . Alcohol Use: No     Comment: socially    Allergies  Allergen Reactions  . Ace Inhibitors     Lip swelling with lisinopril  . Angiotensin Receptor Blockers     Lip swelling with ACE  . Latex Itching and Rash    No orders of the defined types were placed in this encounter.    ROS  Per HPI   Objective   BP 139/73 mmHg  Pulse 79  Temp(Src) 98.6 F (37 C) (Oral)  Ht 5\' 1"  (1.549 m)  Wt 267 lb (121.11 kg)  BMI 50.48 kg/m2  General: Well appearing, no distress Musculoskeletal: Right  knee  Observation: Appears normal, no deformities  ROM: Full range of motion  Tenderness to palpation: along medial joint line  Crepitus on knee extension, negative valgus/varus. +McMurray, patient with pain on squat movement Neuro: Alert, oriented, no distress  Assessment and Plan   Please refer to problem based charting of assessment and plan

## 2015-07-09 NOTE — Patient Instructions (Signed)
Thank you for coming to see me today. It was a pleasure. Today we talked about:   Depression: I am restarting your Wellbutrin XL. Your insurance may request a prior authorization which I will submit for you  Increased urination: I am checking your urine today. I will call you with results and further plans for treatment.  Knee pain/catching/giving out: I am referring you to orthopedic surgery.   Please make an appointment to see me in 2-4 weeks for follow-up.  If you have any questions or concerns, please do not hesitate to call the office at [phone].  Sincerely,  Cordelia Poche, MD

## 2015-07-10 NOTE — Assessment & Plan Note (Signed)
Persisting. Possibly psychogenic, however, patient is complaining of dry mouth as well. A1C normal. Urinalysis looks unremarkable. Tried to obtain urine sodium and creatinine, however, appears this was not collected. Will have patient redo these labs when she returns for follow-up

## 2015-07-10 NOTE — Assessment & Plan Note (Signed)
Patient without suicidal ideation. She has been off of her Wellbutrin. Will restart at reduced dose and work up to maintenance dose of 300mg  daily. Follow-up closely.

## 2015-07-10 NOTE — Assessment & Plan Note (Signed)
This is a chronic issue. Previously managed by Dr. [name]. Patient worsening with increasing frequency of falls. This is very worrisome and I think she needs to follow-up with orthopedic surgery sooner rather than later for surgical management of her knee. Red flags discussed with patient.

## 2015-07-15 ENCOUNTER — Encounter: Payer: Self-pay | Admitting: Family Medicine

## 2015-07-16 DIAGNOSIS — M1710 Unilateral primary osteoarthritis, unspecified knee: Secondary | ICD-10-CM | POA: Diagnosis not present

## 2015-07-19 ENCOUNTER — Ambulatory Visit: Payer: Medicare Other | Admitting: Family Medicine

## 2015-07-23 ENCOUNTER — Ambulatory Visit (INDEPENDENT_AMBULATORY_CARE_PROVIDER_SITE_OTHER): Payer: Medicare Other | Admitting: Family Medicine

## 2015-07-23 VITALS — BP 147/83 | Temp 98.3°F | Ht 61.0 in | Wt 263.7 lb

## 2015-07-23 DIAGNOSIS — B001 Herpesviral vesicular dermatitis: Secondary | ICD-10-CM | POA: Diagnosis not present

## 2015-07-23 DIAGNOSIS — J0110 Acute frontal sinusitis, unspecified: Secondary | ICD-10-CM | POA: Diagnosis not present

## 2015-07-23 DIAGNOSIS — Z6841 Body Mass Index (BMI) 40.0 and over, adult: Secondary | ICD-10-CM | POA: Diagnosis not present

## 2015-07-23 DIAGNOSIS — Z0000 Encounter for general adult medical examination without abnormal findings: Secondary | ICD-10-CM

## 2015-07-23 DIAGNOSIS — I10 Essential (primary) hypertension: Secondary | ICD-10-CM

## 2015-07-23 LAB — COMPLETE METABOLIC PANEL WITH GFR
ALBUMIN: 4.1 g/dL (ref 3.6–5.1)
ALK PHOS: 94 U/L (ref 33–130)
ALT: 16 U/L (ref 6–29)
AST: 18 U/L (ref 10–35)
BUN: 10 mg/dL (ref 7–25)
CHLORIDE: 105 mmol/L (ref 98–110)
CO2: 24 mmol/L (ref 20–31)
Calcium: 9.4 mg/dL (ref 8.6–10.4)
Creat: 0.68 mg/dL (ref 0.50–1.05)
GFR, Est Non African American: >89 mL/min (ref >=60)
Glucose, Bld: 99 mg/dL (ref 65–99)
Potassium: 3.5 mmol/L (ref 3.5–5.3)
Sodium: 141 mmol/L (ref 135–146)
TOTAL PROTEIN: 8 g/dL (ref 6.1–8.1)
Total Bilirubin: 0.3 mg/dL (ref 0.2–1.2)

## 2015-07-23 LAB — CBC WITH DIFFERENTIAL/PLATELET
BASOS ABS: 0 10*3/uL (ref 0.0–0.1)
Basophils Relative: 0 % (ref 0–1)
Eosinophils Absolute: 0.1 10*3/uL (ref 0.0–0.7)
Eosinophils Relative: 1 % (ref 0–5)
HCT: 40.1 % (ref 36.0–46.0)
Hemoglobin: 14 g/dL (ref 12.0–15.0)
LYMPHS PCT: 40 % (ref 12–46)
Lymphs Abs: 2.8 10*3/uL (ref 0.7–4.0)
MCH: 31 pg (ref 26.0–34.0)
MCHC: 34.9 g/dL (ref 30.0–36.0)
MCV: 88.9 fL (ref 78.0–100.0)
MPV: 9.3 fL (ref 8.6–12.4)
Monocytes Absolute: 0.4 10*3/uL (ref 0.1–1.0)
Monocytes Relative: 6 % (ref 3–12)
Neutro Abs: 3.7 10*3/uL (ref 1.7–7.7)
Neutrophils Relative %: 53 % (ref 43–77)
Platelets: 407 10*3/uL — ABNORMAL HIGH (ref 150–400)
RBC: 4.51 MIL/uL (ref 3.87–5.11)
RDW: 14.3 % (ref 11.5–15.5)
WBC: 7 10*3/uL (ref 4.0–10.5)

## 2015-07-23 LAB — TSH: TSH: 1.321 u[IU]/mL (ref 0.350–4.500)

## 2015-07-23 MED ORDER — GUAIFENESIN ER 600 MG PO TB12: 600.0000 mg | ORAL_TABLET | Freq: Two times a day (BID) | ORAL | Status: DC

## 2015-07-23 MED ORDER — TRIAMTERENE-HCTZ 37.5-25 MG PO CAPS: 1.0000 | ORAL_CAPSULE | Freq: Every day | ORAL | Status: DC

## 2015-07-23 MED ORDER — AMLODIPINE BESYLATE 10 MG PO TABS: 5.0000 mg | ORAL_TABLET | Freq: Every day | ORAL | Status: DC

## 2015-07-23 NOTE — Progress Notes (Signed)
Subjective    Jessica [name] is a 54 y.o. female that presents for an office visit.   Concern:  1. Weight: She has cut out sweets and sodas from her diet. She is walking about 1/2 mile every other day. Yesterday, she had toast and coffee for breakfast, skinless chicken thighs with yogurt, a peach and salad lunch with one cup of lemonade and water throughout the day; no snacks  Past Medical History  Diagnosis Date  . Hypertension   . Obesity   . HSV (herpes simplex virus) infection   . Headache(784.0)   . Depression      on meds  . BV (bacterial vaginosis)     recent dx - finish med 12/22/11  . Insomnia     on meds  . Sleep apnea     occas. uses CPAP  . Anal fissure     history  . Arthritis     feet  . Migraines   . Angioedema of lips 03/21/2011    Presumed 2nd to ACE     Past Surgical History  Procedure Laterality Date  . Neck surgery  2001    cervical fusion C4/5  . Cesarean section      x 3  . Tubal ligation  1992  . Cholecystectomy    . Endometrial ablation    . Knee surgery  oct 2013    minesctomy and debridement    Current Outpatient Prescriptions on File Prior to Visit  Medication Sig Dispense Refill  . amLODipine (NORVASC) 5 MG tablet Take 1 tablet (5 mg total) by mouth daily. 90 tablet 3  . amoxicillin-clavulanate (AUGMENTIN) 500-125 MG per tablet Take 1 tablet (500 mg total) by mouth every 8 (eight) hours. 21 tablet 0  . buPROPion (WELLBUTRIN XL) 150 MG 24 hr tablet Take 1 tablet (150 mg total) by mouth daily. 30 tablet 2  . cetirizine (ZYRTEC) 10 MG tablet Take 1 tablet (10 mg total) by mouth daily as needed for allergies. 30 tablet 5  . cyclobenzaprine (FLEXERIL) 10 MG tablet Take 1 tablet (10 mg total) by mouth 3 (three) times daily as needed for muscle spasms. 30 tablet 0  . diclofenac sodium (VOLTAREN) 1 % GEL Apply 2 g topically 4 (four) times daily. 1 Tube 0  . doxycycline (VIBRA-TABS) 100 MG tablet Take 1 tablet (100 mg total) by mouth 2 (two)  times daily. 20 tablet 0  . fluconazole (DIFLUCAN) 150 MG tablet Take 1 tablet (150 mg total) by mouth once. Take 1 tab PO once if you experience a yeast infection with antibiotic use. 1 tablet 0  . fluticasone (FLONASE) 50 MCG/ACT nasal spray PLACE 2 SPRAYS INTO BOTH NOSTRILS DAILY. 16 g 3  . fluticasone (FLONASE) 50 MCG/ACT nasal spray Place 2 sprays into both nostrils daily. 16 g 6  . furosemide (LASIX) 20 MG tablet Take 1 tablet (20 mg total) by mouth daily. 90 tablet 1  . guaiFENesin (MUCINEX) 600 MG 12 hr tablet Take 1 tablet (600 mg total) by mouth 2 (two) times daily. 60 tablet 0  . hydrOXYzine (ATARAX/VISTARIL) 10 MG tablet Take 1 tablet (10 mg total) by mouth 3 (three) times daily as needed. 30 tablet 2  . ipratropium (ATROVENT) 0.06 % nasal spray Place 2 sprays into both nostrils 4 (four) times daily. 15 mL 0  . meloxicam (MOBIC) 15 MG tablet Take 1 tablet (15 mg total) by mouth daily. 30 tablet 1  . metroNIDAZOLE (FLAGYL) 500 MG tablet  Take 1 tablet (500 mg total) by mouth 2 (two) times daily. 14 tablet 0  . montelukast (SINGULAIR) 10 MG tablet Take 1 tablet (10 mg total) by mouth at bedtime. 30 tablet 5  . Ospemifene (OSPHENA) 60 MG TABS Take 60 mg by mouth daily. 30 tablet 5  . polyethylene glycol powder (GLYCOLAX/MIRALAX) powder Take 17 g by mouth daily as needed for moderate constipation or severe constipation. 527 g 1  . topiramate (TOPAMAX) 50 MG tablet Take 1 tablet (50 mg total) by mouth 2 (two) times daily. 180 tablet 1  . traMADol (ULTRAM) 50 MG tablet Take 1 tablet (50 mg total) by mouth every 8 (eight) hours as needed. 60 tablet 1  . tretinoin (RETIN-A) 0.025 % gel Apply topically at bedtime. 45 g 0  . triamcinolone (NASACORT AQ) 55 MCG/ACT AERO nasal inhaler Place 2 sprays into the nose daily. 1 Inhaler 0  . triamterene-hydrochlorothiazide (DYAZIDE) 37.5-25 MG per capsule Take 1 each (1 capsule total) by mouth daily. 90 capsule 1  . valACYclovir (VALTREX) 500 MG tablet Take  1 tablet (500 mg total) by mouth daily. 30 tablet 5  . zolpidem (AMBIEN) 5 MG tablet Take 1 tablet (5 mg total) by mouth at bedtime as needed for sleep. 30 tablet 0   No current facility-administered medications on file prior to visit.    Allergies  Allergen Reactions  . Ace Inhibitors     Lip swelling with lisinopril  . Angiotensin Receptor Blockers     Lip swelling with ACE  . Latex Itching and Rash    History   Social History  . Marital Status: Divorced    Spouse Name: N/A  . Number of Children: N/A  . Years of Education: N/A   Social History Main Topics  . Smoking status: Never Smoker   . Smokeless tobacco: Never Used  . Alcohol Use: No     Comment: socially  . Drug Use: No  . Sexual Activity: Not on file     Comment: tubaligation   Other Topics Concern  . Not on file   Social History Narrative   December 14, 2012 patient recently lost her second job. She  just got custody of her 11 year old niece. She is concerned about finances.   03/22/12: Niece is living with her.  Niece is doing better, but still struggling in school.    Family History  Problem Relation Age of Onset  . Diabetes Mother   . Diabetes Father   . Diabetes Sister   . Cancer Paternal Aunt     cevical  . Cancer Paternal Aunt     cervical  . Heart attack Brother 58  . Heart failure Mother   . Hypertension Mother   . Stroke Mother     ROS  Per HPI   Objective   BP 147/83 mmHg  Temp(Src) 98.3 F (36.8 C) (Oral)  Ht 5\' 1"  (1.549 m)  Wt 263 lb 11.2 oz (119.614 kg)  BMI 49.85 kg/m2  General: Well appearing, no distress Respiratory/Chest: No distress Gastrointestinal: Obese Genitourinary: Not examined    Musculoskeletal: no obvious deformities Neuro: Alert, oriented, CN grossly intact Dermatologic: No obvious visible rashes Psychiatric: Normal affect  No orders of the defined types were placed in this encounter.    Assessment and Plan    1. Obesity - refer for bariatric  surgery  2. Health maintenance - CBC, CMET, TSH - up to date with pap smear and colonoscopy

## 2015-07-23 NOTE — Patient Instructions (Signed)
Thank you for coming to see me today. It was a pleasure. Today we talked about:   Valcyclovir: I am making this for you to pick up for breakouts. If you start having increased amounts of breakouts, we may restart daily treatment.  Hypertension: I am increasing your amlodipine to 10mg  daily  Weight: I am placing a referral for bariatric surgery  Please make an appointment to see me in 3 months for follow-up.  If you have any questions or concerns, please do not hesitate to call the office at [phone].  Sincerely,  Cordelia Poche, MD

## 2015-07-29 ENCOUNTER — Encounter: Payer: Self-pay | Admitting: Family Medicine

## 2015-09-26 ENCOUNTER — Ambulatory Visit (INDEPENDENT_AMBULATORY_CARE_PROVIDER_SITE_OTHER): Payer: Medicare Other | Admitting: Family Medicine

## 2015-09-26 ENCOUNTER — Telehealth: Payer: Self-pay | Admitting: Family Medicine

## 2015-09-26 ENCOUNTER — Encounter: Payer: Self-pay | Admitting: Family Medicine

## 2015-09-26 VITALS — BP 138/89 | HR 77 | Temp 98.2°F | Ht 61.0 in | Wt 265.8 lb

## 2015-09-26 DIAGNOSIS — M25562 Pain in left knee: Secondary | ICD-10-CM

## 2015-09-26 MED ORDER — OXYCODONE-ACETAMINOPHEN 5-325 MG PO TABS: 1.0000 | ORAL_TABLET | Freq: Four times a day (QID) | ORAL | Status: DC | PRN

## 2015-09-26 NOTE — Telephone Encounter (Signed)
[facility].

## 2015-09-26 NOTE — Patient Instructions (Addendum)
Thank you so much for coming to visit me today! We will get an MRI of your knee. I have also sent in a referral to Physical Therapy and a referral to Orthopedics. We will give you an ACE wrap so you can put compression on your knee. I will also send in a prescription for Percocet. Please continue to ice your knee and keep it elevated.  Thanks again! Dr. Gerlean Ren

## 2015-09-27 NOTE — Assessment & Plan Note (Signed)
-   Acute left knee pain following trauma on 9/18 with positive McMurrays on left - MRI left knee - ACE wrap given for compression - Recommend Icing - Prescription for Percocet given. - Referral to PT - Referral to Orthopedics for second opinion - Follow up pending MRI results.

## 2015-09-27 NOTE — Progress Notes (Signed)
Subjective:     Patient ID: Jessica [name], female   DOB: [birth year], 54 y.o.   MRN: [id number]  HPI [name] is a 54yo female presenting today for acute left knee pain. - History of chronic right knee pain noted. Previously seen by orthopedics, who recommend weight loss prior to surgery. Would like second opinion. - States that on 09/16/15 she was standing on a metal chair. Her right knee gave out and she hit her left knee on the chair. - Knee was initially painful, swollen, and bruised. Bruising resolved. Pain unchanged. Edema intermittent, last occurred yesterday. - Pain located on medial and anterior knee - Can ambulate with difficulty. Uses cane. - Knee has been giving out, popping, cracking, which are new symptoms for left knee but chronically noted in right - Has trouble sleeping at night due to pain in knee - Has not been wearing a brace. States her leg is too large for brace. - Has been icing without relief - Has tried Voltaren, Aleve, and Ultram without relief - Denies ankle or hip pain  Review of Systems Per HPI    Objective:   Physical Exam  Constitutional: She appears well-developed and well-nourished. No distress.  Cardiovascular: Normal rate and regular rhythm.  Exam reveals no gallop and no friction rub.   No murmur heard. Pulmonary/Chest: Effort normal. No respiratory distress. She has no wheezes. She has no rales.  Musculoskeletal:  Edema of left knee noted. Diffuse tenderness over knee. Negative Lachman's bilaterally. Negative McMurray's on right knee. Popping and pain noted with McMurray's on left. Crepitus noted bilaterally in knees, right worse than left. Normal ROM of hips. Normal ROM of ankles bilaterally with normal laxity bilaterally.        Assessment and Plan:     Left knee pain - Acute left knee pain following trauma on 9/18 with positive McMurrays on left - MRI left knee - ACE wrap given for compression - Recommend Icing - Prescription for  Percocet given. - Referral to PT - Referral to Orthopedics for second opinion - Follow up pending MRI results.

## 2015-10-05 ENCOUNTER — Ambulatory Visit (HOSPITAL_COMMUNITY): Admission: RE | Admit: 2015-10-05 | Payer: Medicare Other | Source: Ambulatory Visit

## 2015-10-10 ENCOUNTER — Telehealth: Payer: Self-pay | Admitting: Family Medicine

## 2015-10-10 NOTE — Telephone Encounter (Signed)
Ms. Maultsby is calling to ask for a rx to get medication for anxiety due to having to do the full MRI procedure.  Have issues with being closed in.

## 2015-10-15 ENCOUNTER — Ambulatory Visit: Payer: Medicare Other | Attending: Family Medicine

## 2015-10-15 DIAGNOSIS — R262 Difficulty in walking, not elsewhere classified: Secondary | ICD-10-CM | POA: Diagnosis not present

## 2015-10-15 DIAGNOSIS — M25562 Pain in left knee: Secondary | ICD-10-CM | POA: Insufficient documentation

## 2015-10-15 DIAGNOSIS — M25561 Pain in right knee: Secondary | ICD-10-CM | POA: Diagnosis not present

## 2015-10-15 DIAGNOSIS — M25669 Stiffness of unspecified knee, not elsewhere classified: Secondary | ICD-10-CM | POA: Insufficient documentation

## 2015-10-15 DIAGNOSIS — R6889 Other general symptoms and signs: Secondary | ICD-10-CM | POA: Insufficient documentation

## 2015-10-15 MED ORDER — LORAZEPAM 1 MG PO TABS: 1.0000 mg | ORAL_TABLET | Freq: Once | ORAL | Status: DC

## 2015-10-15 NOTE — Telephone Encounter (Signed)
Prescription for ativan 1mg  x1 to use before MRI procedure written and left up front for pick up.

## 2015-10-15 NOTE — Telephone Encounter (Signed)
Pt returned call and was informed of message. jw

## 2015-10-15 NOTE — Therapy (Signed)
Jessica [name], [place], [address] Phone: [phone]   Fax:  [phone]  Physical Therapy Evaluation  Patient Details  Name: Jessica [name] MRN: [id number] Date of Birth: 1961/05/28 Referring Provider: Aviva Kluver  Encounter Date: 10/15/2015      PT End of Session - 10/15/15 1021    Visit Number 1   Date for PT Re-Evaluation 11/26/15   Authorization Type Medicare   PT Start Time 0925   PT Stop Time 1010   PT Time Calculation (min) 45 min   Activity Tolerance Patient tolerated treatment well   Behavior During Therapy [facility] for tasks assessed/performed      Past Medical History  Diagnosis Date  . Hypertension   . Obesity   . HSV (herpes simplex virus) infection   . Headache(784.0)   . Depression      on meds  . BV (bacterial vaginosis)     recent dx - finish med 12/22/11  . Insomnia     on meds  . Sleep apnea     occas. uses CPAP  . Anal fissure     history  . Arthritis     feet  . Migraines   . Angioedema of lips 03/21/2011    Presumed 2nd to ACE     Past Surgical History  Procedure Laterality Date  . Neck surgery  2001    cervical fusion C4/5  . Cesarean section      x 3  . Tubal ligation  1992  . Cholecystectomy    . Endometrial ablation    . Knee surgery  oct 2013    minesctomy and debridement    There were no vitals filed for this visit.  Visit Diagnosis:  Arthralgia of both knees - Plan: PT plan of care cert/re-cert  Stiffness of knee joint, unspecified laterality - Plan: PT plan of care cert/re-cert  Difficulty walking - Plan: PT plan of care cert/re-cert  Activity intolerance - Plan: PT plan of care cert/re-cert      Subjective Assessment - 10/15/15 0925    Subjective LT knee pain after hitting knee on chair. Pain edema amd popping noted now post injury. Last 2 days pain improved   Pertinent History Pt with history of chronic RT knee pain and now with LT knee  pain after she was standing on chair and RT knee gave hitting LT knee on chair. Uses home exercise bike at home    Limitations Walking;Standing  transitions to stand and walk from sitting   How long can you sit comfortably? As needed   How long can you stand comfortably? RT knee limits from 15-20 min   How long can you walk comfortably? 20-25 min at best.    Diagnostic tests MRI ordered   Patient Stated Goals Improve knee pain to improve mobility   Currently in Pain? Yes   Pain Score 2   RT knee 6/10 This is normal   Pain Location Knee   Pain Orientation Left   Pain Type Acute pain   Pain Onset 1 to 4 weeks ago   Pain Frequency Constant   Aggravating Factors  weight bearing activity   Pain Relieving Factors cold, heat, volatrin cream, meds   Multiple Pain Sites --  RT and LT knee.            [facility] PT Assessment - 10/15/15 1062    Assessment   Medical Diagnosis LT knee pain   Referring Provider Raliegh  Rumley   Onset Date/Surgical Date 09/16/15   Next MD Visit PRN   Prior Therapy No for LT knee   Precautions   Precautions None   Restrictions   Weight Bearing Restrictions No   Balance Screen   Has the patient fallen in the past 6 months Yes   How many times? 3  RT knee gives out   Has the patient had a decrease in activity level because of a fear of falling?  Yes  due to pain   Is the patient reluctant to leave their home because of a fear of falling?  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, No physical activity other than assit for feeding    Cognition   Overall Cognitive Status Within Functional Limits for tasks assessed   Observation/Other Assessments   Focus on Therapeutic Outcomes (FOTO)  71%   ROM / Strength   AROM / PROM / Strength AROM;Strength   AROM   AROM  Assessment Site Knee   Right/Left Knee Right;Left   Right Knee Extension -5   Right Knee Flexion 120   Left Knee Extension 5   Left Knee Flexion 120   Strength   Strength Assessment Site Knee   Right/Left Knee Right;Left   Right Knee Flexion 5/5   Right Knee Extension 5/5   Left Knee Flexion 5/5   Left Knee Extension 5/5   Flexibility   Soft Tissue Assessment /Muscle Length yes   Hamstrings 60 degrees RT 70 degrees LT                            PT Education - 10/15/15 0959    Education provided Yes   Education Details POC, HEP   Person(s) Educated Patient   Methods Explanation;Verbal cues;Handout   Comprehension Returned demonstration          PT Short Term Goals - 10/15/15 1017    PT SHORT TERM GOAL #1   Title She will be independent with inital HEP   Time 2   Period Weeks   Status New   PT SHORT TERM GOAL #2   Title She will report pain resolved LT knee   Time 2   Period Weeks   Status New           PT Long Term Goals - 10/15/15 1017    PT LONG TERM GOAL #1   Title She will be independent with all HEP issued as of last visit   Time 5   Period Weeks   Status New   PT LONG TERM GOAL #2   Title No painnLT knee , RT knee pain decr 30% or mroe with walking and standing   Time 5   Period Weeks   Status New   PT LONG TERM GOAL #3   Title She will report walking in store improved with less need to lean on cart   Time 5   Period Weeks   Status New   PT LONG TERM GOAL #4   Time --   Period --   Status --               Plan - 10/15/15 1013    Clinical Impression Statement [name] presents today withRT  knee pain and LT knee improved over past 2 days. She has flexion stiffness and good strength with activity on feet limited by pain. She reports RT knee bone on bone. She should do well with PT and lets hope LT knee pain resolved   Pt will benefit from skilled therapeutic intervention in order to improve on the following deficits  Decreased activity tolerance;Pain;Difficulty walking;Decreased range of motion   Rehab Potential Good   PT Frequency 2x / week   PT Duration --  5 weeks   PT Treatment/Interventions Cryotherapy;Iontophoresis 4mg /ml Dexamethasone;Therapeutic exercise;Manual techniques;Taping;Passive range of motion;Patient/family education   PT Next Visit Plan Review HEP and add weight as able , modalities as needed , possible tape/manual   PT Home Exercise Plan issued today for hip strength, quad strength   Consulted and Agree with Plan of Care Patient          G-Codes - 28-Oct-2015 1000    Functional Assessment Tool Used FOTO   Functional Limitation Mobility: Walking and moving around   Mobility: Walking and Moving Around Current Status ([id number]) At least 60 percent but less than 80 percent impaired, limited or restricted   Mobility: Walking and Moving Around Goal Status [phone]) At least 40 percent but less than 60 percent impaired, limited or restricted       Problem List Patient Active Problem List   Diagnosis Date Noted  . Acute sinusitis 05/31/2015  . Viral URI with cough 05/10/2015  . Primary osteoarthritis of right knee 02/19/2015  . Essential hypertension 02/19/2015  . Left-sided low back pain without sciatica 01/04/2015  . Polyuria 01/03/2015  . Left knee pain 10/31/2014  . Back pain 09/20/2014  . Chest pain 06/26/2014  . Abnormal EKG 06/26/2014  . Melasma 05/24/2014  . Insomnia 01/19/2014  . Hx Herpes simplex virus type 1 (HSV-1) dermatitis 07/22/2013  . Trigeminal neuralgia 01/18/2013  . Skin hypopigmentation 09/16/2012  . Possible exposure to STD 09/08/2012  . Blurry vision, bilateral 07/31/2012  . Leg pain 03/10/2012  . Menorrhagia 09/16/2011  . Allergic rhinitis 04/10/2011  . Glucose intolerance (pre-diabetes) 04/10/2011  . 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 Derangement of medial meniscus of right knee 02/06/2009  . HTN, goal below 140/90 09/27/2008  . VENOUS INSUFFICIENCY,  LEGS 07/07/2008  . SLEEP APNEA, OBSTRUCTIVE 03/24/2008  . 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 ARTHRITIS, ACROMIOCLAVICULAR 11/24/2007  . ROTATOR CUFF SPRAIN AND STRAIN 11/24/2007  . MIGRAINE NOS W/O INTRACTABLE MIGRAINE 08/23/2007  . Morbid obesity ([place]) 06/04/2007  . DEPRESSIVE DISORDER, RCR, MODERATE 06/04/2007    Darrel Hoover, PT  2015/10/28, 10:24 AM  [facility] [address] [place], [place], [address] Phone: [phone]   Fax:  [phone]  Name: Jessica [name] MRN: [id number] Date of Birth: 1961-01-12

## 2015-10-15 NOTE — Patient Instructions (Signed)
Quad Sets    Slowly tighten thigh muscles of straight, left leg while counting out loud to _5-10___. Relax. Repeat _10-25___ times. Do __2__ sessions per day.BFlexion - Supine    Tighten buttocks and raise straight uninvolved leg 2 inches from floor _10-15__ times. Repeat with involved leg. Do _2__ times per day.  Copyright  VHI. All rights reserved.  BEND FOOT /TOES TOWARD KNEE  FOR EACH EXERCISEAbduction: Clam (Eccentric) - Side-Lying    Lie on side with knees bent. Lift top knee, keeping feet together. Keep trunk steady. Slowly lower for 3-5 seconds. 10-15___ reps per set, 2___ sets per day, _5-7__ days per week. Add ___ lbs when you achieve ___ repetitions.  DO RT AND LT LEG FOR EACH EXERCISEABDUCTION: Side-Lying (Active)    Lie on left side, top leg straight. Raise top leg as far as possible. Use ___ lbs. Complete _1-2__ sets of _10-15__ repetitions. Perform _2Bridge    Lie back, legs bent. Inhale, pressing hips up. Keeping ribs in, lengthen lower back. Exhale, rolling down along spine from top. Repeat 10-15____ times. Do ___2KNEE: Extension, Long Arc Quads - Sitting    Raise leg until knee is straight. 10-20___ reps per set, _2__ sets per day, _5-7__ days per week    HOLD 5-10 REPS  Copyright  VHI. All rights reserved.  _ sessions per day.  http://pm.exer.us/55   Copyright  VHI. All rights reserved.  __ sessions per day.  http://gtsc.exer.us/95   Copyright  VHI. All rights reserved.    http://ecce.exer.us/65   Copyright  VHI. All rights reserved.    http://gt2.exer.us/293   Copyright  VHI. All rights reserved.

## 2015-10-15 NOTE — Telephone Encounter (Signed)
LM on VM for pt to call us.  Please inform pt of below message. Ottis Stain, CMA

## 2015-10-17 DIAGNOSIS — M170 Bilateral primary osteoarthritis of knee: Secondary | ICD-10-CM | POA: Diagnosis not present

## 2015-10-19 ENCOUNTER — Ambulatory Visit: Payer: Medicare Other | Admitting: Physical Therapy

## 2015-10-19 DIAGNOSIS — R6889 Other general symptoms and signs: Secondary | ICD-10-CM

## 2015-10-19 DIAGNOSIS — M25561 Pain in right knee: Secondary | ICD-10-CM | POA: Diagnosis not present

## 2015-10-19 DIAGNOSIS — R262 Difficulty in walking, not elsewhere classified: Secondary | ICD-10-CM

## 2015-10-19 DIAGNOSIS — M25669 Stiffness of unspecified knee, not elsewhere classified: Secondary | ICD-10-CM

## 2015-10-19 DIAGNOSIS — M25562 Pain in left knee: Principal | ICD-10-CM

## 2015-10-19 NOTE — Patient Instructions (Addendum)
External Rotation: Hip - Knees Apart (Hook-Lying)    Lie with hips and knees bent, band tied just above knees. Pull knees apart. Hold for _5__ seconds.  Repeat __10_ times. Do 2 sets.  Do 2___ times a day.  Copyright  VHI. All rights reserved.     ONTOPHORESIS PATIENT PRECAUTIONS & CONTRAINDICATIONS:  . Redness under one or both electrodes can occur.  This characterized by a uniform redness that usually disappears within 12 hours of treatment. . Small pinhead size blisters may result in response to the drug.  Contact your physician if the problem persists more than 24 hours. . On rare occasions, iontophoresis therapy can result in temporary skin reactions such as rash, inflammation, irritation or burns.  The skin reactions may be the result of individual sensitivity to the ionic solution used, the condition of the skin at the start of treatment, reaction to the materials in the electrodes, allergies or sensitivity to dexamethasone, or a poor connection between the patch and your skin.  Discontinue using iontophoresis if you have any of these reactions and report to your therapist. . Remove the Patch or electrodes if you have any undue sensation of pain or burning during the treatment and report discomfort to your therapist. . Tell your Therapist if you have had known adverse reactions to the application of electrical current. . If using the Patch, the LED light will turn off when treatment is complete and the patch can be removed.  Approximate treatment time is 1-3 hours.  Remove the patch when light goes off or after 6 hours. . The Patch can be worn during normal activity, however excessive motion where the electrodes have been placed can cause poor contact between the skin and the electrode or uneven electrical current resulting in greater risk of skin irritation. Marland Kitchen Keep out of the reach of children.   . DO NOT use if you have a cardiac pacemaker or any other electrically sensitive  implanted device. . DO NOT use if you have a known sensitivity to dexamethasone. . DO NOT use during Magnetic Resonance Imaging (MRI). . DO NOT use over broken or compromised skin (e.g. sunburn, cuts, or acne) due to the increased risk of skin reaction. . DO NOT SHAVE over the area to be treated:  To establish good contact between the Patch and the skin, excessive hair may be clipped. . DO NOT place the Patch or electrodes on or over your eyes, directly over your heart, or brain. . DO NOT reuse the Patch or electrodes as this may cause burns to occur.

## 2015-10-19 NOTE — Therapy (Addendum)
[place], [place], [address] Phone: [phone]   Fax:  [phone]  Physical Therapy Treatment  Patient Details  Name: Jessica [name] MRN: [id number] Date of Birth: 09/10/1961 Referring Provider: Aviva Kluver  Encounter Date: 10/19/2015      PT End of Session - 10/19/15 1206    Visit Number 2   Number of Visits 10   Date for PT Re-Evaluation 11/26/15   Authorization Type Medicare   PT Start Time 1100   PT Stop Time 1200   PT Time Calculation (min) 60 min      Past Medical History  Diagnosis Date  . Hypertension   . Obesity   . HSV (herpes simplex virus) infection   . Headache(784.0)   . Depression      on meds  . BV (bacterial vaginosis)     recent dx - finish med 12/22/11  . Insomnia     on meds  . Sleep apnea     occas. uses CPAP  . Anal fissure     history  . Arthritis     feet  . Migraines   . Angioedema of lips 03/21/2011    Presumed 2nd to ACE     Past Surgical History  Procedure Laterality Date  . Neck surgery  2001    cervical fusion C4/5  . Cesarean section      x 3  . Tubal ligation  1992  . Cholecystectomy    . Endometrial ablation    . Knee surgery  oct 2013    minesctomy and debridement    There were no vitals filed for this visit.  Visit Diagnosis:  Arthralgia of both knees  Stiffness of knee joint, unspecified laterality  Difficulty walking  Activity intolerance      Subjective Assessment - 10/19/15 1203    Subjective Sidelying exercise hurt my hip   Currently in Pain? Yes   Pain Score 6    Pain Location Knee  and hip    Pain Orientation Right   Pain Descriptors / Indicators Aching                         [facility] Adult PT Treatment/Exercise - 10/19/15 1215    Knee/Hip Exercises: Aerobic   Nustep L5 x31min   Knee/Hip Exercises: Supine   Quad Sets 1 set;10 reps   Short Arc Quad Sets 3 sets;10 reps   Short Arc Quad Sets  Limitations 2#   Bridges 1 set;10 reps   Bridges with [employer] 3 sets;10 reps   Other Supine Knee/Hip Exercises glut sets with leg raise 2sets of 10 reps   Other Supine Knee/Hip Exercises supine clams with yellow band 3 x15      Iontophoresis stat patch to right medial knee. Dexamethasone 1cc Wear time: 6 hours           PT Education - 10/19/15 1205    Education provided Yes   Education Details iontophorsis precautions, supine clam with yellow band   Person(s) Educated Patient   Methods Explanation;Handout   Comprehension Verbalized understanding          PT Short Term Goals - 10/15/15 1017    PT SHORT TERM GOAL #1   Title She will be independent with inital HEP   Time 2   Period Weeks   Status New   PT SHORT TERM GOAL #2   Title She will report pain resolved  LT knee   Time 2   Period Weeks   Status New           PT Long Term Goals - 10/15/15 1017    PT LONG TERM GOAL #1   Title She will be independent with all HEP issued as of last visit   Time 5   Period Weeks   Status New   PT LONG TERM GOAL #2   Title No painnLT knee , RT knee pain decr 30% or mroe with walking and standing   Time 5   Period Weeks   Status New   PT LONG TERM GOAL #3   Title She will report walking in store improved with less need to lean on cart   Time 5   Period Weeks   Status New   PT LONG TERM GOAL #4   Time --   Period --   Status --               Plan - 10/19/15 1207    Clinical Impression Statement Ms. Dimaio presented today with Right Knee and hip pain, and localized medial left knee pain. HEP was reviewed with no VC for understanding. Sidelying clams were modified to supine and didn't increase pain for patient (per patient report) Pt. was motivated and tolerated treatment with no increase in pain except for with sidelying clam exercise and the L sidelying abduction exercise. Reviewed HEP with patient and progressed with added resistance on some (see flow  chart). Patient complained of pain in specific area of her left knee and was treated with iontophoresis to reduce pain.    PT Next Visit Plan Review HEP addition and inquire with patient about the effectiveness of the iontophoresis that was used on left knee; continue with PT according to plan of care to increase strength around the knee and reduce pain. Possible flexibility exercises to add to HEP        Problem List Patient Active Problem List   Diagnosis Date Noted  . Acute sinusitis 05/31/2015  . Viral URI with cough 05/10/2015  . Primary osteoarthritis of right knee 02/19/2015  . Essential hypertension 02/19/2015  . Left-sided low back pain without sciatica 01/04/2015  . Polyuria 01/03/2015  . Left knee pain 10/31/2014  . Back pain 09/20/2014  . Chest pain 06/26/2014  . Abnormal EKG 06/26/2014  . Melasma 05/24/2014  . Insomnia 01/19/2014  . Hx Herpes simplex virus type 1 (HSV-1) dermatitis 07/22/2013  . Trigeminal neuralgia 01/18/2013  . Skin hypopigmentation 09/16/2012  . Possible exposure to STD 09/08/2012  . Blurry vision, bilateral 07/31/2012  . Leg pain 03/10/2012  . Menorrhagia 09/16/2011  . Allergic rhinitis 04/10/2011  . Glucose intolerance (pre-diabetes) 04/10/2011  . 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 Derangement of medial meniscus of right knee 02/06/2009  . HTN, goal below 140/90 09/27/2008  . VENOUS INSUFFICIENCY, LEGS 07/07/2008  . SLEEP APNEA, OBSTRUCTIVE 03/24/2008  . 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 ARTHRITIS, ACROMIOCLAVICULAR 11/24/2007  . ROTATOR CUFF SPRAIN AND STRAIN 11/24/2007  . MIGRAINE NOS W/O INTRACTABLE MIGRAINE 08/23/2007  . Morbid obesity ([place]) 06/04/2007  . DEPRESSIVE DISORDER, RCR, MODERATE 06/04/2007   Hessie Diener, PTA 10/24/2015 1:33 PM Phone: [phone] Fax: [phone]  Laury Axon , [place]  10/19/2015, 12:25 PM  [facility] [address] [place], [place], [address] Phone: [phone]   Fax:  [phone]  Name: RAYMONDA PELL MRN: [id number] Date of Birth: 31-Dec-1960

## 2015-10-24 ENCOUNTER — Ambulatory Visit: Payer: Medicare Other

## 2015-10-24 DIAGNOSIS — M25561 Pain in right knee: Secondary | ICD-10-CM

## 2015-10-24 DIAGNOSIS — R262 Difficulty in walking, not elsewhere classified: Secondary | ICD-10-CM

## 2015-10-24 DIAGNOSIS — M25562 Pain in left knee: Principal | ICD-10-CM

## 2015-10-24 DIAGNOSIS — M25669 Stiffness of unspecified knee, not elsewhere classified: Secondary | ICD-10-CM

## 2015-10-24 NOTE — Therapy (Signed)
[place], [place], [address] Phone: [phone]   Fax:  [phone]  Physical Therapy Treatment  Patient Details  Name: Jessica [name] MRN: [id number] Date of Birth: [birth year] Referring Provider: Aviva [name]  Encounter Date: 10/24/2015      PT End of Session - 10/24/15 1318    Visit Number 3   Number of Visits 10   Date for PT Re-Evaluation 11/26/15   PT Start Time 9892   PT Stop Time 1313   PT Time Calculation (min) 43 min   Activity Tolerance Patient tolerated treatment well   Behavior During Therapy [facility] for tasks assessed/performed      Past Medical History  Diagnosis Date  . Hypertension   . Obesity   . HSV (herpes simplex virus) infection   . Headache(784.0)   . Depression      on meds  . BV (bacterial vaginosis)     recent dx - finish med 12/22/11  . Insomnia     on meds  . Sleep apnea     occas. uses CPAP  . Anal fissure     history  . Arthritis     feet  . Migraines   . Angioedema of lips 03/21/2011    Presumed 2nd to ACE     Past Surgical History  Procedure Laterality Date  . Neck surgery  2001    cervical fusion C4/5  . Cesarean section      x 3  . Tubal ligation  1992  . Cholecystectomy    . Endometrial ablation    . Knee surgery  oct 2013    minesctomy and debridement    There were no vitals filed for this visit.  Visit Diagnosis:  Arthralgia of both knees  Stiffness of knee joint, unspecified laterality  Difficulty walking      Subjective Assessment - 10/24/15 1232    Currently in Pain? Yes   Pain Score 4    Pain Location --  Lt leg   Pain Descriptors / Indicators Aching   Pain Type Acute pain   Pain Onset More than a month ago   Pain Frequency Constant   Pain Relieving Factors cold , heat, meds , cream   Multiple Pain Sites --  RT and LT knee                         [facility] Adult PT Treatment/Exercise - 10/24/15 0001    Knee/Hip Exercises: Aerobic   Nustep L5 x 6 min   Knee/Hip Exercises: Supine   Short Arc [employer] 15 reps   Short Arc Quad Sets Limitations 2 pounds RT and LT   Bridges with [employer] 3 sets;10 reps   Other Supine Knee/Hip Exercises supine clams with red  band 2x15   Modalities   Modalities Ultrasound;Iontophoresis   Ultrasound   Ultrasound Location medial LT knee   Ultrasound Parameters 100% 1 Mhz 1.2 Wcm2   Ultrasound Goals Pain   Iontophoresis   Type of Iontophoresis Dexamethasone   Location medial LT knee   Dose 1cc   Time 4 hours    Manual Therapy   Manual Therapy Taping                PT Education - 10/24/15 1318    Education provided Yes   Education Details ionto reoval/management   Person(s) Educated Patient   Methods Explanation   Comprehension Verbalized understanding  PT Short Term Goals - 10/24/15 1321    PT SHORT TERM GOAL #1   Title She will be independent with inital HEP   Status Achieved   PT SHORT TERM GOAL #2   Title She will report pain resolved LT knee   Status On-going           PT Long Term Goals - 10/24/15 1321    PT LONG TERM GOAL #1   Title She will be independent with all HEP issued as of last visit   Status On-going   PT LONG TERM GOAL #2   Title No pain LT knee , RT knee pain decr 30% or mroe with walking and standing   Status On-going   PT LONG TERM GOAL #3   Title She will report walking in store improved with less need to lean on cart   Status On-going               Plan - 10/24/15 1319    Clinical Impression Statement She did exercise without complaint andfelt better after [place].  she was able to do exercise without cues   PT Next Visit Plan Continue strength and cont [place] and ionto if helpful.    Consulted and Agree with Plan of Care Patient        Problem List Patient Active Problem List   Diagnosis Date Noted  . Acute sinusitis 05/31/2015  . Viral URI with cough 05/10/2015  . Primary  osteoarthritis of right knee 02/19/2015  . Essential hypertension 02/19/2015  . Left-sided low back pain without sciatica 01/04/2015  . Polyuria 01/03/2015  . Left knee pain 10/31/2014  . Back pain 09/20/2014  . Chest pain 06/26/2014  . Abnormal EKG 06/26/2014  . Melasma 05/24/2014  . Insomnia 01/19/2014  . Hx Herpes simplex virus type 1 (HSV-1) dermatitis 07/22/2013  . Trigeminal neuralgia 01/18/2013  . Skin hypopigmentation 09/16/2012  . Possible exposure to STD 09/08/2012  . Blurry vision, bilateral 07/31/2012  . Leg pain 03/10/2012  . Menorrhagia 09/16/2011  . Allergic rhinitis 04/10/2011  . Glucose intolerance (pre-diabetes) 04/10/2011  . 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 Derangement of medial meniscus of right knee 02/06/2009  . HTN, goal below 140/90 09/27/2008  . VENOUS INSUFFICIENCY, LEGS 07/07/2008  . SLEEP APNEA, OBSTRUCTIVE 03/24/2008  . 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 ARTHRITIS, ACROMIOCLAVICULAR 11/24/2007  . ROTATOR CUFF SPRAIN AND STRAIN 11/24/2007  . MIGRAINE NOS W/O INTRACTABLE MIGRAINE 08/23/2007  . Morbid obesity ([place]) 06/04/2007  . DEPRESSIVE DISORDER, RCR, MODERATE 06/04/2007    Darrel Hoover PT 10/24/2015, 1:24 PM  [facility] [address] [place], [place], [address] Phone: [phone]   Fax:  [phone]  Name: Jessica [name] MRN: [id number] Date of Birth: 04/15/1961

## 2015-10-24 NOTE — Patient Instructions (Signed)
She was cautioned to remove patch if irritating skin  Or to remove at 4 hours if no irrtiation

## 2015-10-29 ENCOUNTER — Ambulatory Visit: Payer: Medicare Other | Admitting: Physical Therapy

## 2015-10-29 ENCOUNTER — Encounter: Payer: Self-pay | Admitting: *Deleted

## 2015-10-29 ENCOUNTER — Other Ambulatory Visit: Payer: Self-pay | Admitting: *Deleted

## 2015-10-29 DIAGNOSIS — R6889 Other general symptoms and signs: Secondary | ICD-10-CM

## 2015-10-29 DIAGNOSIS — M25561 Pain in right knee: Secondary | ICD-10-CM

## 2015-10-29 DIAGNOSIS — R262 Difficulty in walking, not elsewhere classified: Secondary | ICD-10-CM

## 2015-10-29 DIAGNOSIS — M25669 Stiffness of unspecified knee, not elsewhere classified: Secondary | ICD-10-CM

## 2015-10-29 DIAGNOSIS — M25562 Pain in left knee: Principal | ICD-10-CM

## 2015-10-29 MED ORDER — BUPROPION HCL ER (XL) 150 MG PO TB24: 150.0000 mg | ORAL_TABLET | Freq: Every day | ORAL | Status: DC

## 2015-10-29 NOTE — Therapy (Addendum)
[place], [place], [address] Phone: [phone]   Fax:  [phone]  Physical Therapy Treatment  Patient Details  Name: Jessica [name] MRN: [id number] Date of Birth: 08-20-1961 Referring Provider: Aviva Kluver  Encounter Date: 10/29/2015      PT End of Session - 10/29/15 1333    Visit Number 4   Number of Visits 10   Date for PT Re-Evaluation 11/26/15   Authorization Type Medicare   PT Start Time 1146   PT Stop Time 6468   PT Time Calculation (min) 49 min      Past Medical History  Diagnosis Date  . Hypertension   . Obesity   . HSV (herpes simplex virus) infection   . Headache(784.0)   . Depression      on meds  . BV (bacterial vaginosis)     recent dx - finish med 12/22/11  . Insomnia     on meds  . Sleep apnea     occas. uses CPAP  . Anal fissure     history  . Arthritis     feet  . Migraines   . Angioedema of lips 03/21/2011    Presumed 2nd to ACE     Past Surgical History  Procedure Laterality Date  . Neck surgery  2001    cervical fusion C4/5  . Cesarean section      x 3  . Tubal ligation  1992  . Cholecystectomy    . Endometrial ablation    . Knee surgery  oct 2013    minesctomy and debridement    There were no vitals filed for this visit.  Visit Diagnosis:  No diagnosis found.      Subjective Assessment - 10/29/15 1327    Subjective the patch reduced her pain from Wednesday to Saturday; "I liked the ultrasound last time"   Currently in Pain? Yes   Pain Score 3    Pain Location Knee   Pain Orientation Right;Left                         [facility] Adult PT Treatment/Exercise - 10/29/15 1329    Knee/Hip Exercises: Aerobic   Nustep L5 x 6 min   Knee/Hip Exercises: Supine   Short Arc Quad Sets 15 reps   Bridges with [employer] 3 sets;10 reps   Other Supine Knee/Hip Exercises ball squeeze 10 second holds 10 reps   Other Supine Knee/Hip  Exercises supine clams with red  band 2x15   Modalities   Modalities Ultrasound;Iontophoresis   Ultrasound   Ultrasound Location medial L knee   Ultrasound Parameters pulsed 1Wcm2   Ultrasound Goals Pain   Iontophoresis   Type of Iontophoresis Dexamethasone   Location medial LT knee and medial RT knee   Dose 1cc per side   Time 6 hours on each side                  PT Short Term Goals - 10/24/15 1321    PT SHORT TERM GOAL #1   Title She will be independent with inital HEP   Status Achieved   PT SHORT TERM GOAL #2   Title She will report pain resolved LT knee   Status On-going           PT Long Term Goals - 10/24/15 1321    PT LONG TERM GOAL #1   Title She will be independent with all  HEP issued as of last visit   Status On-going   PT LONG TERM GOAL #2   Title No pain LT knee , RT knee pain decr 30% or mroe with walking and standing   Status On-going   PT LONG TERM GOAL #3   Title She will report walking in store improved with less need to lean on cart   Status On-going               Plan - 10/29/15 1334    Clinical Impression Statement She reported reduced pain after the [place], but complained of pain in both knees during ther ex. As a result I put iontophoresis patches on both knees in an attempt to reduce the pain. Patient still reported need to lean on something when she was out in public and walking for longer than 10 minutes as her knee would just "give out"   PT Next Visit Plan Continue strength and cont [place] and ionto if helpful.        During this treatment session, the therapist was present, participating in and directing the treatment.  Problem List Patient Active Problem List   Diagnosis Date Noted  . Acute sinusitis 05/31/2015  . Viral URI with cough 05/10/2015  . Primary osteoarthritis of right knee 02/19/2015  . Essential hypertension 02/19/2015  . Left-sided low back pain without sciatica 01/04/2015  . Polyuria 01/03/2015  . Left  knee pain 10/31/2014  . Back pain 09/20/2014  . Chest pain 06/26/2014  . Abnormal EKG 06/26/2014  . Melasma 05/24/2014  . Insomnia 01/19/2014  . Hx Herpes simplex virus type 1 (HSV-1) dermatitis 07/22/2013  . Trigeminal neuralgia 01/18/2013  . Skin hypopigmentation 09/16/2012  . Possible exposure to STD 09/08/2012  . Blurry vision, bilateral 07/31/2012  . Leg pain 03/10/2012  . Menorrhagia 09/16/2011  . Allergic rhinitis 04/10/2011  . Glucose intolerance (pre-diabetes) 04/10/2011  . 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 Derangement of medial meniscus of right knee 02/06/2009  . HTN, goal below 140/90 09/27/2008  . VENOUS INSUFFICIENCY, LEGS 07/07/2008  . SLEEP APNEA, OBSTRUCTIVE 03/24/2008  . 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 ARTHRITIS, ACROMIOCLAVICULAR 11/24/2007  . ROTATOR CUFF SPRAIN AND STRAIN 11/24/2007  . MIGRAINE NOS W/O INTRACTABLE MIGRAINE 08/23/2007  . Morbid obesity ([place]) 06/04/2007  . DEPRESSIVE DISORDER, RCR, MODERATE 06/04/2007   Laury Axon, SPTA 10/29/2015 1:38 PM PHONE:[phone] FAX:[phone]  Hessie Diener, PTA 10/29/2015 3:34 PM Phone: [phone] Fax: [place] [facility] [address] [place], [place], [address] Phone: [phone]   Fax:  [phone]  Name: ISMAHAN LIPPMAN MRN: [id number] Date of Birth: [birth year]

## 2015-10-29 NOTE — Telephone Encounter (Signed)
Error   This encounter was created in error - please disregard. 

## 2015-10-30 ENCOUNTER — Other Ambulatory Visit: Payer: Self-pay | Admitting: *Deleted

## 2015-10-30 DIAGNOSIS — B001 Herpesviral vesicular dermatitis: Secondary | ICD-10-CM

## 2015-10-30 MED ORDER — VALACYCLOVIR HCL 500 MG PO TABS: 500.0000 mg | ORAL_TABLET | Freq: Every day | ORAL | Status: DC

## 2015-10-31 ENCOUNTER — Ambulatory Visit: Payer: Medicare Other | Attending: Family Medicine | Admitting: Physical Therapy

## 2015-10-31 DIAGNOSIS — R262 Difficulty in walking, not elsewhere classified: Secondary | ICD-10-CM | POA: Diagnosis not present

## 2015-10-31 DIAGNOSIS — M25561 Pain in right knee: Secondary | ICD-10-CM

## 2015-10-31 DIAGNOSIS — M25669 Stiffness of unspecified knee, not elsewhere classified: Secondary | ICD-10-CM

## 2015-10-31 DIAGNOSIS — M25562 Pain in left knee: Secondary | ICD-10-CM | POA: Diagnosis not present

## 2015-10-31 DIAGNOSIS — R6889 Other general symptoms and signs: Secondary | ICD-10-CM

## 2015-10-31 NOTE — Therapy (Addendum)
[place], [place], [address] Phone: [phone]   Fax:  [phone]  Physical Therapy Treatment  Patient Details  Name: Jessica [name] MRN: [id number] Date of Birth: 21-Jun-1961 Referring Provider: Aviva Kluver  Encounter Date: 10/31/2015      PT End of Session - 10/31/15 1154    Visit Number 5   Number of Visits 10   Date for PT Re-Evaluation 11/26/15   Authorization Type Medicare   PT Start Time 1100   PT Stop Time 1200   PT Time Calculation (min) 60 min   Activity Tolerance Patient tolerated treatment well   Behavior During Therapy [facility] for tasks assessed/performed      Past Medical History  Diagnosis Date  . Hypertension   . Obesity   . HSV (herpes simplex virus) infection   . Headache(784.0)   . Depression      on meds  . BV (bacterial vaginosis)     recent dx - finish med 12/22/11  . Insomnia     on meds  . Sleep apnea     occas. uses CPAP  . Anal fissure     history  . Arthritis     feet  . Migraines   . Angioedema of lips 03/21/2011    Presumed 2nd to ACE     Past Surgical History  Procedure Laterality Date  . Neck surgery  2001    cervical fusion C4/5  . Cesarean section      x 3  . Tubal ligation  1992  . Cholecystectomy    . Endometrial ablation    . Knee surgery  oct 2013    minesctomy and debridement    There were no vitals filed for this visit.  Visit Diagnosis:  Arthralgia of both knees  Stiffness of knee joint, unspecified laterality  Difficulty walking  Activity intolerance      Subjective Assessment - 10/31/15 1151    Subjective "Enjoyed the patch and ultrasound"  "I took a walk since last week and then had a bad day" She expressed concern specifically about her R knee pain and reported no pain in her left knee   Currently in Pain? No/denies   Pain Location Knee   Pain Orientation Left   Multiple Pain Sites Yes   Pain Score 6   Pain Location  Knee   Pain Orientation Right   Pain Descriptors / Indicators Aching                         [facility] Adult PT Treatment/Exercise - 10/31/15 1220    Knee/Hip Exercises: Aerobic   Nustep L5 x 6 min   Knee/Hip Exercises: Supine   Short Arc Quad Sets 2 sets;15 reps   Short Arc Quad Sets Limitations 3#   Other Supine Knee/Hip Exercises ball squeeze 10 second holds 15 reps 2 sets   Other Supine Knee/Hip Exercises supine clams with red  band 2x15; Marches with abdominal bracing; Manual adductor stretch 2 reps with 30 second holds   Cryotherapy   Number Minutes Cryotherapy 15 Minutes   Cryotherapy Location Knee   Type of Cryotherapy Ice pack   Electrical Stimulation   Electrical Stimulation Location Rt medial Knee   Electrical Stimulation Action IFC   Electrical Stimulation Parameters to tol; 15 minutes   Electrical Stimulation Goals Pain   Ultrasound   Ultrasound Location Medial Rt Knee   Ultrasound Parameters 100% 1Mhz 1.2 Wcm2  Ultrasound Goals Pain   Iontophoresis   Type of Iontophoresis Dexamethasone   Location medial RT knee and medial RT knee   Dose 1cc per side   Time 6 hours on each side                  PT Short Term Goals - 10/24/15 1321    PT SHORT TERM GOAL #1   Title She will be independent with inital HEP   Status Achieved   PT SHORT TERM GOAL #2   Title She will report pain resolved LT knee   Status On-going           PT Long Term Goals - 10/24/15 1321    PT LONG TERM GOAL #1   Title She will be independent with all HEP issued as of last visit   Status On-going   PT LONG TERM GOAL #2   Title No pain LT knee , RT knee pain decr 30% or mroe with walking and standing   Status On-going   PT LONG TERM GOAL #3   Title She will report walking in store improved with less need to lean on cart   Status On-going               Plan - 10/31/15 1154    Clinical Impression Statement She complained of 6/10 pain in her right knee  with pain only exasterbated on bridges and with standing. Exercises that didn't put any weight through the knee were not a problem. Pain relief was focused on the right knee with cryoptherapy and IFC estim in combination, ultrasound, and iontophoresis used on the Right knee as  the knee pain was causing a negative emotional reaction and thus was prioritized at the end of treatment after strengthening. Aquatic therapy was discussed to reduce pain with exercise outside of the clinic. Patient reported no pain in the Rt knee when leaving.   PT Next Visit Plan Continue strength and cont [place] and ionto if helpful. Check effectiveness of Estim in reducing the pain. try massage to reduce tightness in adductors and reduce pain across the medial RT knee     During this treatment session, the therapist was present, participating in and directing the treatment.   Problem List Patient Active Problem List   Diagnosis Date Noted  . Acute sinusitis 05/31/2015  . Viral URI with cough 05/10/2015  . Primary osteoarthritis of right knee 02/19/2015  . Essential hypertension 02/19/2015  . Left-sided low back pain without sciatica 01/04/2015  . Polyuria 01/03/2015  . Left knee pain 10/31/2014  . Back pain 09/20/2014  . Chest pain 06/26/2014  . Abnormal EKG 06/26/2014  . Melasma 05/24/2014  . Insomnia 01/19/2014  . Hx Herpes simplex virus type 1 (HSV-1) dermatitis 07/22/2013  . Trigeminal neuralgia 01/18/2013  . Skin hypopigmentation 09/16/2012  . Possible exposure to STD 09/08/2012  . Blurry vision, bilateral 07/31/2012  . Leg pain 03/10/2012  . Menorrhagia 09/16/2011  . Allergic rhinitis 04/10/2011  . Glucose intolerance (pre-diabetes) 04/10/2011  . 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 Derangement of medial meniscus of right knee 02/06/2009  . HTN, goal below 140/90 09/27/2008  . VENOUS INSUFFICIENCY, LEGS 07/07/2008  . SLEEP APNEA, OBSTRUCTIVE 03/24/2008  . Arthritis of great toe at metatarsophalangeal  joint 03/03/2008  . ARTHRITIS, ACROMIOCLAVICULAR 11/24/2007  . ROTATOR CUFF SPRAIN AND STRAIN 11/24/2007  . MIGRAINE NOS W/O INTRACTABLE MIGRAINE 08/23/2007  . Morbid obesity ([place]) 06/04/2007  . DEPRESSIVE DISORDER, RCR, MODERATE 06/04/2007  Laury Axon, [place] 10/31/2015 12:32 PM PHONE:[phone] FAX:[phone]  Hessie Diener, PTA 10/31/2015 2:27 PM Phone: [phone] Fax: [facility] [address] [place], [place], [address] Phone: [phone]   Fax:  [phone]  Name: Jessica [name] MRN: [id number] Date of Birth: [birth year]

## 2015-11-01 ENCOUNTER — Other Ambulatory Visit: Payer: Self-pay | Admitting: *Deleted

## 2015-11-01 NOTE — Telephone Encounter (Signed)
Received fax from [facility] requesting a refill on cyclobenzaprine 10mg  take one tablet by mouth TID PRN muscle spasms.  Not on medication list. Will forward to MD. Clinton Sawyer, Vianny Spotted

## 2015-11-01 NOTE — Telephone Encounter (Signed)
Will not refill at this time. Last prescribed 12/2014. Not meant for long term use.

## 2015-11-02 ENCOUNTER — Other Ambulatory Visit: Payer: Self-pay | Admitting: Family Medicine

## 2015-11-02 DIAGNOSIS — B001 Herpesviral vesicular dermatitis: Secondary | ICD-10-CM

## 2015-11-02 NOTE — Telephone Encounter (Signed)
Informed pt about flexeril, appt was made for 11/05/15. [name], Jessica Spotted

## 2015-11-02 NOTE — Telephone Encounter (Signed)
Explained about the flexeril (see previous phone note) She is requesting refill on valtrex and zovirax 5% cream. [name], [name]

## 2015-11-02 NOTE — Telephone Encounter (Signed)
Pt called and was wanting 90 day refills on her Flexeril and cold sore cream. jw

## 2015-11-04 MED ORDER — VALACYCLOVIR HCL 500 MG PO TABS: 500.0000 mg | ORAL_TABLET | Freq: Every day | ORAL | Status: DC

## 2015-11-04 MED ORDER — ACYCLOVIR 5 % EX OINT: 1.0000 "application " | TOPICAL_OINTMENT | CUTANEOUS | Status: DC

## 2015-11-05 ENCOUNTER — Ambulatory Visit: Payer: Medicare Other

## 2015-11-05 ENCOUNTER — Telehealth: Payer: Self-pay | Admitting: *Deleted

## 2015-11-05 ENCOUNTER — Other Ambulatory Visit: Payer: Self-pay | Admitting: *Deleted

## 2015-11-05 DIAGNOSIS — M25561 Pain in right knee: Secondary | ICD-10-CM | POA: Diagnosis not present

## 2015-11-05 DIAGNOSIS — M25669 Stiffness of unspecified knee, not elsewhere classified: Secondary | ICD-10-CM

## 2015-11-05 DIAGNOSIS — M25562 Pain in left knee: Principal | ICD-10-CM

## 2015-11-05 DIAGNOSIS — R6889 Other general symptoms and signs: Secondary | ICD-10-CM | POA: Diagnosis not present

## 2015-11-05 DIAGNOSIS — R262 Difficulty in walking, not elsewhere classified: Secondary | ICD-10-CM

## 2015-11-05 DIAGNOSIS — B001 Herpesviral vesicular dermatitis: Secondary | ICD-10-CM

## 2015-11-05 NOTE — Patient Instructions (Signed)
Discussed feeling of tightness and possible causes. Suggested to ice and elevate to minimize any swelling. Suggested to continued using bike at home for exercise and stop if painful.

## 2015-11-05 NOTE — Therapy (Signed)
[place], [place], [address] Phone: [phone]   Fax:  [phone]  Physical Therapy Treatment  Patient Details  Name: Jessica [name] MRN: [id number] Date of Birth: June 11, 1961 Referring Provider: Aviva Kluver  Encounter Date: 11/05/2015      PT End of Session - 11/05/15 1224    Visit Number 6   Number of Visits 10   Date for PT Re-Evaluation 11/26/15   PT Start Time 1275   PT Stop Time 1225   PT Time Calculation (min) 40 min   Activity Tolerance Patient tolerated treatment well   Behavior During Therapy [facility] for tasks assessed/performed      Past Medical History  Diagnosis Date  . Hypertension   . Obesity   . HSV (herpes simplex virus) infection   . Headache(784.0)   . Depression      on meds  . BV (bacterial vaginosis)     recent dx - finish med 12/22/11  . Insomnia     on meds  . Sleep apnea     occas. uses CPAP  . Anal fissure     history  . Arthritis     feet  . Migraines   . Angioedema of lips 03/21/2011    Presumed 2nd to ACE     Past Surgical History  Procedure Laterality Date  . Neck surgery  2001    cervical fusion C4/5  . Cesarean section      x 3  . Tubal ligation  1992  . Cholecystectomy    . Endometrial ablation    . Knee surgery  oct 2013    minesctomy and debridement    There were no vitals filed for this visit.  Visit Diagnosis:  Arthralgia of both knees  Difficulty walking  Stiffness of knee joint, unspecified laterality      Subjective Assessment - 11/05/15 1146    Subjective Stiff this weekend . No pain today. min pain this week end.    Currently in Pain? No/denies                         [facility] Adult PT Treatment/Exercise - 11/05/15 1155    Knee/Hip Exercises: Aerobic   Nustep L5 x 6 min   Knee/Hip Exercises: Supine   Short Arc Quad Sets 2 sets;15 reps   Short Arc Quad Sets Limitations 4   Straight Leg Raises  Right;Left;15 reps   Other Supine Knee/Hip Exercises ball squeeze 5  second holds 15 reps 1 sets,    Other Supine Knee/Hip Exercises supine clams with red  band 2x15; Marches with abdominal bracing; Manual adductor stretch 2 reps with 30 second holds   Knee/Hip Exercises: Prone   Hamstring Curl 20 reps;2 seconds   Hamstring Curl Limitations this calf stretch RT with strap x 30 sec x 3 secondary to calf pain.      Pain resolved after calf stretch, Also performed in standing after instruction. She did not need handout             PT Short Term Goals - 11/05/15 1228    PT SHORT TERM GOAL #1   Title She will be independent with inital HEP   Status Achieved   PT SHORT TERM GOAL #2   Title She will report pain resolved LT knee   Status Achieved           PT Long Term Goals - 11/05/15 1228  PT LONG TERM GOAL #1   Title She will be independent with all HEP issued as of last visit   PT LONG TERM GOAL #2   Title No pain LT knee , RT knee pain decr 30% or more with walking and standing   Status On-going   PT LONG TERM GOAL #3   Title She will report walking in store improved with less need to lean on cart   Status On-going               Plan - 11/05/15 1224    Clinical Impression Statement It appears LT knee pain essentially resolved . RT knee pain chronic has been giving her most problem but has been good except with tight feeling. We will treat as needed for pain and strength   PT Next Visit Plan Continue strength and cont [place] and ionto if  needed.  Maual or tape . Continue strengthening   Consulted and Agree with Plan of Care Patient        Problem List Patient Active Problem List   Diagnosis Date Noted  . Acute sinusitis 05/31/2015  . Viral URI with cough 05/10/2015  . Primary osteoarthritis of right knee 02/19/2015  . Essential hypertension 02/19/2015  . Left-sided low back pain without sciatica 01/04/2015  . Polyuria 01/03/2015  . Left knee pain  10/31/2014  . Back pain 09/20/2014  . Chest pain 06/26/2014  . Abnormal EKG 06/26/2014  . Melasma 05/24/2014  . Insomnia 01/19/2014  . Hx Herpes simplex virus type 1 (HSV-1) dermatitis 07/22/2013  . Trigeminal neuralgia 01/18/2013  . Skin hypopigmentation 09/16/2012  . Possible exposure to STD 09/08/2012  . Blurry vision, bilateral 07/31/2012  . Leg pain 03/10/2012  . Menorrhagia 09/16/2011  . Allergic rhinitis 04/10/2011  . Glucose intolerance (pre-diabetes) 04/10/2011  . 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 Derangement of medial meniscus of right knee 02/06/2009  . HTN, goal below 140/90 09/27/2008  . VENOUS INSUFFICIENCY, LEGS 07/07/2008  . SLEEP APNEA, OBSTRUCTIVE 03/24/2008  . 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 ARTHRITIS, ACROMIOCLAVICULAR 11/24/2007  . ROTATOR CUFF SPRAIN AND STRAIN 11/24/2007  . MIGRAINE NOS W/O INTRACTABLE MIGRAINE 08/23/2007  . Morbid obesity ([place]) 06/04/2007  . DEPRESSIVE DISORDER, RCR, MODERATE 06/04/2007    Darrel Hoover PT 11/05/2015, 12:29 PM  [facility] [address] [place], [place], [address] Phone: [phone]   Fax:  [phone]  Name: Jessica [name] MRN: [id number] Date of Birth: 1961-03-18

## 2015-11-05 NOTE — Telephone Encounter (Signed)
This was already sent to the pharmacy

## 2015-11-05 NOTE — Telephone Encounter (Signed)
Prior Authorization received from [facility] pharmacy for acyclovir oint. Formulary and PA form placed in provider box for completion. Derl Barrow, RN

## 2015-11-06 ENCOUNTER — Other Ambulatory Visit: Payer: Self-pay | Admitting: *Deleted

## 2015-11-06 NOTE — Telephone Encounter (Signed)
Already sent to the pharmacy. Handling PA

## 2015-11-07 ENCOUNTER — Ambulatory Visit: Payer: Medicare Other | Admitting: Physical Therapy

## 2015-11-07 ENCOUNTER — Ambulatory Visit (INDEPENDENT_AMBULATORY_CARE_PROVIDER_SITE_OTHER): Payer: Medicare Other | Admitting: Family Medicine

## 2015-11-07 ENCOUNTER — Encounter: Payer: Self-pay | Admitting: Family Medicine

## 2015-11-07 VITALS — BP 140/82 | HR 72 | Temp 98.4°F | Ht 61.0 in | Wt 272.0 lb

## 2015-11-07 DIAGNOSIS — M546 Pain in thoracic spine: Secondary | ICD-10-CM | POA: Diagnosis not present

## 2015-11-07 DIAGNOSIS — B0089 Other herpesviral infection: Secondary | ICD-10-CM | POA: Diagnosis not present

## 2015-11-07 DIAGNOSIS — I10 Essential (primary) hypertension: Secondary | ICD-10-CM

## 2015-11-07 MED ORDER — AMLODIPINE BESYLATE 10 MG PO TABS: 10.0000 mg | ORAL_TABLET | Freq: Every day | ORAL | Status: DC

## 2015-11-07 MED ORDER — TIZANIDINE HCL 2 MG PO TABS: 2.0000 mg | ORAL_TABLET | Freq: Every day | ORAL | Status: DC

## 2015-11-07 NOTE — Patient Instructions (Signed)
Thank you for coming to see me today. It was a pleasure. Today we talked about:   Hypertension: I am increasing your amlodipine to 10mg  daily  Back pain: I think you strained your back. I will prescribe a muscle relaxer for you to take at night  Cold sore: I will work on getting the cream covered by insurance  Please make an appointment to see me in one month for follow-up of your weight.  If you have any questions or concerns, please do not hesitate to call the office at [phone].  Sincerely,  Cordelia Poche, MD

## 2015-11-07 NOTE — Progress Notes (Signed)
    Subjective    Jessica [name] is a 54 y.o. female that presents for a follow-up visit for chronic issues.   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 She generally uses acyclovir cream which helps.  2. Hypertension: Patient is adherent with amlodipine and triamterene-hctz 37.5-25. She has no chest pain. Sometimes she has dyspnea when walking.   3. Back pain: Symptoms started about one month ago. 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 No urine/bowel incontinence, no retention. She is walking well. She reports falls, but related to her knee giving out.  Social History  Substance Use Topics  . Smoking status: Never Smoker   . Smokeless tobacco: Never Used  . Alcohol Use: No     Comment: socially    Allergies  Allergen Reactions  . Ace Inhibitors     Lip swelling with lisinopril  . Angiotensin Receptor Blockers     Lip swelling with ACE  . Latex Itching and Rash    Meds ordered this encounter  Medications  . acetaminophen (TYLENOL) 500 MG tablet    Sig: Take 500 mg by mouth every 6 (six) hours as needed for moderate pain (knee and back).  . naproxen sodium (ANAPROX) 220 MG tablet    Sig: Take 220 mg by mouth 2 (two) times daily with a meal.    ROS  Per HPI   Objective   BP 140/82 mmHg  Pulse 72  Temp(Src) 98.4 F (36.9 C) (Oral)  Ht 5\' 1"  (1.549 m)  Wt 272 lb (123.378 kg)  BMI 51.42 kg/m2  General: Well appearing, no distress HEENT: No noticeable lip lesions Musculoskeletal: Tenderness over right thoracic paraspinal muscle. Flexion,extension, rotation of back full.  Assessment and Plan    Hx Herpes simplex virus type 1 (HSV-1) dermatitis Recent outbreak. 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 Will refill topical acyclovir  Essential hypertension Patient right at cutoff for control. Will increase to amlodipine 10mg  daily.  Back pain Will trial Zanaflex for short term management.

## 2015-11-07 NOTE — Telephone Encounter (Signed)
Insurance will likely not cover topical acyclovir since first line is oral, which she has already been prescribed. Topical not recommended in treating her current disease.

## 2015-11-12 ENCOUNTER — Ambulatory Visit: Payer: Medicare Other | Admitting: Physical Therapy

## 2015-11-12 DIAGNOSIS — R6889 Other general symptoms and signs: Secondary | ICD-10-CM | POA: Diagnosis not present

## 2015-11-12 DIAGNOSIS — M25669 Stiffness of unspecified knee, not elsewhere classified: Secondary | ICD-10-CM

## 2015-11-12 DIAGNOSIS — M25562 Pain in left knee: Principal | ICD-10-CM

## 2015-11-12 DIAGNOSIS — M25561 Pain in right knee: Secondary | ICD-10-CM | POA: Diagnosis not present

## 2015-11-12 DIAGNOSIS — R262 Difficulty in walking, not elsewhere classified: Secondary | ICD-10-CM | POA: Diagnosis not present

## 2015-11-12 NOTE — Therapy (Signed)
[place], [place], [address] Phone: [phone]   Fax:  [phone]  Physical Therapy Treatment  Patient Details  Name: Jessica [name] MRN: [id number] Date of Birth: October 12, 1961 Referring Provider: Aviva Kluver  Encounter Date: 11/12/2015      PT End of Session - 11/12/15 1206    Visit Number 7   Number of Visits 10   Date for PT Re-Evaluation 11/26/15   PT Start Time [id number]   PT Stop Time 1230   PT Time Calculation (min) 45 min      Past Medical History  Diagnosis Date  . Hypertension   . Obesity   . HSV (herpes simplex virus) infection   . Headache(784.0)   . Depression      on meds  . BV (bacterial vaginosis)     recent dx - finish med 12/22/11  . Insomnia     on meds  . Sleep apnea     occas. uses CPAP  . Anal fissure     history  . Arthritis     feet  . Migraines   . Angioedema of lips 03/21/2011    Presumed 2nd to ACE     Past Surgical History  Procedure Laterality Date  . Neck surgery  2001    cervical fusion C4/5  . Cesarean section      x 3  . Tubal ligation  1992  . Cholecystectomy    . Endometrial ablation    . Knee surgery  oct 2013    minesctomy and debridement    There were no vitals filed for this visit.  Visit Diagnosis:  No diagnosis found.      Subjective Assessment - 11/12/15 1155    Subjective Not having a good day. [place] on the way out of the dollar store. Her right knee gave way. She thinks that it might be the cold but she is having lots of pain in both knees. The left is hurting but the right is the one that is 7/10 pain.   Pain Score 7                          [facility] Adult PT Treatment/Exercise - 11/12/15 0001    Knee/Hip Exercises: Aerobic   Nustep L5 x 6 min   Knee/Hip Exercises: Supine   Short Arc Quad Sets 2 sets;15 reps   Straight Leg Raises Right;Left;15 reps   Other Supine Knee/Hip Exercises ball squeeze 10  second  holds 15 reps 1 set,    Other Supine Knee/Hip Exercises supine clams with red  band 2x15;  Marches with abdominal bracing 25 reps ;  Heel slides with therapy ball 20 reps;  SLR bilat 10 reps   Manual Therapy   Manual Therapy Taping Rt knee                PT Education - 11/12/15 1153    Education provided Yes   Education Details patient educated on value of using a cane to avoid falls, instructed on proper form for transitions into cars and up on curbs, and the importance of quad strength and its role in fall prevention, and in keeping knee from buckling.          PT Short Term Goals - 11/12/15 1211    PT SHORT TERM GOAL #1   Title She will be independent with inital HEP   Time 2   Period  Weeks   Status Achieved   PT SHORT TERM GOAL #2   Title She will report pain resolved LT knee   Time 2   Period Weeks   Status Achieved           PT Long Term Goals - 11/12/15 1211    PT LONG TERM GOAL #1   Title She will be independent with all HEP issued as of last visit   Time 5   Period Weeks   Status On-going   PT LONG TERM GOAL #2   Title No pain LT knee , RT knee pain decr 30% or more with walking and standing   Time 5   Period Weeks   Status On-going   PT LONG TERM GOAL #3   Title She will report walking in store improved with less need to lean on cart   Time 5   Period Weeks   Status On-going               Plan - 11/12/15 1209    Clinical Impression Statement Rt knee pain is a 7/10 and left knee pain has returned but not as much after she fell coming out of the [employer] tree a few days ago and fell because her right knee gave out. Focused on LE strengthening and provided taping to support the right knee and help facilitate the VMO for leg extention.   PT Next Visit Plan Check effectiveness of taping,Continue strength and cont [place] and ionto if  needed.  Maual or retaping if needed .         Problem List Patient Active Problem List   Diagnosis Date  Noted  . Acute sinusitis 05/31/2015  . Viral URI with cough 05/10/2015  . Primary osteoarthritis of right knee 02/19/2015  . Essential hypertension 02/19/2015  . Left-sided low back pain without sciatica 01/04/2015  . Polyuria 01/03/2015  . Left knee pain 10/31/2014  . Back pain 09/20/2014  . Chest pain 06/26/2014  . Abnormal EKG 06/26/2014  . Melasma 05/24/2014  . Insomnia 01/19/2014  . Hx Herpes simplex virus type 1 (HSV-1) dermatitis 07/22/2013  . Trigeminal neuralgia 01/18/2013  . Skin hypopigmentation 09/16/2012  . Possible exposure to STD 09/08/2012  . Blurry vision, bilateral 07/31/2012  . Leg pain 03/10/2012  . Menorrhagia 09/16/2011  . Allergic rhinitis 04/10/2011  . Glucose intolerance (pre-diabetes) 04/10/2011  . 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 Derangement of medial meniscus of right knee 02/06/2009  . HTN, goal below 140/90 09/27/2008  . VENOUS INSUFFICIENCY, LEGS 07/07/2008  . SLEEP APNEA, OBSTRUCTIVE 03/24/2008  . 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 ARTHRITIS, ACROMIOCLAVICULAR 11/24/2007  . ROTATOR CUFF SPRAIN AND STRAIN 11/24/2007  . MIGRAINE NOS W/O INTRACTABLE MIGRAINE 08/23/2007  . Morbid obesity ([place]) 06/04/2007  . DEPRESSIVE DISORDER, RCR, MODERATE 06/04/2007   Laury Axon, SPTA 11/12/2015 12:41 PM PHONE:[phone] [facility] [place], [place], [address] Phone: [phone]   Fax:  [phone]  Name: ANALEISE HARAKAL MRN: [id number] Date of Birth: [birth year]

## 2015-11-12 NOTE — Assessment & Plan Note (Signed)
Recent outbreak. 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 Will refill topical acyclovir

## 2015-11-12 NOTE — Assessment & Plan Note (Signed)
Will trial Zanaflex for short term management.

## 2015-11-12 NOTE — Assessment & Plan Note (Signed)
Patient right at cutoff for control. Will increase to amlodipine 10mg  daily.

## 2015-11-14 ENCOUNTER — Ambulatory Visit: Payer: Medicare Other

## 2015-11-14 DIAGNOSIS — M25562 Pain in left knee: Principal | ICD-10-CM

## 2015-11-14 DIAGNOSIS — R262 Difficulty in walking, not elsewhere classified: Secondary | ICD-10-CM

## 2015-11-14 DIAGNOSIS — M25561 Pain in right knee: Secondary | ICD-10-CM

## 2015-11-14 NOTE — Patient Instructions (Signed)
REmoval of tape if irritating and will complete PT in next 2 visits

## 2015-11-14 NOTE — Therapy (Addendum)
[place], [place], [address] Phone: [phone]   Fax:  [phone]  Physical Therapy Treatment  Patient Details  Name: Jessica [name] MRN: [id number] Date of Birth: 09-16-1961 Referring Provider: Aviva Kluver  Encounter Date: 11/14/2015      PT End of Session - 11/14/15 1218    Visit Number 8   Number of Visits 10   Date for PT Re-Evaluation 11/26/15   PT Start Time 1140   PT Stop Time 1230   PT Time Calculation (min) 50 min   Activity Tolerance Patient tolerated treatment well;Patient limited by pain   Behavior During Therapy [facility] for tasks assessed/performed      Past Medical History  Diagnosis Date  . Hypertension   . Obesity   . HSV (herpes simplex virus) infection   . Headache(784.0)   . Depression      on meds  . BV (bacterial vaginosis)     recent dx - finish med 12/22/11  . Insomnia     on meds  . Sleep apnea     occas. uses CPAP  . Anal fissure     history  . Arthritis     feet  . Migraines   . Angioedema of lips 03/21/2011    Presumed 2nd to ACE     Past Surgical History  Procedure Laterality Date  . Neck surgery  2001    cervical fusion C4/5  . Cesarean section      x 3  . Tubal ligation  1992  . Cholecystectomy    . Endometrial ablation    . Knee surgery  oct 2013    minesctomy and debridement    There were no vitals filed for this visit.  Visit Diagnosis:  Arthralgia of both knees  Difficulty walking      Subjective Assessment - 11/14/15 1143    Subjective MD said I had to lose 30 pounds to do TKA that is needed in RT knee. :I signed up at he [employer] for water aerobics.  Tired of living like this.    Currently in Pain? Yes   Pain Score --  6.5   Pain Location Knee   Pain Orientation Right   Pain Descriptors / Indicators Aching  Lt knee feels tight   Pain Type Chronic pain   Pain Onset More than a month ago   Pain Frequency Constant   Aggravating  Factors  weight bearing   Pain Relieving Factors meds , cold , heat   Multiple Pain Sites No                         [facility] Adult PT Treatment/Exercise - 11/14/15 1152    Ambulation/Gait   Assistive device Straight cane   Knee/Hip Exercises: Aerobic   Elliptical L4 8 min LE only   Knee/Hip Exercises: Supine   Short Arc Quad Sets Strengthening;Right;Left;3 sets;10 reps  10 sec hold   Straight Leg Raises Right;Left;10 reps;5 reps   Other Supine Knee/Hip Exercises ball squeeze 5  second holds 15 reps 1 sets,    Other Supine Knee/Hip Exercises supine clams with green  band 2x15; Marches with abdominal bracing; Manual adductor stretch 2 reps with 30 second holds   Manual Therapy   Manual therapy comments Retaped RT knee 2 C's around patella                PT Education - 11/14/15 1229  Education provided Yes   Education Details completion of episode of care   Person(s) Educated Patient   Methods Explanation   Comprehension Verbalized understanding          PT Short Term Goals - 11/12/15 1211    PT SHORT TERM GOAL #1   Title She will be independent with inital HEP   Time 2   Period Weeks   Status Achieved   PT SHORT TERM GOAL #2   Title She will report pain resolved LT knee   Time 2   Period Weeks   Status Achieved           PT Long Term Goals - 11/12/15 1211    PT LONG TERM GOAL #1   Title She will be independent with all HEP issued as of last visit   Time 5   Period Weeks   Status On-going   PT LONG TERM GOAL #2   Title No pain LT knee , RT knee pain decr 30% or more with walking and standing   Time 5   Period Weeks   Status On-going   PT LONG TERM GOAL #3   Title She will report walking in store improved with less need to lean on cart   Time 5   Period Weeks   Status On-going     Addendum:     G-Codes - Dec 30, 2015 1549    Functional Assessment Tool Used FOTO   Functional Limitation Mobility: Walking and moving around    Mobility: Walking and Moving Around Goal Status [id number]) At least 40 percent but less than 60 percent impaired, limited or restricted   Mobility: Walking and Moving Around Discharge Status [phone]) At least 60 percent but less than 80 percent impaired, limited or restricted               Plan - 11/14/15 1219    Clinical Impression Statement Her pain level in RT knee is not improving . She is able to do the exercises but with pain. She is about to start at the gym. Will finish planned visits and discharge to gym.    PT Next Visit Plan Continue strengthening an tape Discharge in 2 visits to gym   Consulted and Agree with Plan of Care Patient        Problem List Patient Active Problem List   Diagnosis Date Noted  . Acute sinusitis 05/31/2015  . Viral URI with cough 05/10/2015  . Primary osteoarthritis of right knee 02/19/2015  . Essential hypertension 02/19/2015  . Left-sided low back pain without sciatica 01/04/2015  . Polyuria 01/03/2015  . Left knee pain 10/31/2014  . Back pain 09/20/2014  . Chest pain 06/26/2014  . Abnormal EKG 06/26/2014  . Melasma 05/24/2014  . Insomnia 01/19/2014  . Hx Herpes simplex virus type 1 (HSV-1) dermatitis 07/22/2013  . Trigeminal neuralgia 01/18/2013  . Skin hypopigmentation 09/16/2012  . Possible exposure to STD 09/08/2012  . Blurry vision, bilateral 07/31/2012  . Leg pain 03/10/2012  . Menorrhagia 09/16/2011  . Allergic rhinitis 04/10/2011  . Glucose intolerance (pre-diabetes) 04/10/2011  . 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 Derangement of medial meniscus of right knee 02/06/2009  . HTN, goal below 140/90 09/27/2008  . VENOUS INSUFFICIENCY, LEGS 07/07/2008  . SLEEP APNEA, OBSTRUCTIVE 03/24/2008  . 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 ARTHRITIS, ACROMIOCLAVICULAR 11/24/2007  . ROTATOR CUFF SPRAIN AND STRAIN 11/24/2007  . MIGRAINE NOS W/O INTRACTABLE MIGRAINE 08/23/2007  . Morbid obesity (  [place]) 06/04/2007   . DEPRESSIVE DISORDER, RCR, MODERATE 06/04/2007    Darrel Hoover PT 11/14/2015, 12:32 PM  [facility] [address] [place], [place], [address] Phone: [phone]   Fax:  [phone]  Name: Jessica [name] MRN: [id number] Date of Birth: [birth year]    PHYSICAL THERAPY DISCHARGE SUMMARY  Visits from Start of Care: 8  Current functional level related to goals / functional outcomes: See above   Remaining deficits: Unknown as pt did not return   Education / Equipment: HEP  Plan: Patient agrees to discharge.  Patient goals were not met. Patient is being discharged due to not returning since the last visit.  ?????      Laureen Abrahams, PT, DPT 12/19/2015 3:51 PM  [facility] [address] [place] [address]  [phone] (office) [phone] (fax)

## 2015-12-26 ENCOUNTER — Other Ambulatory Visit: Payer: Self-pay | Admitting: *Deleted

## 2015-12-27 ENCOUNTER — Other Ambulatory Visit: Payer: Self-pay | Admitting: *Deleted

## 2015-12-27 ENCOUNTER — Ambulatory Visit (INDEPENDENT_AMBULATORY_CARE_PROVIDER_SITE_OTHER): Payer: Medicare Other | Admitting: Family Medicine

## 2015-12-27 ENCOUNTER — Encounter: Payer: Self-pay | Admitting: Family Medicine

## 2015-12-27 VITALS — BP 144/99 | HR 96 | Temp 98.7°F | Wt 269.0 lb

## 2015-12-27 DIAGNOSIS — M25511 Pain in right shoulder: Secondary | ICD-10-CM

## 2015-12-27 MED ORDER — BUPROPION HCL ER (XL) 150 MG PO TB24: 150.0000 mg | ORAL_TABLET | Freq: Every day | ORAL | Status: DC

## 2015-12-27 MED ORDER — METHYLPREDNISOLONE ACETATE 40 MG/ML IJ SUSP
40.0000 mg | Freq: Once | INTRAMUSCULAR | Status: AC
  Administered 2015-12-27: 40 mg via INTRA_ARTICULAR

## 2015-12-27 MED ORDER — POLYETHYLENE GLYCOL 3350 17 GM/SCOOP PO POWD: 17.0000 g | Freq: Every day | ORAL | Status: DC | PRN

## 2015-12-27 NOTE — Progress Notes (Signed)
    Subjective    Jessica [name] is a 54 y.o. female that presents for a follow-up visit for chronic issues.   1. Left arm pain: Symptoms started about one month ago. She then developed shooting pains from her arm to her neck. She developed some swelling along with numbness/tingling. She also has headaches. She does report decreased grip strength. Her arm pain has prevented her from performing activities, such as doing her hair, and wiping her buttocks. The swelling has improved. She states that she fell once and is unsure if that contributed to symptoms. She also states she has a history of neck injury and symptoms are similar to previous presentation. Her falls are related to her knees giving out on her after getting out of a car. She reports a history of carpal tunnel. She has tried heat which has helped with swelling.   Social History  Substance Use Topics  . Smoking status: Never Smoker   . Smokeless tobacco: Never Used  . Alcohol Use: No     Comment: socially    Allergies  Allergen Reactions  . Ace Inhibitors     Lip swelling with lisinopril  . Angiotensin Receptor Blockers     Lip swelling with ACE  . Latex Itching and Rash    No orders of the defined types were placed in this encounter.    ROS  Per HPI   Objective   BP 144/99 mmHg  Pulse 96  Temp(Src) 98.7 F (37.1 C) (Oral)  Wt 269 lb (122.018 kg)  General: Well appearing, no distress ulder with limited extension and flexion to just over 90 degrees with associated pain. Not able to reach behind her back compared to left side. Passive movement able to reach about 150 degrees before pain  Assessment and Plan    1. Right shoulder pain -Subacromial injection.  INJECTION: Patient was given informed consent, signed copy in the chart. Appropriate time out was taken. Area prepped and draped in usual sterile fashion. 1 cc of methylprednisolone 40 mg/ml plus  3 cc of 1% lidocaine without epinephrine was injected into  the subacromial bursa using a posterior approach. The patient tolerated the procedure well. There were no complications. Post procedure instructions were given.  - DG Shoulder Right; Future - methylPREDNISolone acetate (DEPO-MEDROL) injection 40 mg; Inject 1 mL (40 mg total) into the articular space once.

## 2015-12-27 NOTE — Patient Instructions (Signed)
Thank you for coming to see me today. It was a pleasure. Today we talked about:   Right shoulder pain: I gave you an injection. I am going to get an x-ray of your shoulder as well.   Please make an appointment to see me in two weeks for follow-up of your shoulder.  If you have any questions or concerns, please do not hesitate to call the office at [phone].  Sincerely,  Cordelia Poche, MD

## 2016-01-04 ENCOUNTER — Other Ambulatory Visit: Payer: Self-pay | Admitting: *Deleted

## 2016-01-08 ENCOUNTER — Other Ambulatory Visit: Payer: Self-pay | Admitting: Family Medicine

## 2016-01-08 MED ORDER — TIZANIDINE HCL 2 MG PO TABS: 2.0000 mg | ORAL_TABLET | Freq: Every day | ORAL | Status: DC

## 2016-01-08 NOTE — Telephone Encounter (Signed)
2nd request.  Gustave Lindeman L, RN  

## 2016-01-08 NOTE — Telephone Encounter (Signed)
Pt called because she said that the pharmacy has been trying to get refills on her medication. She didn't know which one the pharmacy is requesting but we did refill one today and that is the only I see that was submitted for refill unless they faxed this to [place]. jw

## 2016-01-08 NOTE — Telephone Encounter (Signed)
Unsure of what pharmacy is requesting what, however, yes, I did refill her tizanidine

## 2016-01-09 NOTE — Telephone Encounter (Signed)
[facility] and they stated that there were no other meds that they were showing to be refilled and that it was probably the medication that was filled yesterday. Katharina Caper, April D, [place]

## 2016-01-14 ENCOUNTER — Emergency Department (HOSPITAL_BASED_OUTPATIENT_CLINIC_OR_DEPARTMENT_OTHER): Payer: Medicare Other

## 2016-01-14 ENCOUNTER — Encounter (HOSPITAL_BASED_OUTPATIENT_CLINIC_OR_DEPARTMENT_OTHER): Payer: Self-pay | Admitting: *Deleted

## 2016-01-14 ENCOUNTER — Emergency Department (HOSPITAL_BASED_OUTPATIENT_CLINIC_OR_DEPARTMENT_OTHER)
Admission: EM | Admit: 2016-01-14 | Discharge: 2016-01-14 | Disposition: A | Discharge status: Home / Self Care | Payer: Medicare Other | Attending: Emergency Medicine | Admitting: Emergency Medicine

## 2016-01-14 DIAGNOSIS — F329 Major depressive disorder, single episode, unspecified: Secondary | ICD-10-CM | POA: Diagnosis not present

## 2016-01-14 DIAGNOSIS — R05 Cough: Secondary | ICD-10-CM | POA: Diagnosis not present

## 2016-01-14 DIAGNOSIS — Z7951 Long term (current) use of inhaled steroids: Secondary | ICD-10-CM | POA: Insufficient documentation

## 2016-01-14 DIAGNOSIS — H9209 Otalgia, unspecified ear: Secondary | ICD-10-CM | POA: Insufficient documentation

## 2016-01-14 DIAGNOSIS — G43909 Migraine, unspecified, not intractable, without status migrainosus: Secondary | ICD-10-CM | POA: Insufficient documentation

## 2016-01-14 DIAGNOSIS — H748X2 Other specified disorders of left middle ear and mastoid: Secondary | ICD-10-CM | POA: Diagnosis not present

## 2016-01-14 DIAGNOSIS — J069 Acute upper respiratory infection, unspecified: Secondary | ICD-10-CM | POA: Diagnosis not present

## 2016-01-14 DIAGNOSIS — R0602 Shortness of breath: Secondary | ICD-10-CM | POA: Diagnosis not present

## 2016-01-14 DIAGNOSIS — Z8742 Personal history of other diseases of the female genital tract: Secondary | ICD-10-CM | POA: Diagnosis not present

## 2016-01-14 DIAGNOSIS — Z8719 Personal history of other diseases of the digestive system: Secondary | ICD-10-CM | POA: Diagnosis not present

## 2016-01-14 DIAGNOSIS — Z791 Long term (current) use of non-steroidal anti-inflammatories (NSAID): Secondary | ICD-10-CM | POA: Insufficient documentation

## 2016-01-14 DIAGNOSIS — Z79899 Other long term (current) drug therapy: Secondary | ICD-10-CM | POA: Insufficient documentation

## 2016-01-14 DIAGNOSIS — Z91040 Latex allergy status: Secondary | ICD-10-CM | POA: Insufficient documentation

## 2016-01-14 DIAGNOSIS — Z8619 Personal history of other infectious and parasitic diseases: Secondary | ICD-10-CM | POA: Diagnosis not present

## 2016-01-14 DIAGNOSIS — R509 Fever, unspecified: Secondary | ICD-10-CM | POA: Diagnosis present

## 2016-01-14 DIAGNOSIS — G4730 Sleep apnea, unspecified: Secondary | ICD-10-CM | POA: Insufficient documentation

## 2016-01-14 DIAGNOSIS — I10 Essential (primary) hypertension: Secondary | ICD-10-CM | POA: Insufficient documentation

## 2016-01-14 DIAGNOSIS — M1990 Unspecified osteoarthritis, unspecified site: Secondary | ICD-10-CM | POA: Diagnosis not present

## 2016-01-14 DIAGNOSIS — E669 Obesity, unspecified: Secondary | ICD-10-CM | POA: Insufficient documentation

## 2016-01-14 MED ORDER — BENZONATATE 100 MG PO CAPS: 200.0000 mg | ORAL_CAPSULE | Freq: Two times a day (BID) | ORAL | Status: DC | PRN

## 2016-01-14 MED ORDER — OXYMETAZOLINE HCL 0.05 % NA SOLN: 1.0000 | Freq: Two times a day (BID) | NASAL | Status: DC

## 2016-01-14 MED FILL — NASAL DECONGESTANT 0.05% SP: 0.05 | 30 days supply | Qty: 15 | Fill #0

## 2016-01-14 MED FILL — BENZONATATE 100 MG CAPSULE: 100 | 5 days supply | Qty: 20 | Fill #0

## 2016-01-14 NOTE — ED Notes (Signed)
3 days of cough, sore throat, facial and neck pain. Yesterday she saw streaks of blood in her sputum.

## 2016-01-14 NOTE — ED Provider Notes (Signed)
CSN: [id number]     Arrival date & time 01/14/16  1158 History   First MD Initiated Contact with Patient 01/14/16 1253     Chief Complaint  Patient presents with  . URI     (Consider location/radiation/quality/duration/timing/severity/associated sxs/prior Treatment) HPI   Patient is a 55 year old female with past medical history of hypertension who presents to the ED with complaint of URI symptoms, onset one week. Endorses subjective fever, rhinorrhea, dry cough, sore throat, shortness of breath, wheezing, left ear pain, sinus pressure. She reports having one episode of a productive cough with small streaks of bright red blood in her sputum last night. She also reports having neck pain for the past 3-4 days which she describes as an intermittent aching pain, worse with movement, relieved with heat. Pt denies neck stiffness, visual changes, photophobia, CP, abdominal pain, N/V, urinary symptoms, numbness, tingling, weakness, seizures, syncope.  She notes she has been taking Mucinex at home with mild relief. Denies any sick contacts.   Past Medical History  Diagnosis Date  . Hypertension   . Obesity   . HSV (herpes simplex virus) infection   . Headache(784.0)   . Depression      on meds  . BV (bacterial vaginosis)     recent dx - finish med 12/22/11  . Insomnia     on meds  . Sleep apnea     occas. uses CPAP  . Anal fissure     history  . Arthritis     feet  . Migraines   . Angioedema of lips 03/21/2011    Presumed 2nd to ACE    Past Surgical History  Procedure Laterality Date  . Neck surgery  2001    cervical fusion C4/5  . Cesarean section      x 3  . Tubal ligation  1992  . Cholecystectomy    . Endometrial ablation    . Knee surgery  oct 2013    minesctomy and debridement   Family History  Problem Relation Age of Onset  . Diabetes Mother   . Diabetes Father   . Diabetes Sister   . Cancer Paternal Aunt     cevical  . Cancer Paternal Aunt     cervical  . Heart  attack Brother 52  . Heart failure Mother   . Hypertension Mother   . Stroke Mother    Social History  Substance Use Topics  . Smoking status: Never Smoker   . Smokeless tobacco: Never Used  . Alcohol Use: No     Comment: socially   OB History    Gravida Para Term Preterm AB TAB SAB Ectopic Multiple Living   3 3 3       3      Review of Systems  Constitutional: Positive for fever.  HENT: Positive for ear pain, rhinorrhea, sinus pressure and sore throat.   Respiratory: Positive for cough, shortness of breath and wheezing.   All other systems reviewed and are negative.     Allergies  Ace inhibitors; Angiotensin receptor blockers; and Latex  Home Medications   Prior to Admission medications   Medication Sig Start Date End Date Taking? Authorizing Provider  acetaminophen (TYLENOL) 500 MG tablet Take 500 mg by mouth every 6 (six) hours as needed for moderate pain (knee and back).    Historical Provider, MD  acyclovir ointment (ZOVIRAX) 5 % Apply 1 application topically every 3 (three) hours. Up to 6 times daily 11/04/15   Mariel Aloe,  MD  amLODipine (NORVASC) 10 MG tablet Take 1 tablet (10 mg total) by mouth daily. 11/07/15   Mariel Aloe, MD  benzonatate (TESSALON) 100 MG capsule Take 2 capsules (200 mg total) by mouth 2 (two) times daily as needed for cough. 01/14/16   Nona Dell, PA-C  buPROPion (WELLBUTRIN XL) 150 MG 24 hr tablet Take 1 tablet (150 mg total) by mouth daily. 12/27/15   Mariel Aloe, MD  cetirizine (ZYRTEC) 10 MG tablet Take 1 tablet (10 mg total) by mouth daily as needed for allergies. 07/22/13   Gerda Diss, MD  diclofenac sodium (VOLTAREN) 1 % GEL Apply 2 g topically 4 (four) times daily. 07/09/15   Mariel Aloe, MD  fluticasone (FLONASE) 50 MCG/ACT nasal spray Place 2 sprays into both nostrils daily. 06/01/15   Renee A Kuneff, DO  guaiFENesin (MUCINEX) 600 MG 12 hr tablet Take 1 tablet (600 mg total) by mouth 2 (two) times daily. Patient  taking differently: Take 600 mg by mouth 2 (two) times daily as needed.  07/23/15   Mariel Aloe, MD  ipratropium (ATROVENT) 0.06 % nasal spray Place 2 sprays into both nostrils 4 (four) times daily. Patient not taking: Reported on 11/07/2015 05/10/15   Hilton Sinclair, MD  LORazepam (ATIVAN) 1 MG tablet Take 1 tablet (1 mg total) by mouth once. Use 20 minutes before MRI imaging procedure. Patient not taking: Reported on 11/07/2015 10/15/15   Mariel Aloe, MD  meloxicam (MOBIC) 15 MG tablet Take 1 tablet (15 mg total) by mouth daily. 03/28/15   Bryan R Hess, DO  montelukast (SINGULAIR) 10 MG tablet Take 1 tablet (10 mg total) by mouth at bedtime. Patient taking differently: Take 10 mg by mouth at bedtime as needed.  07/24/14   Mariel Aloe, MD  naproxen sodium (ANAPROX) 220 MG tablet Take 220 mg by mouth 2 (two) times daily with a meal.    Historical Provider, MD  Ospemifene (OSPHENA) 60 MG TABS Take 60 mg by mouth daily. Patient not taking: Reported on 11/07/2015 02/23/15   Osborne Oman, MD  oxyCODONE-acetaminophen (PERCOCET/ROXICET) 5-325 MG tablet Take 1 tablet by mouth every 6 (six) hours as needed for severe pain. Patient not taking: Reported on 11/07/2015 09/26/15   Burna Cash Rumley, DO  oxymetazoline (AFRIN NASAL SPRAY) 0.05 % nasal spray Place 1 spray into both nostrils 2 (two) times daily. 01/14/16   Nona Dell, PA-C  polyethylene glycol powder [facility]) powder Take 17 g by mouth daily as needed for moderate constipation or severe constipation. 12/27/15   Mariel Aloe, MD  tiZANidine (ZANAFLEX) 2 MG tablet Take 1 tablet (2 mg total) by mouth at bedtime. 01/08/16   Mariel Aloe, MD  topiramate (TOPAMAX) 50 MG tablet Take 1 tablet (50 mg total) by mouth 2 (two) times daily. 11/15/14   Mariel Aloe, MD  traMADol (ULTRAM) 50 MG tablet Take 1 tablet (50 mg total) by mouth every 8 (eight) hours as needed. Patient not taking: Reported on 11/07/2015 03/28/15   Nolon Rod, DO  triamcinolone (NASACORT AQ) 55 MCG/ACT AERO nasal inhaler Place 2 sprays into the nose daily. Patient not taking: Reported on 11/07/2015 05/23/15   Mariel Aloe, MD  triamterene-hydrochlorothiazide (DYAZIDE) 37.5-25 MG per capsule Take 1 each (1 capsule total) by mouth daily. Patient not taking: Reported on 11/07/2015 07/23/15   Mariel Aloe, MD  valACYclovir (VALTREX) 500 MG tablet Take 1 tablet (500 mg total)  by mouth daily. Patient taking differently: Take 500 mg by mouth daily as needed.  11/04/15   Mariel Aloe, MD   BP 133/81 mmHg  Pulse 81  Temp(Src) 98.1 F (36.7 C) (Oral)  Resp 18  Ht 5\' 1"  (1.549 m)  Wt 122.018 kg  BMI 50.85 kg/m2  SpO2 99% Physical Exam  Constitutional: She is oriented to person, place, and time. She appears well-developed and well-nourished. No distress.  HENT:  Head: Normocephalic and atraumatic.  Right Ear: No middle ear effusion.  Left Ear: A middle ear effusion is present.  Nose: Rhinorrhea present. Right sinus exhibits no maxillary sinus tenderness and no frontal sinus tenderness. Left sinus exhibits no maxillary sinus tenderness and no frontal sinus tenderness.  Mouth/Throat: Uvula is midline, oropharynx is clear and moist and mucous membranes are normal. No oropharyngeal exudate.  Eyes: Conjunctivae and EOM are normal. Pupils are equal, round, and reactive to light. Right eye exhibits no discharge. Left eye exhibits no discharge. No scleral icterus.  Neck: Normal range of motion. Neck supple.  Cardiovascular: Normal rate, regular rhythm, normal heart sounds and intact distal pulses.   Pulmonary/Chest: Effort normal and breath sounds normal. No respiratory distress. She has no wheezes. She has no rales. She exhibits no tenderness.  Abdominal: Soft. Bowel sounds are normal. She exhibits no distension and no mass. There is no tenderness. There is no rebound and no guarding.  Musculoskeletal: Normal range of motion. She exhibits no edema.   Lymphadenopathy:    She has no cervical adenopathy.  Neurological: She is alert and oriented to person, place, and time. She has normal strength. No cranial nerve deficit or sensory deficit. Coordination normal.  Skin: Skin is warm and dry. She is not diaphoretic.  Nursing note and vitals reviewed.   ED Course  Procedures (including critical care time) Labs Review Labs Reviewed - No data to display  Imaging Review Dg Chest 2 View  01/14/2016  CLINICAL DATA:  Cough and shortness of breath for 1 week EXAM: CHEST  2 VIEW COMPARISON:  May 18, 2015 FINDINGS: The heart size and mediastinal contours are within normal limits. Both lungs are clear. The visualized skeletal structures are unremarkable. IMPRESSION: No active cardiopulmonary disease. Electronically Signed   By: Dorise Bullion III M.D   On: 01/14/2016 13:48   I have personally reviewed and evaluated these images and lab results as part of my medical decision-making.  Filed Vitals:   01/14/16 1205 01/14/16 1440  BP: 149/82 133/81  Pulse: 78 81  Temp: 98.1 F (36.7 C)   Resp: 20 18     MDM   Final diagnoses:  URI (upper respiratory infection)    Patient presents with URI symptoms. VSS. On exam left middle ear effusion noted, rhinorrhea present. Lungs CTAB. Exam otherwise unremarkable. Chest x-ray negative. I suspect pt's sxs are likely due to viral URI and do not feel that any further workup or imaging is warranted at this time. Plan to d/c pt home with decongestant and antitussive. Pt given resource guide to follow up with PCP.  Evaluation does not show pathology requring ongoing emergent intervention or admission. Pt is hemodynamically stable and mentating appropriately. Discussed findings/results and plan with patient/guardian, who agrees with plan. All questions answered. Return precautions discussed and outpatient follow up given.      Chesley Noon [place], [place] 01/14/16 1656  Blanchie Dessert, MD 01/18/16  2053

## 2016-01-14 NOTE — Discharge Instructions (Signed)
Take your medications as prescribed. Follow-up with your primary care provider in the next 3-4 days. Return to the emergency department if symptoms worsen or new onset of fever, chest pain, difficulty breathing, coughing up blood, vomiting blood.

## 2016-01-21 ENCOUNTER — Ambulatory Visit (INDEPENDENT_AMBULATORY_CARE_PROVIDER_SITE_OTHER): Payer: Medicare Other | Admitting: Family Medicine

## 2016-01-21 ENCOUNTER — Encounter: Payer: Self-pay | Admitting: Family Medicine

## 2016-01-21 VITALS — BP 144/90 | HR 73 | Temp 98.3°F | Wt 271.0 lb

## 2016-01-21 DIAGNOSIS — J0110 Acute frontal sinusitis, unspecified: Secondary | ICD-10-CM | POA: Diagnosis not present

## 2016-01-21 MED ORDER — AMOXICILLIN-POT CLAVULANATE 875-125 MG PO TABS: 1.0000 | ORAL_TABLET | Freq: Two times a day (BID) | ORAL | Status: DC

## 2016-01-21 MED ORDER — CETIRIZINE HCL 10 MG PO TABS: 10.0000 mg | ORAL_TABLET | Freq: Every day | ORAL | Status: DC

## 2016-01-21 MED ORDER — FLUTICASONE PROPIONATE 50 MCG/ACT NA SUSP: 2.0000 | Freq: Every day | NASAL | Status: DC

## 2016-01-21 NOTE — Progress Notes (Signed)
Date of Visit: 01/21/2016   HPI:  Patient presents for a same day appointment to discuss coughing.  Has had productive cough for about 3 weeks. Also pain in throat, but that's better. Still feels congested. Is no better. No fevers. Has had decreased appetite. Stooling and urinating normally. Sputum is beige and does not have any blood (initially had small strands of blood, resolved). Denies chest pain or shortness of breath, just has cough. Of note, she stopped her zyrtec and flonase about 3 weeks ago, when this all began.  ROS: See HPI  [place]: history of arthritis, depression, hypertension, prediabetes, HSV1, morbid obesity,OSA, trigeminal neuralgia  PHYSICAL EXAM: BP 144/90 mmHg  Pulse 73  Temp(Src) 98.3 F (36.8 C) (Oral)  Wt 271 lb (122.925 kg) Gen: NAD, pleasant, cooperative HEENT: normocephalic, atraumatic. moist mucous membranes. oropharynx clear and moist. No anterior cervical lad. No sinus tenderness to palpation. Visualized tympanic membranes appear normal bilaterally. Heart: regular rate and rhythm, no murmur Lungs: clear to auscultation bilaterally, normal work of breathing  Abdomen: soft nttp Neuro: alert, grossly nonfocal, speech normal  ASSESSMENT/PLAN:  1. Congestion & cough - at this time favor allergic etiology, given timeline of stopping zyrtec & flonase at the same time the symptoms began. Encouraged resumption of these medications. Will also provide printed rx for augmentin, to be used in 3 days if symptoms have not improved with zyrtec & flonase. Patient agreeable to this plan.   FOLLOW UP: Follow up as needed if symptoms worsen or fail to improve.    [place]. Ardelia Mems, [place]

## 2016-01-21 NOTE — Patient Instructions (Signed)
Restart zyrtec & flonase. Use these every day.  If not better in 3 days, can start antibiotic. Printed this for you If you do take the antibiotic and end up with a yeast infection, call us and we can send something in for you.  Be well, Dr. Ardelia Mems

## 2016-02-18 ENCOUNTER — Encounter: Payer: Self-pay | Admitting: Family Medicine

## 2016-02-18 ENCOUNTER — Ambulatory Visit (INDEPENDENT_AMBULATORY_CARE_PROVIDER_SITE_OTHER): Payer: Medicare Other | Admitting: Family Medicine

## 2016-02-18 VITALS — BP 136/72 | HR 96 | Temp 99.0°F | Wt 270.2 lb

## 2016-02-18 DIAGNOSIS — J209 Acute bronchitis, unspecified: Secondary | ICD-10-CM | POA: Diagnosis present

## 2016-02-18 MED ORDER — ALBUTEROL SULFATE HFA 108 (90 BASE) MCG/ACT IN AERS: 2.0000 | INHALATION_SPRAY | Freq: Four times a day (QID) | RESPIRATORY_TRACT | Status: DC | PRN

## 2016-02-18 NOTE — Progress Notes (Signed)
Subjective: Jessica [name] is a 55 y.o. female with a history of allergic rhinitis, OSA on CPAP, and morbid obesity presenting for cough.  She reports "not feeling good" for 3 days, specifically significant cough/hoarseness with beige sputum, nausea, diarrhea. No vomiting, felt hot last night. No temperature. + wheezing with dyspnea, though not using an inhaler and technically no Dx of asthma. No chest pain. Non smoker, no sick contacts, didn't get flu shot. She had similar symptoms 3 weeks ago that improved somewhat with antibiotics but she's continued to have this cough. OTC cough suppressants have not helped. Has continued zyrtec and flonase without improvement.   - ROS: All other pertinent systems reviewed and are negative.  Objective: BP 136/72 mmHg  Pulse 96  Temp(Src) 99 F (37.2 C) (Oral)  Wt 270 lb 3.2 oz (122.562 kg)  SpO2 98% Gen:  55 y.o. female in no distress HEENT: MMM, TMs with normal position without inflammation or effusion. EOMI, PERRL, conjunctivae normal. Oropharynx clear CV: RRR, no murmur, no JVD Resp: Non-labored, coarse breath sounds without signs of consolidation Skin: No exanthem noted  Assessment & Plan: Jessica [name] is a 55 y.o. female with acute bronchitis.   With prolonged cough following URI symptoms, suspect viral etiology. No respiratory distress/fever indicating need for CBC or PCT or CXR at this time.  - Rx albuterol prn wheezing/dyspnea. Consider PFTs or further w/u if this continues as she has no formal Dx of obstructive pulmonary disease.  - Advised supportive measures and return precautions.

## 2016-02-18 NOTE — Patient Instructions (Addendum)
Use the albuterol inhaler as directed  Acute Bronchitis Bronchitis is inflammation of the airways that extend from the windpipe into the lungs (bronchi). The inflammation often causes mucus to develop. This leads to a cough, which is the most common symptom of bronchitis.  In acute bronchitis, the condition usually develops suddenly and goes away over time, usually in a couple weeks. Smoking, allergies, and asthma can make bronchitis worse. Repeated episodes of bronchitis may cause further lung problems.  CAUSES Acute bronchitis is most often caused by the same virus that causes a cold. The virus can spread from person to person (contagious) through coughing, sneezing, and touching contaminated objects. SIGNS AND SYMPTOMS   Cough.   Fever.   Coughing up mucus.   Body aches.   Chest congestion.   Chills.   Shortness of breath.   Sore throat.  DIAGNOSIS  Acute bronchitis is usually diagnosed through a physical exam. Your health care provider will also ask you questions about your medical history. Tests, such as chest X-rays, are sometimes done to rule out other conditions.  TREATMENT  Acute bronchitis usually goes away in a couple weeks. Oftentimes, no medical treatment is necessary. In some cases, an inhaler may be recommended to help reduce shortness of breath and control the cough. A cool mist vaporizer may also be used to help thin bronchial secretions and make it easier to clear the chest.  HOME CARE INSTRUCTIONS  Get plenty of rest.   Drink enough fluids to keep your urine clear or pale yellow (unless you have a medical condition that requires fluid restriction). Increasing fluids may help thin your respiratory secretions (sputum) and reduce chest congestion, and it will prevent dehydration.   Take medicines only as directed by your health care provider.  If you were prescribed an antibiotic medicine, finish it all even if you start to feel better.  Avoid smoking  and secondhand smoke. Exposure to cigarette smoke or irritating chemicals will make bronchitis worse. If you are a smoker, consider using nicotine gum or skin patches to help control withdrawal symptoms. Quitting smoking will help your lungs heal faster.   Reduce the chances of another bout of acute bronchitis by washing your hands frequently, avoiding people with cold symptoms, and trying not to touch your hands to your mouth, nose, or eyes.   Keep all follow-up visits as directed by your health care provider.  SEEK MEDICAL CARE IF: Your symptoms do not improve after 1 week of treatment.  SEEK IMMEDIATE MEDICAL CARE IF:  You develop an increased fever or chills.   You have chest pain.   You have severe shortness of breath.  You have bloody sputum.   You develop dehydration.  You faint or repeatedly feel like you are going to pass out.  You develop repeated vomiting.  You develop a severe headache. MAKE SURE YOU:   Understand these instructions.  Will watch your condition.  Will get help right away if you are not doing well or get worse.   This information is not intended to replace advice given to you by your health care provider. Make sure you discuss any questions you have with your health care provider.   Document Released: 01/22/2005 Document Revised: 01/05/2015 Document Reviewed: 06/07/2013 Elsevier Interactive Patient Education [employer].

## 2016-03-12 ENCOUNTER — Ambulatory Visit (INDEPENDENT_AMBULATORY_CARE_PROVIDER_SITE_OTHER): Payer: Medicare Other | Admitting: Family Medicine

## 2016-03-12 ENCOUNTER — Encounter: Payer: Self-pay | Admitting: Family Medicine

## 2016-03-12 DIAGNOSIS — R7309 Other abnormal glucose: Secondary | ICD-10-CM | POA: Diagnosis not present

## 2016-03-12 DIAGNOSIS — M5412 Radiculopathy, cervical region: Secondary | ICD-10-CM | POA: Insufficient documentation

## 2016-03-12 DIAGNOSIS — R358 Other polyuria: Secondary | ICD-10-CM | POA: Diagnosis not present

## 2016-03-12 DIAGNOSIS — S43421S Sprain of right rotator cuff capsule, sequela: Secondary | ICD-10-CM | POA: Diagnosis not present

## 2016-03-12 DIAGNOSIS — R739 Hyperglycemia, unspecified: Secondary | ICD-10-CM | POA: Diagnosis not present

## 2016-03-12 DIAGNOSIS — I10 Essential (primary) hypertension: Secondary | ICD-10-CM

## 2016-03-12 DIAGNOSIS — F331 Major depressive disorder, recurrent, moderate: Secondary | ICD-10-CM

## 2016-03-12 DIAGNOSIS — R3589 Other polyuria: Secondary | ICD-10-CM

## 2016-03-12 HISTORY — DX: Radiculopathy, cervical region: M54.12

## 2016-03-12 LAB — BASIC METABOLIC PANEL
BUN: 11 mg/dL (ref 7–25)
CALCIUM: 9.7 mg/dL (ref 8.6–10.4)
CO2: 23 mmol/L (ref 20–31)
Chloride: 102 mmol/L (ref 98–110)
Creat: 0.75 mg/dL (ref 0.50–1.05)
GLUCOSE: 91 mg/dL (ref 65–99)
POTASSIUM: 4 mmol/L (ref 3.5–5.3)
SODIUM: 136 mmol/L (ref 135–146)

## 2016-03-12 LAB — POCT URINALYSIS DIPSTICK
BILIRUBIN UA: NEGATIVE
Glucose, UA: NEGATIVE
Ketones, UA: NEGATIVE
LEUKOCYTES UA: NEGATIVE
NITRITE UA: NEGATIVE
PH UA: 8
PROTEIN UA: NEGATIVE
RBC UA: NEGATIVE
Spec Grav, UA: 1.015
Urobilinogen, UA: 0.2

## 2016-03-12 LAB — POCT GLYCOSYLATED HEMOGLOBIN (HGB A1C): Hemoglobin A1C: 5.5

## 2016-03-12 MED ORDER — DULOXETINE HCL 30 MG PO CPEP: 30.0000 mg | ORAL_CAPSULE | Freq: Every day | ORAL | Status: DC

## 2016-03-12 NOTE — Assessment & Plan Note (Signed)
She may have an element of right sided rotator cuff syndrome, but her clinical picture best fits with cervical radiculopathy.  Not on any neuropathic pain meds.  Does not recall being on gabapentin.  Is not anxious to jump to MRI.

## 2016-03-12 NOTE — Assessment & Plan Note (Signed)
Switch welbutrin to cymbalta in the hope we will get neuropathic pain relief.

## 2016-03-12 NOTE — Assessment & Plan Note (Signed)
Screen for DM 

## 2016-03-12 NOTE — Assessment & Plan Note (Signed)
Check for dm

## 2016-03-12 NOTE — Assessment & Plan Note (Signed)
Will check for DM

## 2016-03-12 NOTE — Assessment & Plan Note (Signed)
Mild in right shoulder and I do not believe this explains all her right arm pain.

## 2016-03-12 NOTE — Patient Instructions (Signed)
Stop your welbutrin.  Start cymbalta=duloxetine for depression. Please keep the appointment for counseling. The duloxetine also treats nerve pain - I hope it helps your arm I will call with the lab test results. See Dr. Lonny Prude in one month.  You may need additional medicine changes and possibly and MRI of your neck.

## 2016-03-12 NOTE — Progress Notes (Signed)
   Subjective:    Patient ID: Jessica [name], female    DOB: 1961-12-26, 55 y.o.   MRN: [id number]  HPI Three major issues: 1. C/O polyuria and wants to be checked for DM.  This seems reasonable given wt.  I also note that polyuria is on her problem list as a chronic problem. 2. Right shoulder elbow, arm and hand pain.  Pain is described as stabbing.  Also complains of some numbness and hints at some weakness.  Feels shoulder pain is the worst.  Only when questioned does she admit to chronic neck pain.  She has a long ago neck injury and fusion surgery.  Now that she is reminded, her neck is worse than usual.  Denies left arm, leg, bowel or bladder problems. 3. Only when I observed that she looked sad did she admit to depression.  She states that she has an appointment for counseling tomorrow.  No HI or SI.  As I am not her PCP, I did not go into depth about this depression.    Review of Systems     Objective:   Physical Exam Normal ROM strength and DTRs of both upper extremeties.  No numbness on my exam.  Does have + empty can test for rotator cuff syndrome on right.        Assessment & Plan:

## 2016-03-13 ENCOUNTER — Other Ambulatory Visit: Payer: Self-pay | Admitting: *Deleted

## 2016-03-13 MED ORDER — TIZANIDINE HCL 2 MG PO TABS: 2.0000 mg | ORAL_TABLET | Freq: Every day | ORAL | Status: DC

## 2016-03-19 ENCOUNTER — Telehealth: Payer: Self-pay

## 2016-03-19 NOTE — Telephone Encounter (Signed)
Called patient and left message to contact [facility] concerning release of information to [employer] ( [employer].   [employer] requested records from [facility] but failed to produce the proper release information. Informed patient that she will have to sign the proper release forms either at [facility] or [employer]Jessica [name]

## 2016-03-25 ENCOUNTER — Other Ambulatory Visit: Payer: Self-pay | Admitting: Family Medicine

## 2016-03-25 ENCOUNTER — Telehealth: Payer: Self-pay | Admitting: Family Medicine

## 2016-03-25 MED ORDER — MONTELUKAST SODIUM 10 MG PO TABS: 10.0000 mg | ORAL_TABLET | Freq: Every day | ORAL | Status: DC

## 2016-03-25 NOTE — Telephone Encounter (Signed)
Pt returned call abd was told that a refill was sent in. jw

## 2016-03-25 NOTE — Telephone Encounter (Signed)
Refill sent to pharmacy.   

## 2016-03-25 NOTE — Telephone Encounter (Signed)
LVM for pt to call the office. If pt calls, please give her the information below. Sharon T Saunders, CMA  

## 2016-03-25 NOTE — Telephone Encounter (Signed)
Pt called and needs a refill on her singular called in. jw

## 2016-04-07 ENCOUNTER — Other Ambulatory Visit: Payer: Self-pay | Admitting: Family Medicine

## 2016-04-07 NOTE — Telephone Encounter (Signed)
Need refill on the duloxetine due to going out of town today and would like to have medication filled before leaving.

## 2016-04-07 NOTE — Telephone Encounter (Signed)
Patient already has three refills from last prescription sent to pharmacy in March

## 2016-04-08 NOTE — Telephone Encounter (Signed)
Called pt. Pt states [facility] told her she had no refills. I called [facility] and the Rx is in the computer. Verified [facility] can [facility] any [facility] and they can [facility] the [facility]. Informed pt. Ottis Stain, CMA

## 2016-04-15 ENCOUNTER — Encounter: Payer: Self-pay | Admitting: Family Medicine

## 2016-04-15 ENCOUNTER — Ambulatory Visit (INDEPENDENT_AMBULATORY_CARE_PROVIDER_SITE_OTHER): Payer: Medicare Other | Admitting: Family Medicine

## 2016-04-15 VITALS — BP 124/78 | HR 78 | Temp 98.3°F | Ht 61.0 in | Wt 270.0 lb

## 2016-04-15 DIAGNOSIS — M79641 Pain in right hand: Secondary | ICD-10-CM | POA: Diagnosis present

## 2016-04-15 MED ORDER — NAPROXEN 500 MG PO TABS: 500.0000 mg | ORAL_TABLET | Freq: Two times a day (BID) | ORAL | Status: DC

## 2016-04-15 NOTE — Progress Notes (Signed)
   Subjective:    Patient ID: Jessica [name], female    DOB: 12-22-1961, 55 y.o.   MRN: [id number]  Seen for Same day visit for   CC: Right hand pain  Having arm and hand pain.  This is an old pain.  There is burning pain.  Last night she wasn't able to sleep.  Has tried ice which helped some.  Pain isn't as bad as last night.  Is intermittent in nature.  Pain starts in her left shoulder and radiates on the posterior aspect of her arm.  No recent trauma or injury.  She works with special needs which requires lifting.  Has tried ibuprofen and aleve and minimal relief.  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  RIght shoulder x-ray in 2003 with no abnoramlity.  Hemoglobin A1c showing 5.5 on 03/12/2016 No history of thyroid disease  She has a history of carpal tunnel syndrome and has used a cock-up splint in the past. She has the cock-up splint with her but it looks to be worn out.  Review of Systems   See HPI for ROS. Objective:  BP 124/78 mmHg  Pulse 78  Temp(Src) 98.3 F (36.8 C) (Oral)  Ht 5\' 1"  (1.549 m)  Wt 270 lb (122.471 kg)  BMI 51.04 kg/m2  General: NAD Skin: warm and dry, no rashes noted Neuro: alert and oriented, no focal deficits Thumb/hand Exam:  Laterality: right Appearance: No obvious defects, no atrophy observed Palpation: Some pain with palpation of the thenar eminence Range of Motion: Normal range of motion in all planes Maneuvers: Finkelstein's: Negative Tinel's: Negative Phalen's: Negative Strength:  Wrist extension: 5/5 Wrist flexion: 5/5 Ulnar Deviation: 5/5 Radial Deviation: 5/5  Neurovascularly intact     Assessment & Plan:   Right hand pain I viewed the median nerve on her right side and her left side and a look comparable in nature. There is little to no flattening and no fluid around the nerve. Possible that she has some radiculopathy with a history of C-spine fusion. She also has several pain  problems in her problem list which may point to the direction of fibromyalgia in nature.  - will proved naproxen for a week  - advised to use a cock up splint and ice on the area  - Counseled about weight loss and that this will help her ongoing pain in different areas  - Also encouraged regular activity and movement  - If there is no improvement could consider an injection

## 2016-04-15 NOTE — Patient Instructions (Signed)
Thank you for coming in,   I have sent in naproxen which is an anti-inflammatory.   You can try to rub capsaicin cream over the top which will help.   You can also use a cock up splint and especially at night.   Please bring all of your medications with you to each visit.   Sign up for My Chart to have easy access to your labs results, and communication with your Primary care physician   Please feel free to call with any questions or concerns at any time, at [phone]. --Dr. Raeford Razor

## 2016-04-16 DIAGNOSIS — M79641 Pain in right hand: Secondary | ICD-10-CM | POA: Insufficient documentation

## 2016-04-16 NOTE — Assessment & Plan Note (Signed)
I viewed the median nerve on her right side and her left side and a look comparable in nature. There is little to no flattening and no fluid around the nerve. Possible that she has some radiculopathy with a history of C-spine fusion. She also has several pain problems in her problem list which may point to the direction of fibromyalgia in nature.  - will proved naproxen for a week  - advised to use a cock up splint and ice on the area  - Counseled about weight loss and that this will help her ongoing pain in different areas  - Also encouraged regular activity and movement  - If there is no improvement could consider an injection

## 2016-04-21 ENCOUNTER — Other Ambulatory Visit: Payer: Self-pay | Admitting: *Deleted

## 2016-04-21 ENCOUNTER — Other Ambulatory Visit: Payer: Self-pay

## 2016-04-21 DIAGNOSIS — Z1231 Encounter for screening mammogram for malignant neoplasm of breast: Secondary | ICD-10-CM

## 2016-04-21 MED ORDER — BUPROPION HCL ER (XL) 150 MG PO TB24: 150.0000 mg | ORAL_TABLET | Freq: Every day | ORAL | Status: DC

## 2016-04-25 ENCOUNTER — Ambulatory Visit (INDEPENDENT_AMBULATORY_CARE_PROVIDER_SITE_OTHER): Payer: Medicare Other | Admitting: Family Medicine

## 2016-04-25 ENCOUNTER — Encounter: Payer: Self-pay | Admitting: Family Medicine

## 2016-04-25 VITALS — BP 130/79 | HR 75 | Temp 98.3°F | Wt 270.2 lb

## 2016-04-25 DIAGNOSIS — M79641 Pain in right hand: Secondary | ICD-10-CM | POA: Diagnosis not present

## 2016-04-25 DIAGNOSIS — Z6841 Body Mass Index (BMI) 40.0 and over, adult: Secondary | ICD-10-CM | POA: Diagnosis not present

## 2016-04-25 DIAGNOSIS — F331 Major depressive disorder, recurrent, moderate: Secondary | ICD-10-CM | POA: Diagnosis not present

## 2016-04-25 DIAGNOSIS — M25511 Pain in right shoulder: Secondary | ICD-10-CM | POA: Diagnosis present

## 2016-04-25 DIAGNOSIS — I10 Essential (primary) hypertension: Secondary | ICD-10-CM

## 2016-04-25 MED ORDER — METHYLPREDNISOLONE ACETATE 40 MG/ML IJ SUSP
40.0000 mg | Freq: Once | INTRAMUSCULAR | Status: AC
  Administered 2016-04-25: 40 mg via INTRA_ARTICULAR

## 2016-04-25 NOTE — Assessment & Plan Note (Addendum)
Ljkely secondary to work. Trouble reaching back and combing hair.  INJECTION: Patient was given informed consent, signed copy in the chart. Appropriate time out was taken. Area prepped and draped in usual sterile fashion. 1 cc of methylprednisolone 40 mg/ml plus  3 cc of 1% lidocaine without epinephrine was injected into the subacromial bursa using a posterior approach. The patient tolerated the procedure well. There were no complications. Post procedure instructions were given.

## 2016-04-25 NOTE — Progress Notes (Signed)
    Subjective    Jessica [name] is a 55 y.o. female that presents for a follow-up visit for:   1. Right hand pain: Patient was seen about one and a half weeks ago. She has been with a client, which includes lifting, all of last week. Generally she only works on the weekend and shifts last about 10 hours. She was found to have some fluid in her wrist and thought to have a carpal tunnel. She has been using Naproxen which has not helped much. She has not tried a cock-up splint.   2. Weight: This is a chronic issue. She has been trying to lose weight for many years. She has changed her diet and decreased fatty food intake, in addition to decreasing portions. She walks semi-vigorously multiple times per week but is limited by her knee pain.   3. Hypertension: patient adherent with amlodipine 10mg  and Dyazide 37.5-25mg  daily. No chest pain or dyspnea.  4. Depression: Patient is adherent with Cymbalta 30mg  and states her mood is good. No suicidal ideation  Social History  Substance Use Topics  . Smoking status: Never Smoker   . Smokeless tobacco: Never Used  . Alcohol Use: No     Comment: socially    Allergies  Allergen Reactions  . Ace Inhibitors     Lip swelling with lisinopril  . Angiotensin Receptor Blockers     Lip swelling with ACE  . Latex Itching and Rash    No orders of the defined types were placed in this encounter.    ROS  Per HPI   Objective   BP 130/79 mmHg  Pulse 75  Temp(Src) 98.3 F (36.8 C) (Oral)  Wt 270 lb 3.2 oz (122.562 kg)  Vital signs reviewed  General: Well appearing, no distres Musculoskeletal: Right shoulder: no deformity or tenderness. Hawkin, empty can negative. Mild pain with reaching behind back. Active ROM equal.  5/5 stregnth Right wrist: appears similar to left. No tenderness or deformity. 5/5 strength. Tinnel and Phalen signs negative  Assessment and Plan    Morbid obesity [facility]) Patient states she was denied for bariatric surgery by  surgeon from [facility] and is unsure why. Will refer to surgery in [place] for follow-up and evaluation for bariatric surgery. Will also refer to Dr. Einar Gip for weight loss pills as she is requesting this.   Pain in joint of right shoulder Ljkely secondary to work. Trouble reaching back and combing hair.  INJECTION: Patient was given informed consent, signed copy in the chart. Appropriate time out was taken. Area prepped and draped in usual sterile fashion. 1 cc of methylprednisolone 40 mg/ml plus  3 cc of 1% lidocaine without epinephrine was injected into the subacromial bursa using a posterior approach. The patient tolerated the procedure well. There were no complications. Post procedure instructions were given.  Right hand pain Possibly related to carpal tunnel but could be secondary to repeated motion and lifting during job. Will trial a cock-up splint but likely needs to decrease load at work.  HTN, goal below 140/90 Controlled. No changes.  DEPRESSIVE DISORDER, RCR, MODERATE Controlled on Cymbalta. Does not wish to increase today. Monitor

## 2016-04-25 NOTE — Assessment & Plan Note (Signed)
Possibly related to carpal tunnel but could be secondary to repeated motion and lifting during job. Will trial a cock-up splint but likely needs to decrease load at work.

## 2016-04-25 NOTE — Assessment & Plan Note (Signed)
Patient states she was denied for bariatric surgery by surgeon from [facility] and is unsure why. Will refer to surgery in [place] for follow-up and evaluation for bariatric surgery. Will also refer to Dr. Einar Gip for weight loss pills as she is requesting this.

## 2016-04-25 NOTE — Patient Instructions (Signed)
Thank you for coming to see me today. It was a pleasure. Today we talked about:   Shoulder pain: I gave you an injection. I hope this helps. With regard to your hand and arm pain, please pick up the cock-up splint from a pharmacy. If any problems, let me know  Weight: I will refer you to Dr. Einar Gip for weight loss pills and surgery for bariatric surgery  Depression: I am glad the Cymbalta is helping you.  Please make an appointment to see your new doctor in 3-6 months.  If you have any questions or concerns, please do not hesitate to call the office at [phone].  Sincerely,  Cordelia Poche, MD

## 2016-04-25 NOTE — Assessment & Plan Note (Signed)
Controlled. No changes. 

## 2016-04-25 NOTE — Assessment & Plan Note (Signed)
Controlled on Cymbalta. Does not wish to increase today. Monitor

## 2016-05-08 ENCOUNTER — Ambulatory Visit
Admission: RE | Admit: 2016-05-08 | Discharge: 2016-05-08 | Disposition: A | Discharge status: Home / Self Care | Payer: Medicare Other | Source: Ambulatory Visit

## 2016-05-08 DIAGNOSIS — Z1231 Encounter for screening mammogram for malignant neoplasm of breast: Secondary | ICD-10-CM

## 2016-05-10 ENCOUNTER — Other Ambulatory Visit: Payer: Self-pay | Admitting: Family Medicine

## 2016-05-21 DIAGNOSIS — I10 Essential (primary) hypertension: Secondary | ICD-10-CM | POA: Diagnosis not present

## 2016-05-21 DIAGNOSIS — G4733 Obstructive sleep apnea (adult) (pediatric): Secondary | ICD-10-CM | POA: Diagnosis not present

## 2016-06-25 ENCOUNTER — Encounter: Payer: Self-pay | Admitting: Family Medicine

## 2016-06-25 NOTE — Progress Notes (Signed)
Patient started on phentermine 37.5 mg daily by Roda Shutters at [facility] on 05/21/2016

## 2016-07-07 ENCOUNTER — Telehealth: Payer: Self-pay | Admitting: Student

## 2016-07-07 NOTE — Telephone Encounter (Signed)
Form placed in PCP box for completion. Zimmerman Rumple, Sereena Marando D, CMA  

## 2016-07-07 NOTE — Telephone Encounter (Signed)
Pt dropped off a form for the doctor to fill out for her bariatric surgery. Please call patient when ready to pick up. Form was place in the white teams folder up front. jw

## 2016-07-08 ENCOUNTER — Other Ambulatory Visit: Payer: Self-pay | Admitting: *Deleted

## 2016-07-08 DIAGNOSIS — Z9989 Dependence on other enabling machines and devices: Secondary | ICD-10-CM | POA: Diagnosis not present

## 2016-07-08 DIAGNOSIS — F329 Major depressive disorder, single episode, unspecified: Secondary | ICD-10-CM | POA: Diagnosis not present

## 2016-07-08 DIAGNOSIS — R7301 Impaired fasting glucose: Secondary | ICD-10-CM | POA: Diagnosis not present

## 2016-07-08 DIAGNOSIS — J4520 Mild intermittent asthma, uncomplicated: Secondary | ICD-10-CM | POA: Diagnosis not present

## 2016-07-08 DIAGNOSIS — F331 Major depressive disorder, recurrent, moderate: Secondary | ICD-10-CM

## 2016-07-08 DIAGNOSIS — G4733 Obstructive sleep apnea (adult) (pediatric): Secondary | ICD-10-CM | POA: Diagnosis not present

## 2016-07-08 DIAGNOSIS — I10 Essential (primary) hypertension: Secondary | ICD-10-CM | POA: Diagnosis not present

## 2016-07-08 DIAGNOSIS — K5900 Constipation, unspecified: Secondary | ICD-10-CM | POA: Diagnosis not present

## 2016-07-08 DIAGNOSIS — Z6841 Body Mass Index (BMI) 40.0 and over, adult: Secondary | ICD-10-CM | POA: Diagnosis not present

## 2016-07-08 DIAGNOSIS — M899 Disorder of bone, unspecified: Secondary | ICD-10-CM | POA: Diagnosis not present

## 2016-07-08 MED ORDER — DULOXETINE HCL 30 MG PO CPEP: 30.0000 mg | ORAL_CAPSULE | Freq: Every day | ORAL | Status: DC

## 2016-07-09 NOTE — Telephone Encounter (Signed)
Patient informed that form is complete and ready for pick up. Patient request that form also be faxed to Bariatric and General Surgery office at [phone].  Derl Barrow, RN

## 2016-07-09 NOTE — Telephone Encounter (Signed)
Release form received by fax from [facility]. Bariatric surgery supportt letter (form) filld, signed and left in Tamika's box.

## 2016-07-10 DIAGNOSIS — G4733 Obstructive sleep apnea (adult) (pediatric): Secondary | ICD-10-CM | POA: Diagnosis not present

## 2016-07-10 DIAGNOSIS — I10 Essential (primary) hypertension: Secondary | ICD-10-CM | POA: Diagnosis not present

## 2016-07-29 ENCOUNTER — Other Ambulatory Visit: Payer: Self-pay | Admitting: *Deleted

## 2016-07-29 MED ORDER — CETIRIZINE HCL 10 MG PO TABS: ORAL_TABLET | ORAL | 1 refills | Status: DC

## 2016-08-05 DIAGNOSIS — G4733 Obstructive sleep apnea (adult) (pediatric): Secondary | ICD-10-CM | POA: Diagnosis not present

## 2016-08-05 DIAGNOSIS — Z9989 Dependence on other enabling machines and devices: Secondary | ICD-10-CM | POA: Diagnosis not present

## 2016-08-05 DIAGNOSIS — Z136 Encounter for screening for cardiovascular disorders: Secondary | ICD-10-CM | POA: Diagnosis not present

## 2016-08-05 DIAGNOSIS — Z6841 Body Mass Index (BMI) 40.0 and over, adult: Secondary | ICD-10-CM | POA: Diagnosis not present

## 2016-08-07 ENCOUNTER — Other Ambulatory Visit: Payer: Self-pay | Admitting: Student

## 2016-08-07 DIAGNOSIS — E559 Vitamin D deficiency, unspecified: Secondary | ICD-10-CM | POA: Diagnosis not present

## 2016-08-07 DIAGNOSIS — Z6841 Body Mass Index (BMI) 40.0 and over, adult: Secondary | ICD-10-CM | POA: Diagnosis not present

## 2016-08-07 NOTE — Telephone Encounter (Signed)
Pt would like a refill on her Topamax. Please call in medication to [facility] on [address].Please call pt after it has been called in. Thanks! ep

## 2016-08-08 ENCOUNTER — Other Ambulatory Visit: Payer: Self-pay | Admitting: *Deleted

## 2016-08-08 MED ORDER — TRIAMTERENE-HCTZ 37.5-25 MG PO CAPS: 1.0000 | ORAL_CAPSULE | Freq: Every day | ORAL | 3 refills | Status: DC

## 2016-08-08 MED ORDER — TOPIRAMATE 50 MG PO TABS: 50.0000 mg | ORAL_TABLET | Freq: Two times a day (BID) | ORAL | 1 refills | Status: DC

## 2016-08-18 DIAGNOSIS — F419 Anxiety disorder, unspecified: Secondary | ICD-10-CM | POA: Diagnosis not present

## 2016-08-18 DIAGNOSIS — F54 Psychological and behavioral factors associated with disorders or diseases classified elsewhere: Secondary | ICD-10-CM | POA: Diagnosis not present

## 2016-08-18 DIAGNOSIS — Z7189 Other specified counseling: Secondary | ICD-10-CM | POA: Diagnosis not present

## 2016-08-18 DIAGNOSIS — Z6841 Body Mass Index (BMI) 40.0 and over, adult: Secondary | ICD-10-CM | POA: Diagnosis not present

## 2016-08-18 DIAGNOSIS — F329 Major depressive disorder, single episode, unspecified: Secondary | ICD-10-CM | POA: Diagnosis not present

## 2016-08-26 ENCOUNTER — Other Ambulatory Visit (HOSPITAL_COMMUNITY)
Admission: RE | Admit: 2016-08-26 | Discharge: 2016-08-26 | Disposition: A | Discharge status: Home / Self Care | Payer: Medicare Other | Source: Ambulatory Visit | Attending: Obstetrics & Gynecology | Admitting: Obstetrics & Gynecology

## 2016-08-26 ENCOUNTER — Encounter: Payer: Self-pay | Admitting: Obstetrics & Gynecology

## 2016-08-26 ENCOUNTER — Ambulatory Visit (INDEPENDENT_AMBULATORY_CARE_PROVIDER_SITE_OTHER): Payer: Medicare Other | Admitting: Obstetrics & Gynecology

## 2016-08-26 VITALS — BP 143/83 | HR 75 | Ht 61.0 in | Wt 258.0 lb

## 2016-08-26 DIAGNOSIS — R87810 Cervical high risk human papillomavirus (HPV) DNA test positive: Secondary | ICD-10-CM | POA: Diagnosis not present

## 2016-08-26 DIAGNOSIS — N63 Unspecified lump in breast: Secondary | ICD-10-CM

## 2016-08-26 DIAGNOSIS — N6310 Unspecified lump in the right breast, unspecified quadrant: Secondary | ICD-10-CM

## 2016-08-26 DIAGNOSIS — N760 Acute vaginitis: Secondary | ICD-10-CM | POA: Insufficient documentation

## 2016-08-26 DIAGNOSIS — Z124 Encounter for screening for malignant neoplasm of cervix: Secondary | ICD-10-CM | POA: Diagnosis not present

## 2016-08-26 DIAGNOSIS — Z113 Encounter for screening for infections with a predominantly sexual mode of transmission: Secondary | ICD-10-CM | POA: Diagnosis not present

## 2016-08-26 DIAGNOSIS — Z1151 Encounter for screening for human papillomavirus (HPV): Secondary | ICD-10-CM | POA: Diagnosis not present

## 2016-08-26 DIAGNOSIS — N941 Unspecified dyspareunia: Secondary | ICD-10-CM

## 2016-08-26 DIAGNOSIS — Z01419 Encounter for gynecological examination (general) (routine) without abnormal findings: Secondary | ICD-10-CM | POA: Diagnosis not present

## 2016-08-26 DIAGNOSIS — Z01411 Encounter for gynecological examination (general) (routine) with abnormal findings: Secondary | ICD-10-CM

## 2016-08-26 NOTE — Patient Instructions (Signed)
Preventive Care for Adults, Female A healthy lifestyle and preventive care can promote health and wellness. Preventive health guidelines for women include the following key practices.  A routine yearly physical is a good way to check with your health care provider about your health and preventive screening. It is a chance to share any concerns and updates on your health and to receive a thorough exam.  Visit your dentist for a routine exam and preventive care every 6 months. Brush your teeth twice a day and floss once a day. Good oral hygiene prevents tooth decay and gum disease.  The frequency of eye exams is based on your age, health, family medical history, use of contact lenses, and other factors. Follow your health care provider's recommendations for frequency of eye exams.  Eat a healthy diet. Foods like vegetables, fruits, whole grains, low-fat dairy products, and lean protein foods contain the nutrients you need without too many calories. Decrease your intake of foods high in solid fats, added sugars, and salt. Eat the right amount of calories for you.Get information about a proper diet from your health care provider, if necessary.  Regular physical exercise is one of the most important things you can do for your health. Most adults should get at least 150 minutes of moderate-intensity exercise (any activity that increases your heart rate and causes you to sweat) each week. In addition, most adults need muscle-strengthening exercises on 2 or more days a week.  Maintain a healthy weight. The body mass index (BMI) is a screening tool to identify possible weight problems. It provides an estimate of body fat based on height and weight. Your health care provider can find your BMI and can help you achieve or maintain a healthy weight.For adults 20 years and older:  A BMI below 18.5 is considered underweight.  A BMI of 18.5 to 24.9 is normal.  A BMI of 25 to 29.9 is considered overweight.  A  BMI of 30 and above is considered obese.  Maintain normal blood lipids and cholesterol levels by exercising and minimizing your intake of saturated fat. Eat a balanced diet with plenty of fruit and vegetables. Blood tests for lipids and cholesterol should begin at age 45 and be repeated every 5 years. If your lipid or cholesterol levels are high, you are over 50, or you are at high risk for heart disease, you may need your cholesterol levels checked more frequently.Ongoing high lipid and cholesterol levels should be treated with medicines if diet and exercise are not working.  If you smoke, find out from your health care provider how to quit. If you do not use tobacco, do not start.  Lung cancer screening is recommended for adults aged 45-80 years who are at high risk for developing lung cancer because of a history of smoking. A yearly low-dose CT scan of the lungs is recommended for people who have at least a 30-pack-year history of smoking and are a current smoker or have quit within the past 15 years. A pack year of smoking is smoking an average of 1 pack of cigarettes a day for 1 year (for example: 1 pack a day for 30 years or 2 packs a day for 15 years). Yearly screening should continue until the smoker has stopped smoking for at least 15 years. Yearly screening should be stopped for people who develop a health problem that would prevent them from having lung cancer treatment.  If you are pregnant, do not drink alcohol. If you are  breastfeeding, be very cautious about drinking alcohol. If you are not pregnant and choose to drink alcohol, do not have more than 1 drink per day. One drink is considered to be 12 ounces (355 mL) of beer, 5 ounces (148 mL) of wine, or 1.5 ounces (44 mL) of liquor.  Avoid use of street drugs. Do not share needles with anyone. Ask for help if you need support or instructions about stopping the use of drugs.  High blood pressure causes heart disease and increases the risk  of stroke. Your blood pressure should be checked at least every 1 to 2 years. Ongoing high blood pressure should be treated with medicines if weight loss and exercise do not work.  If you are 55-79 years old, ask your health care provider if you should take aspirin to prevent strokes.  Diabetes screening is done by taking a blood sample to check your blood glucose level after you have not eaten for a certain period of time (fasting). If you are not overweight and you do not have risk factors for diabetes, you should be screened once every 3 years starting at age 45. If you are overweight or obese and you are 40-70 years of age, you should be screened for diabetes every year as part of your cardiovascular risk assessment.  Breast cancer screening is essential preventive care for women. You should practice "breast self-awareness." This means understanding the normal appearance and feel of your breasts and may include breast self-examination. Any changes detected, no matter how small, should be reported to a health care provider. Women in their 20s and 30s should have a clinical breast exam (CBE) by a health care provider as part of a regular health exam every 1 to 3 years. After age 40, women should have a CBE every year. Starting at age 40, women should consider having a mammogram (breast X-ray test) every year. Women who have a family history of breast cancer should talk to their health care provider about genetic screening. Women at a high risk of breast cancer should talk to their health care providers about having an MRI and a mammogram every year.  Breast cancer gene (BRCA)-related cancer risk assessment is recommended for women who have family members with BRCA-related cancers. BRCA-related cancers include breast, ovarian, tubal, and peritoneal cancers. Having family members with these cancers may be associated with an increased risk for harmful changes (mutations) in the breast cancer genes BRCA1 and  BRCA2. Results of the assessment will determine the need for genetic counseling and BRCA1 and BRCA2 testing.  Your health care provider may recommend that you be screened regularly for cancer of the pelvic organs (ovaries, uterus, and vagina). This screening involves a pelvic examination, including checking for microscopic changes to the surface of your cervix (Pap test). You may be encouraged to have this screening done every 3 years, beginning at age 21.  For women ages 30-65, health care providers may recommend pelvic exams and Pap testing every 3 years, or they may recommend the Pap and pelvic exam, combined with testing for human papilloma virus (HPV), every 5 years. Some types of HPV increase your risk of cervical cancer. Testing for HPV may also be done on women of any age with unclear Pap test results.  Other health care providers may not recommend any screening for nonpregnant women who are considered low risk for pelvic cancer and who do not have symptoms. Ask your health care provider if a screening pelvic exam is right for   you.  If you have had past treatment for cervical cancer or a condition that could lead to cancer, you need Pap tests and screening for cancer for at least 20 years after your treatment. If Pap tests have been discontinued, your risk factors (such as having a new sexual partner) need to be reassessed to determine if screening should resume. Some women have medical problems that increase the chance of getting cervical cancer. In these cases, your health care provider may recommend more frequent screening and Pap tests.  Colorectal cancer can be detected and often prevented. Most routine colorectal cancer screening begins at the age of 50 years and continues through age 75 years. However, your health care provider may recommend screening at an earlier age if you have risk factors for colon cancer. On a yearly basis, your health care provider may provide home test kits to check  for hidden blood in the stool. Use of a small camera at the end of a tube, to directly examine the colon (sigmoidoscopy or colonoscopy), can detect the earliest forms of colorectal cancer. Talk to your health care provider about this at age 50, when routine screening begins. Direct exam of the colon should be repeated every 5-10 years through age 75 years, unless early forms of precancerous polyps or small growths are found.  People who are at an increased risk for hepatitis B should be screened for this virus. You are considered at high risk for hepatitis B if:  You were born in a country where hepatitis B occurs often. Talk with your health care provider about which countries are considered high risk.  Your parents were born in a high-risk country and you have not received a shot to protect against hepatitis B (hepatitis B vaccine).  You have HIV or AIDS.  You use needles to inject street drugs.  You live with, or have sex with, someone who has hepatitis B.  You get hemodialysis treatment.  You take certain medicines for conditions like cancer, organ transplantation, and autoimmune conditions.  Hepatitis C blood testing is recommended for all people born from 1945 through 1965 and any individual with known risks for hepatitis C.  Practice safe sex. Use condoms and avoid high-risk sexual practices to reduce the spread of sexually transmitted infections (STIs). STIs include gonorrhea, chlamydia, syphilis, trichomonas, herpes, HPV, and human immunodeficiency virus (HIV). Herpes, HIV, and HPV are viral illnesses that have no cure. They can result in disability, cancer, and death.  You should be screened for sexually transmitted illnesses (STIs) including gonorrhea and chlamydia if:  You are sexually active and are younger than 24 years.  You are older than 24 years and your health care provider tells you that you are at risk for this type of infection.  Your sexual activity has changed  since you were last screened and you are at an increased risk for chlamydia or gonorrhea. Ask your health care provider if you are at risk.  If you are at risk of being infected with HIV, it is recommended that you take a prescription medicine daily to prevent HIV infection. This is called preexposure prophylaxis (PrEP). You are considered at risk if:  You are sexually active and do not regularly use condoms or know the HIV status of your partner(s).  You take drugs by injection.  You are sexually active with a partner who has HIV.  Talk with your health care provider about whether you are at high risk of being infected with HIV. If   you choose to begin PrEP, you should first be tested for HIV. You should then be tested every 3 months for as long as you are taking PrEP.  Osteoporosis is a disease in which the bones lose minerals and strength with aging. This can result in serious bone fractures or breaks. The risk of osteoporosis can be identified using a bone density scan. Women ages 67 years and over and women at risk for fractures or osteoporosis should discuss screening with their health care providers. Ask your health care provider whether you should take a calcium supplement or vitamin D to reduce the rate of osteoporosis.  Menopause can be associated with physical symptoms and risks. Hormone replacement therapy is available to decrease symptoms and risks. You should talk to your health care provider about whether hormone replacement therapy is right for you.  Use sunscreen. Apply sunscreen liberally and repeatedly throughout the day. You should seek shade when your shadow is shorter than you. Protect yourself by wearing long sleeves, pants, a wide-brimmed hat, and sunglasses year round, whenever you are outdoors.  Once a month, do a whole body skin exam, using a mirror to look at the skin on your back. Tell your health care provider of new moles, moles that have irregular borders, moles that  are larger than a pencil eraser, or moles that have changed in shape or color.  Stay current with required vaccines (immunizations).  Influenza vaccine. All adults should be immunized every year.  Tetanus, diphtheria, and acellular pertussis (Td, Tdap) vaccine. Pregnant women should receive 1 dose of Tdap vaccine during each pregnancy. The dose should be obtained regardless of the length of time since the last dose. Immunization is preferred during the 27th-36th week of gestation. An adult who has not previously received Tdap or who does not know her vaccine status should receive 1 dose of Tdap. This initial dose should be followed by tetanus and diphtheria toxoids (Td) booster doses every 10 years. Adults with an unknown or incomplete history of completing a 3-dose immunization series with Td-containing vaccines should begin or complete a primary immunization series including a Tdap dose. Adults should receive a Td booster every 10 years.  Varicella vaccine. An adult without evidence of immunity to varicella should receive 2 doses or a second dose if she has previously received 1 dose. Pregnant females who do not have evidence of immunity should receive the first dose after pregnancy. This first dose should be obtained before leaving the health care facility. The second dose should be obtained 4-8 weeks after the first dose.  Human papillomavirus (HPV) vaccine. Females aged 13-26 years who have not received the vaccine previously should obtain the 3-dose series. The vaccine is not recommended for use in pregnant females. However, pregnancy testing is not needed before receiving a dose. If a female is found to be pregnant after receiving a dose, no treatment is needed. In that case, the remaining doses should be delayed until after the pregnancy. Immunization is recommended for any person with an immunocompromised condition through the age of 61 years if she did not get any or all doses earlier. During the  3-dose series, the second dose should be obtained 4-8 weeks after the first dose. The third dose should be obtained 24 weeks after the first dose and 16 weeks after the second dose.  Zoster vaccine. One dose is recommended for adults aged 30 years or older unless certain conditions are present.  Measles, mumps, and rubella (MMR) vaccine. Adults born  before 1957 generally are considered immune to measles and mumps. Adults born in 1957 or later should have 1 or more doses of MMR vaccine unless there is a contraindication to the vaccine or there is laboratory evidence of immunity to each of the three diseases. A routine second dose of MMR vaccine should be obtained at least 28 days after the first dose for students attending postsecondary schools, health care workers, or international travelers. People who received inactivated measles vaccine or an unknown type of measles vaccine during 1963-1967 should receive 2 doses of MMR vaccine. People who received inactivated mumps vaccine or an unknown type of mumps vaccine before 1979 and are at high risk for mumps infection should consider immunization with 2 doses of MMR vaccine. For females of childbearing age, rubella immunity should be determined. If there is no evidence of immunity, females who are not pregnant should be vaccinated. If there is no evidence of immunity, females who are pregnant should delay immunization until after pregnancy. Unvaccinated health care workers born before 1957 who lack laboratory evidence of measles, mumps, or rubella immunity or laboratory confirmation of disease should consider measles and mumps immunization with 2 doses of MMR vaccine or rubella immunization with 1 dose of MMR vaccine.  Pneumococcal 13-valent conjugate (PCV13) vaccine. When indicated, a person who is uncertain of his immunization history and has no record of immunization should receive the PCV13 vaccine. All adults 65 years of age and older should receive this  vaccine. An adult aged 19 years or older who has certain medical conditions and has not been previously immunized should receive 1 dose of PCV13 vaccine. This PCV13 should be followed with a dose of pneumococcal polysaccharide (PPSV23) vaccine. Adults who are at high risk for pneumococcal disease should obtain the PPSV23 vaccine at least 8 weeks after the dose of PCV13 vaccine. Adults older than 53 years of age who have normal immune system function should obtain the PPSV23 vaccine dose at least 1 year after the dose of PCV13 vaccine.  Pneumococcal polysaccharide (PPSV23) vaccine. When PCV13 is also indicated, PCV13 should be obtained first. All adults aged 65 years and older should be immunized. An adult younger than age 65 years who has certain medical conditions should be immunized. Any person who resides in a nursing home or long-term care facility should be immunized. An adult smoker should be immunized. People with an immunocompromised condition and certain other conditions should receive both PCV13 and PPSV23 vaccines. People with human immunodeficiency virus (HIV) infection should be immunized as soon as possible after diagnosis. Immunization during chemotherapy or radiation therapy should be avoided. Routine use of PPSV23 vaccine is not recommended for American Indians, [place] Natives, or people younger than 65 years unless there are medical conditions that require PPSV23 vaccine. When indicated, people who have unknown immunization and have no record of immunization should receive PPSV23 vaccine. One-time revaccination 5 years after the first dose of PPSV23 is recommended for people aged 19-64 years who have chronic kidney failure, nephrotic syndrome, asplenia, or immunocompromised conditions. People who received 1-2 doses of PPSV23 before age 65 years should receive another dose of PPSV23 vaccine at age 65 years or later if at least 5 years have passed since the previous dose. Doses of PPSV23 are not  needed for people immunized with PPSV23 at or after age 65 years.  Meningococcal vaccine. Adults with asplenia or persistent complement component deficiencies should receive 2 doses of quadrivalent meningococcal conjugate (MenACWY-D) vaccine. The doses should be obtained   at least 2 months apart. Microbiologists working with certain meningococcal bacteria, [place] recruits, people at risk during an outbreak, and people who travel to or live in countries with a high rate of meningitis should be immunized. A first-year college student up through age 34 years who is living in a residence hall should receive a dose if she did not receive a dose on or after her 16th birthday. Adults who have certain high-risk conditions should receive one or more doses of vaccine.  Hepatitis A vaccine. Adults who wish to be protected from this disease, have certain high-risk conditions, work with hepatitis A-infected animals, work in hepatitis A research labs, or travel to or work in countries with a high rate of hepatitis A should be immunized. Adults who were previously unvaccinated and who anticipate close contact with an international adoptee during the first 60 days after arrival in the [place] from a country with a high rate of hepatitis A should be immunized.  Hepatitis B vaccine. Adults who wish to be protected from this disease, have certain high-risk conditions, may be exposed to blood or other infectious body fluids, are household contacts or sex partners of hepatitis B positive people, are clients or workers in certain care facilities, or travel to or work in countries with a high rate of hepatitis B should be immunized.  Haemophilus influenzae type b (Hib) vaccine. A previously unvaccinated person with asplenia or sickle cell disease or having a scheduled splenectomy should receive 1 dose of Hib vaccine. Regardless of previous immunization, a recipient of a hematopoietic stem cell transplant should receive a  3-dose series 6-12 months after her successful transplant. Hib vaccine is not recommended for adults with HIV infection. Preventive Services / Frequency Ages 35 to 4 years  Blood pressure check.** / Every 3-5 years.  Lipid and cholesterol check.** / Every 5 years beginning at age 60.  Clinical breast exam.** / Every 3 years for women in their 71s and 10s.  BRCA-related cancer risk assessment.** / For women who have family members with a BRCA-related cancer (breast, ovarian, tubal, or peritoneal cancers).  Pap test.** / Every 2 years from ages 76 through 26. Every 3 years starting at age 61 through age 76 or 93 with a history of 3 consecutive normal Pap tests.  HPV screening.** / Every 3 years from ages 37 through ages 60 to 51 with a history of 3 consecutive normal Pap tests.  Hepatitis C blood test.** / For any individual with known risks for hepatitis C.  Skin self-exam. / Monthly.  Influenza vaccine. / Every year.  Tetanus, diphtheria, and acellular pertussis (Tdap, Td) vaccine.** / Consult your health care provider. Pregnant women should receive 1 dose of Tdap vaccine during each pregnancy. 1 dose of Td every 10 years.  Varicella vaccine.** / Consult your health care provider. Pregnant females who do not have evidence of immunity should receive the first dose after pregnancy.  HPV vaccine. / 3 doses over 6 months, if 93 and younger. The vaccine is not recommended for use in pregnant females. However, pregnancy testing is not needed before receiving a dose.  Measles, mumps, rubella (MMR) vaccine.** / You need at least 1 dose of MMR if you were born in 1957 or later. You may also need a 2nd dose. For females of childbearing age, rubella immunity should be determined. If there is no evidence of immunity, females who are not pregnant should be vaccinated. If there is no evidence of immunity, females who are  pregnant should delay immunization until after pregnancy.  Pneumococcal  13-valent conjugate (PCV13) vaccine.** / Consult your health care provider.  Pneumococcal polysaccharide (PPSV23) vaccine.** / 1 to 2 doses if you smoke cigarettes or if you have certain conditions.  Meningococcal vaccine.** / 1 dose if you are age 68 to 8 years and a Market researcher living in a residence hall, or have one of several medical conditions, you need to get vaccinated against meningococcal disease. You may also need additional booster doses.  Hepatitis A vaccine.** / Consult your health care provider.  Hepatitis B vaccine.** / Consult your health care provider.  Haemophilus influenzae type b (Hib) vaccine.** / Consult your health care provider. Ages 7 to 53 years  Blood pressure check.** / Every year.  Lipid and cholesterol check.** / Every 5 years beginning at age 25 years.  Lung cancer screening. / Every year if you are aged 11-80 years and have a 30-pack-year history of smoking and currently smoke or have quit within the past 15 years. Yearly screening is stopped once you have quit smoking for at least 15 years or develop a health problem that would prevent you from having lung cancer treatment.  Clinical breast exam.** / Every year after age 48 years.  BRCA-related cancer risk assessment.** / For women who have family members with a BRCA-related cancer (breast, ovarian, tubal, or peritoneal cancers).  Mammogram.** / Every year beginning at age 41 years and continuing for as long as you are in good health. Consult with your health care provider.  Pap test.** / Every 3 years starting at age 65 years through age 37 or 70 years with a history of 3 consecutive normal Pap tests.  HPV screening.** / Every 3 years from ages 72 years through ages 60 to 40 years with a history of 3 consecutive normal Pap tests.  Fecal occult blood test (FOBT) of stool. / Every year beginning at age 21 years and continuing until age 5 years. You may not need to do this test if you get  a colonoscopy every 10 years.  Flexible sigmoidoscopy or colonoscopy.** / Every 5 years for a flexible sigmoidoscopy or every 10 years for a colonoscopy beginning at age 35 years and continuing until age 48 years.  Hepatitis C blood test.** / For all people born from [birth year] through 1965 and any individual with known risks for hepatitis C.  Skin self-exam. / Monthly.  Influenza vaccine. / Every year.  Tetanus, diphtheria, and acellular pertussis (Tdap/Td) vaccine.** / Consult your health care provider. Pregnant women should receive 1 dose of Tdap vaccine during each pregnancy. 1 dose of Td every 10 years.  Varicella vaccine.** / Consult your health care provider. Pregnant females who do not have evidence of immunity should receive the first dose after pregnancy.  Zoster vaccine.** / 1 dose for adults aged 30 years or older.  Measles, mumps, rubella (MMR) vaccine.** / You need at least 1 dose of MMR if you were born in 1957 or later. You may also need a second dose. For females of childbearing age, rubella immunity should be determined. If there is no evidence of immunity, females who are not pregnant should be vaccinated. If there is no evidence of immunity, females who are pregnant should delay immunization until after pregnancy.  Pneumococcal 13-valent conjugate (PCV13) vaccine.** / Consult your health care provider.  Pneumococcal polysaccharide (PPSV23) vaccine.** / 1 to 2 doses if you smoke cigarettes or if you have certain conditions.  Meningococcal vaccine.** /  Consult your health care provider.  Hepatitis A vaccine.** / Consult your health care provider.  Hepatitis B vaccine.** / Consult your health care provider.  Haemophilus influenzae type b (Hib) vaccine.** / Consult your health care provider. Ages 64 years and over  Blood pressure check.** / Every year.  Lipid and cholesterol check.** / Every 5 years beginning at age 23 years.  Lung cancer screening. / Every year if you  are aged 16-80 years and have a 30-pack-year history of smoking and currently smoke or have quit within the past 15 years. Yearly screening is stopped once you have quit smoking for at least 15 years or develop a health problem that would prevent you from having lung cancer treatment.  Clinical breast exam.** / Every year after age 74 years.  BRCA-related cancer risk assessment.** / For women who have family members with a BRCA-related cancer (breast, ovarian, tubal, or peritoneal cancers).  Mammogram.** / Every year beginning at age 44 years and continuing for as long as you are in good health. Consult with your health care provider.  Pap test.** / Every 3 years starting at age 58 years through age 22 or 39 years with 3 consecutive normal Pap tests. Testing can be stopped between 65 and 70 years with 3 consecutive normal Pap tests and no abnormal Pap or HPV tests in the past 10 years.  HPV screening.** / Every 3 years from ages 64 years through ages 70 or 61 years with a history of 3 consecutive normal Pap tests. Testing can be stopped between 65 and 70 years with 3 consecutive normal Pap tests and no abnormal Pap or HPV tests in the past 10 years.  Fecal occult blood test (FOBT) of stool. / Every year beginning at age 40 years and continuing until age 27 years. You may not need to do this test if you get a colonoscopy every 10 years.  Flexible sigmoidoscopy or colonoscopy.** / Every 5 years for a flexible sigmoidoscopy or every 10 years for a colonoscopy beginning at age 7 years and continuing until age 32 years.  Hepatitis C blood test.** / For all people born from [birth year] through 1963 and any individual with known risks for hepatitis C.  Osteoporosis screening.** / A one-time screening for women ages 30 years and over and women at risk for fractures or osteoporosis.  Skin self-exam. / Monthly.  Influenza vaccine. / Every year.  Tetanus, diphtheria, and acellular pertussis (Tdap/Td)  vaccine.** / 1 dose of Td every 10 years.  Varicella vaccine.** / Consult your health care provider.  Zoster vaccine.** / 1 dose for adults aged 35 years or older.  Pneumococcal 13-valent conjugate (PCV13) vaccine.** / Consult your health care provider.  Pneumococcal polysaccharide (PPSV23) vaccine.** / 1 dose for all adults aged 46 years and older.  Meningococcal vaccine.** / Consult your health care provider.  Hepatitis A vaccine.** / Consult your health care provider.  Hepatitis B vaccine.** / Consult your health care provider.  Haemophilus influenzae type b (Hib) vaccine.** / Consult your health care provider. ** Family history and personal history of risk and conditions may change your health care provider's recommendations.   This information is not intended to replace advice given to you by your health care provider. Make sure you discuss any questions you have with your health care provider.   Document Released: 02/10/2002 Document Revised: 01/05/2015 Document Reviewed: 05/12/2011 Elsevier Interactive Patient Education [employer].

## 2016-08-26 NOTE — Progress Notes (Signed)
   GYNECOLOGY ANNUAL PREVENTATIVE CARE ENCOUNTER NOTE  Subjective:    Jessica [name] is a 55 y.o. [id number] female here for a routine annual gynecologic exam.  Current complaints: vaginal discomfort following intercourse; this has been going on for years. Helped a lot with using water-based lubrication. Also complains of right breast pain; had normal mammogram on 04/2016 but felt lesion on the underside of her right breast that is very tender to touch in the last few weeks.    Gynecologic History No LMP recorded. Patient has had an ablation. Last Pap:06/20/14. Results were: Normal pap smear and negative HRHPV. Last mammogram: 05/08/16. Results were: normal Colonoscopy 10/2013: Normal as per patient report  Obstetric History                      OB History  Gravida Para Term Preterm AB SAB TAB Ectopic Multiple Living  3 3 3       3     # Outcome Date GA Lbr Len/2nd Weight Sex Delivery Anes PTL Lv  3 TRM           2 TRM           1 TRM              The following portions of the patient's history were reviewed and updated as appropriate: allergies, current medications, past family history, past medical history, past social history, past surgical history and problem list.  Review of Systems A comprehensive review of systems was positive for hot flashes occasionally.   Objective:  Blood pressure (!) 143/83, pulse 75, height 5\' 1"  (1.549 m), weight 258 lb (117 kg). GENERAL: Well-developed, obese female in no acute distress.  HEENT: Normocephalic, atraumatic. Sclerae anicteric.  NECK: Supple. Normal thyroid.  LUNGS: Clear to auscultation bilaterally.  HEART: Regular rate and rhythm.  BREASTS: Large, symmetric in size. Broad lesion palpated on underside of right breast, ? tissue thickening, tender to touch, immobile.  No other masses, skin changes, nipple drainage, or lymphadenopathy.  ABDOMEN: Soft, obese, nontender, nondistended. No organomegaly  palpated. PELVIC: Normal external female genitalia. Vagina is pink and well-rugated. Thick white discharge noted. No erythema or other lesions. Normal cervix contour. Pap smear obtained. Unable to palpate uterus or adnexa secondary to habitus  EXTREMITIES: No cyanosis, clubbing, or edema, 2+ distal pulses.  Assessment:  Annual gynecologic examination Vaginal pain/dyspareunia Breast pain/lesion   Plan:   Pap and ancillary testing for vaginitis pathogens done, will follow up results and manage accordingly No evidence of vulvovaginal atrophy on exam; recommended continued use of water-based lubrication during intercourse  Breast imaging ordered, will follow up results Routine preventative health maintenance measures emphasized   Verita Schneiders, MD, FACOG Attending [place] [facility] [place], [place]

## 2016-08-26 NOTE — Progress Notes (Signed)
Pt here for routine gyn visit. Last pap 2015. Last MM 04/2016. C/O intermittent lower back cramping, pelvic pain/pressure, and left breast pain.

## 2016-08-27 DIAGNOSIS — G4733 Obstructive sleep apnea (adult) (pediatric): Secondary | ICD-10-CM | POA: Diagnosis not present

## 2016-08-27 DIAGNOSIS — I10 Essential (primary) hypertension: Secondary | ICD-10-CM | POA: Diagnosis not present

## 2016-08-27 LAB — CYTOLOGY - PAP

## 2016-08-28 ENCOUNTER — Encounter: Payer: Self-pay | Admitting: Student

## 2016-08-28 ENCOUNTER — Ambulatory Visit (INDEPENDENT_AMBULATORY_CARE_PROVIDER_SITE_OTHER): Payer: Medicare Other | Admitting: Student

## 2016-08-28 VITALS — BP 136/78 | HR 79 | Temp 97.9°F | Ht 61.0 in | Wt 259.2 lb

## 2016-08-28 DIAGNOSIS — I10 Essential (primary) hypertension: Secondary | ICD-10-CM

## 2016-08-28 DIAGNOSIS — F331 Major depressive disorder, recurrent, moderate: Secondary | ICD-10-CM | POA: Diagnosis not present

## 2016-08-28 DIAGNOSIS — F419 Anxiety disorder, unspecified: Secondary | ICD-10-CM

## 2016-08-28 DIAGNOSIS — N644 Mastodynia: Secondary | ICD-10-CM

## 2016-08-28 DIAGNOSIS — Z0000 Encounter for general adult medical examination without abnormal findings: Secondary | ICD-10-CM

## 2016-08-28 LAB — BASIC METABOLIC PANEL WITH GFR
BUN: 13 mg/dL (ref 7–25)
CHLORIDE: 103 mmol/L (ref 98–110)
CO2: 21 mmol/L (ref 20–31)
Calcium: 9.3 mg/dL (ref 8.6–10.4)
Creat: 0.71 mg/dL (ref 0.50–1.05)
GFR, Est African American: >89 mL/min (ref >=60)
GFR, Est Non African American: >89 mL/min (ref >=60)
GLUCOSE: 83 mg/dL (ref 65–99)
POTASSIUM: 4.2 mmol/L (ref 3.5–5.3)
Sodium: 139 mmol/L (ref 135–146)

## 2016-08-28 MED ORDER — DULOXETINE HCL 60 MG PO CPEP: 60.0000 mg | ORAL_CAPSULE | Freq: Every day | ORAL | 0 refills | Status: DC

## 2016-08-28 NOTE — Patient Instructions (Signed)
It was great seeing you today! We have addressed the following issues today  1. Anxiety: I have increased the Cymbalta to 60 mg daily. Deep breathing and bag breathing can also help as well. Please come back and see [place] in one month.  2. Blood pressure: Your goal blood pressure is less than 140/90. Continue taking the medication as it is 3. Breast lump: I hardly feel anything abnormal. I have very low suspicion for something serious such as malignancy or cancer. Your recent mammogram is also normal. You can continue following up with OB/GYN.     If we did any lab work today, and the results require attention, either me or my nurse will get in touch with you. If everything is normal, you will get a letter in mail. If you don't hear from [place] in two weeks, please give [place] a call. Otherwise, I look forward to talking with you again at our next visit. If you have any questions or concerns before then, please call the clinic at [phone].  Please bring all your medications to every doctors visit   Sign up for My Chart to have easy access to your labs results, and communication with your Primary care physician.    Please check-out at the front desk before leaving the clinic.   Take Care,

## 2016-08-28 NOTE — Assessment & Plan Note (Signed)
Reports not being able to stop wearing. Reports feeling nervous and anxious when she is in a crowded area. Scored 6 on GAD-7 with no difficulty at work, home or getting along with people. Currently getting psychotherapy every 2 weeks for the last 8 weeks which is helping. She is also on Cymbalta 30 mg daily. I have increased her Cymbalta to 60 mg daily today. Patient to follow-up on this in 1 month.
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
## 2016-08-28 NOTE — Assessment & Plan Note (Addendum)
On Cymbalta 30 mg daily. Reports good compliance. PHQ9-4. Denies suicidal ideation. Increased her Cymbalta to 60 mg daily for her anxiety.

## 2016-08-28 NOTE — Assessment & Plan Note (Signed)
Patient has similar complaints in 2015. Her mammogram is negative 3 months ago. Pain is over her bra line and her breast. Exam is unremarkable. She was seen by OB/GYN 2 days ago. She says there is a plan to do diagnostic mammogram in 7 days. Reassured patient. This is likely musculoskeletal pain may be from the bra line.

## 2016-08-28 NOTE — Assessment & Plan Note (Signed)
Up to date on everything except flu shot.

## 2016-08-29 LAB — CERVICOVAGINAL ANCILLARY ONLY
Bacterial vaginitis: NEGATIVE
Candida vaginitis: NEGATIVE

## 2016-09-04 ENCOUNTER — Encounter: Payer: Self-pay | Admitting: Student

## 2016-09-04 ENCOUNTER — Ambulatory Visit
Admission: RE | Admit: 2016-09-04 | Discharge: 2016-09-04 | Disposition: A | Discharge status: Home / Self Care | Payer: Medicare Other | Source: Ambulatory Visit | Attending: Obstetrics & Gynecology | Admitting: Obstetrics & Gynecology

## 2016-09-04 ENCOUNTER — Telehealth: Payer: Self-pay | Admitting: Student

## 2016-09-04 DIAGNOSIS — N6310 Unspecified lump in the right breast, unspecified quadrant: Secondary | ICD-10-CM

## 2016-09-04 DIAGNOSIS — R928 Other abnormal and inconclusive findings on diagnostic imaging of breast: Secondary | ICD-10-CM | POA: Diagnosis not present

## 2016-09-04 DIAGNOSIS — N6489 Other specified disorders of breast: Secondary | ICD-10-CM | POA: Diagnosis not present

## 2016-09-04 NOTE — Telephone Encounter (Signed)
Called patient for follow-up on her recent visit for depression and anxiety. At her last office visit, we increased her Cymbalta to 60 mg daily. Patient states "I feel great". She states that her anxiety is well controlled now. She stated that she has a follow-up appointment with me in about a week to discuss about his CPAP and follow up on her depression and anxiety as well.   Patient lives alone. However, daughter is lives close by and is available for support when needed during her bariatric surgery.  Patient appreciated the follow up call.   I have printed her support letter for bariatric surgery and placed it in outgoing fax box.

## 2016-09-04 NOTE — Progress Notes (Signed)
Sent result letter.

## 2016-09-11 ENCOUNTER — Encounter: Payer: Self-pay | Admitting: Student

## 2016-09-11 ENCOUNTER — Ambulatory Visit (INDEPENDENT_AMBULATORY_CARE_PROVIDER_SITE_OTHER): Payer: Medicare Other | Admitting: Student

## 2016-09-11 VITALS — BP 122/78 | HR 80 | Temp 97.9°F | Ht 61.0 in | Wt 260.2 lb

## 2016-09-11 DIAGNOSIS — F329 Major depressive disorder, single episode, unspecified: Secondary | ICD-10-CM

## 2016-09-11 DIAGNOSIS — F32A Depression, unspecified: Secondary | ICD-10-CM

## 2016-09-11 DIAGNOSIS — G4700 Insomnia, unspecified: Secondary | ICD-10-CM

## 2016-09-11 DIAGNOSIS — M23303 Other meniscus derangements, unspecified medial meniscus, right knee: Secondary | ICD-10-CM | POA: Diagnosis not present

## 2016-09-11 DIAGNOSIS — F419 Anxiety disorder, unspecified: Secondary | ICD-10-CM

## 2016-09-11 DIAGNOSIS — Z23 Encounter for immunization: Secondary | ICD-10-CM

## 2016-09-11 MED ORDER — DICLOFENAC SODIUM 1 % TD GEL: 2.0000 g | Freq: Two times a day (BID) | TRANSDERMAL | 0 refills | Status: DC

## 2016-09-11 MED ORDER — NAPROXEN 500 MG PO TABS: 500.0000 mg | ORAL_TABLET | Freq: Two times a day (BID) | ORAL | 0 refills | Status: DC

## 2016-09-11 NOTE — Patient Instructions (Signed)
It was great seeing you again! I'm glad you felt better.   1. Sleep problem: This refer to the sleep hygiene handout I gave you. If no change, please let me know 2. CPAP mask: I will look into this and let you know in the next 2-3 days 3. Anxiety/depression: Continue the Cymbalta 4.   Pain: I have ordered a prescription for Voltaren gel and naproxen. Please take the naproxen only when you need as it can affect your kidney and raise your blood pressure    If we did any lab work today, and the results require attention, either me or my nurse will get in touch with you. If everything is normal, you will get a letter in mail. If you don't hear from [place] in two weeks, please give [place] a call. Otherwise, I look forward to talking with you again at our next visit. If you have any questions or concerns before then, please call the clinic at [phone].  Please bring all your medications to every doctors visit   Sign up for My Chart to have easy access to your labs results, and communication with your Primary care physician.    Please check-out at the front desk before leaving the clinic.   Take Care,

## 2016-09-11 NOTE — Progress Notes (Signed)
   Subjective:    Patient ID: Jessica [name] is a 55 y.o. old female.  HPI #Sleep issue: this has been going on for weeks. Reports problem falling asleep and staying asleep. However, she reports sleeping from 10:30 to 6:30 almost everyday and waking up refreshed most of the time. She denies electronic use in the bedroom. She has TV in bed but doesn't watch in bed. She has a sleep apnea and wears CPAP everyday. She reports reportes that her CPAP mask is not fitting well and is uncomfortable. She wanted know if we can order a new CPAP mask. She drinks one cup of coffee in the morning but not in the afternoon or evening. Denies soda or alcohol. She denies daytime napping.   #Depression/anxiety: Reports marked improvement in his symptoms since we increase Cymbalta to 60 mg. She is tolerating the new dose without symptoms.   PMH: reviewed SH: Denies smoking, drinking or drug use  Review of Systems Per HPI Objective:   Vitals:   09/11/16 0927  BP: 122/78  Pulse: 80  Temp: 97.9 F (36.6 C)  TempSrc: Oral  SpO2: 95%  Weight: 260 lb 3.2 oz (118 kg)  Height: 5\' 1"  (1.549 m)   GEN: appears well, no apparent distress. RESP: no increased work of breathing NEURO: alert and oriented appropriately, no gross defecits  PSYCH:Neatly dressed. Maintains good eye contact and is cooperative and attentive. Speech is normal volume and rate. Mood is good with appropriate affect.  Thought process is logical and goal directed. No suicidal or homicidal ideation. Does not appear to be responding to any internal stimuli. Able to maintain train of thought and concentrate on the questions.     Assessment & Plan:  Insomnia Unclear about the magnitude of this problem at this time as patient reports sleeping an average of 8 hours a night and waking up refreshed most of the time. I will look into his CPAP mask and try to order it. Gave to sleep hygiene handout. If no improvement, I may try hydroxyzine at bedtime.    Anxiety Patient reports marked improvement in his symptoms. GAD 7 down to 3 today. We will continue Cymbalta. Patient is tolerating the medication well.  Depression Patient reports marked improvement. She reports problem with sleep. Score 5 on pH Q9 today. We will address the sleep issue as above. We will continue Cymbalta. Patient reports tolerating the medication well. We will see her back in 6 months

## 2016-09-12 ENCOUNTER — Telehealth: Payer: Self-pay | Admitting: Student

## 2016-09-12 NOTE — Telephone Encounter (Signed)
Called and advised patient to call a company she got the CPAP from to send me a request form for her CPAP mask. Patient agreed to do as advised

## 2016-09-12 NOTE — Assessment & Plan Note (Signed)
Unclear about the magnitude of this problem at this time as patient reports sleeping an average of 8 hours a night and waking up refreshed most of the time. I will look into his CPAP mask and try to order it. Gave to sleep hygiene handout. If no improvement, I may try hydroxyzine at bedtime.

## 2016-09-12 NOTE — Assessment & Plan Note (Signed)
Patient reports marked improvement in his symptoms. GAD 7 down to 3 today. We will continue Cymbalta. Patient is tolerating the medication well.

## 2016-09-12 NOTE — Assessment & Plan Note (Signed)
Patient reports marked improvement. She reports problem with sleep. Score 5 on pH Q9 today. We will address the sleep issue as above. We will continue Cymbalta. Patient reports tolerating the medication well. We will see her back in 6 months

## 2016-09-16 ENCOUNTER — Encounter (HOSPITAL_BASED_OUTPATIENT_CLINIC_OR_DEPARTMENT_OTHER): Payer: Self-pay | Admitting: Emergency Medicine

## 2016-09-16 ENCOUNTER — Other Ambulatory Visit: Payer: Self-pay

## 2016-09-16 ENCOUNTER — Emergency Department (HOSPITAL_BASED_OUTPATIENT_CLINIC_OR_DEPARTMENT_OTHER)
Admission: EM | Admit: 2016-09-16 | Discharge: 2016-09-16 | Disposition: A | Discharge status: Home / Self Care | Payer: Medicare Other | Attending: Emergency Medicine | Admitting: Emergency Medicine

## 2016-09-16 DIAGNOSIS — Z79899 Other long term (current) drug therapy: Secondary | ICD-10-CM | POA: Diagnosis not present

## 2016-09-16 DIAGNOSIS — R1011 Right upper quadrant pain: Secondary | ICD-10-CM

## 2016-09-16 DIAGNOSIS — Z7951 Long term (current) use of inhaled steroids: Secondary | ICD-10-CM | POA: Insufficient documentation

## 2016-09-16 DIAGNOSIS — Z791 Long term (current) use of non-steroidal anti-inflammatories (NSAID): Secondary | ICD-10-CM | POA: Diagnosis not present

## 2016-09-16 DIAGNOSIS — R1013 Epigastric pain: Secondary | ICD-10-CM | POA: Diagnosis not present

## 2016-09-16 DIAGNOSIS — I10 Essential (primary) hypertension: Secondary | ICD-10-CM | POA: Diagnosis not present

## 2016-09-16 LAB — COMPREHENSIVE METABOLIC PANEL
ALBUMIN: 3.4 g/dL — AB (ref 3.5–5.0)
ALK PHOS: 86 U/L (ref 38–126)
ALT: 39 U/L (ref 14–54)
ANION GAP: 6 (ref 5–15)
AST: 46 U/L — ABNORMAL HIGH (ref 15–41)
BILIRUBIN TOTAL: 0.3 mg/dL (ref 0.3–1.2)
BUN: 14 mg/dL (ref 6–20)
CALCIUM: 9.2 mg/dL (ref 8.9–10.3)
CO2: 24 mmol/L (ref 22–32)
Chloride: 106 mmol/L (ref 101–111)
Creatinine, Ser: 0.62 mg/dL (ref 0.44–1.00)
GLUCOSE: 90 mg/dL (ref 65–99)
Potassium: 3.2 mmol/L — ABNORMAL LOW (ref 3.5–5.1)
Sodium: 136 mmol/L (ref 135–145)
TOTAL PROTEIN: 7.5 g/dL (ref 6.5–8.1)

## 2016-09-16 LAB — CBC WITH DIFFERENTIAL/PLATELET
Basophils Absolute: 0 10*3/uL (ref 0.0–0.1)
Basophils Relative: 0 %
Eosinophils Absolute: 0.1 10*3/uL (ref 0.0–0.7)
Eosinophils Relative: 1 %
HEMATOCRIT: 37.8 % (ref 36.0–46.0)
HEMOGLOBIN: 12.8 g/dL (ref 12.0–15.0)
LYMPHS ABS: 2.8 10*3/uL (ref 0.7–4.0)
LYMPHS PCT: 29 %
MCH: 30.5 pg (ref 26.0–34.0)
MCHC: 33.9 g/dL (ref 30.0–36.0)
MCV: 90.2 fL (ref 78.0–100.0)
MONO ABS: 0.7 10*3/uL (ref 0.1–1.0)
MONOS PCT: 7 %
NEUTROS ABS: 6 10*3/uL (ref 1.7–7.7)
NEUTROS PCT: 63 %
Platelets: 345 10*3/uL (ref 150–400)
RBC: 4.19 MIL/uL (ref 3.87–5.11)
RDW: 14 % (ref 11.5–15.5)
WBC: 9.6 10*3/uL (ref 4.0–10.5)

## 2016-09-16 LAB — LIPASE, BLOOD: LIPASE: 20 U/L (ref 11–51)

## 2016-09-16 MED ORDER — FAMOTIDINE IN NACL 20-0.9 MG/50ML-% IV SOLN
20.0000 mg | Freq: Once | INTRAVENOUS | Status: AC
Start: 2016-09-16 — End: 2016-09-16
  Administered 2016-09-16: 20 mg via INTRAVENOUS
  Filled 2016-09-16: qty 50

## 2016-09-16 MED ORDER — GI COCKTAIL ~~LOC~~
30.0000 mL | Freq: Once | ORAL | Status: AC
  Administered 2016-09-16: 30 mL via ORAL
  Filled 2016-09-16: qty 30

## 2016-09-16 MED ORDER — PANTOPRAZOLE SODIUM 20 MG PO TBEC: 20.0000 mg | DELAYED_RELEASE_TABLET | Freq: Every day | ORAL | 0 refills | Status: DC

## 2016-09-16 MED FILL — PANTOPRAZOLE SOD DR 20 MG T: 20 | 30 days supply | Qty: 30 | Fill #0

## 2016-09-16 NOTE — ED Triage Notes (Signed)
Pt having ruq abdominal pain since yesterday. similair episode one week ago.  Pt havng bloating and feels constipated.  No fever.  Some sweating.

## 2016-09-16 NOTE — ED Provider Notes (Signed)
[facility] Provider Note   CSN: [id number] Arrival date & time: 09/16/16  0950     History   Chief Complaint Chief Complaint  Patient presents with  . Abdominal Pain    HPI Jessica [name] is a 55 y.o. female.  Patient presents today with a chief complaint of RUQ abdominal pain.  Pain has been present since 1 PM yesterday afternoon.  She states that the pain felt like "contractions."  She states that she drank hot tea and soaked in Epson salts and the pain eased up somewhat.  She reports that she had associated nausea and one episode of vomiting.  She had similar pain 2 weeks ago.  She does report that she ate a piece of pepperoni pizza prior to the onset of the pain, which she thinks may have brought on the pain.  She denies any alcohol use.  Past surgical history significant for Cholecystectomy 26 years ago.      Past Medical History:  Diagnosis Date  . Anal fissure    history  . Angioedema of lips 03/21/2011   Presumed 2nd to ACE   . Arthritis    feet  . BV (bacterial vaginosis)    recent dx - finish med 12/22/11  . Depression     on meds  . Headache(784.0)   . HSV (herpes simplex virus) infection   . Hypertension   . Insomnia    on meds  . Migraines   . Obesity   . Sleep apnea    occas. uses CPAP    Patient Active Problem List   Diagnosis Date Noted  . Routine health maintenance 08/28/2016  . Anxiety 08/28/2016  . Pain in joint of right shoulder 04/25/2016  . Right hand pain 04/16/2016  . Right cervical radiculopathy 03/12/2016  . Primary osteoarthritis of right knee 02/19/2015  . Essential hypertension 02/19/2015  . Left-sided low back pain without sciatica 01/04/2015  . Polyuria 01/03/2015  . Left knee pain 10/31/2014  . Back pain 09/20/2014  . Pain of right breast 06/26/2014  . Melasma 05/24/2014  . Insomnia 01/19/2014  . Hx Herpes simplex virus type 1 (HSV-1) dermatitis 07/22/2013  . Trigeminal neuralgia 01/18/2013  . Skin  hypopigmentation 09/16/2012  . Blurry vision, bilateral 07/31/2012  . Leg pain 03/10/2012  . Menorrhagia 09/16/2011  . Allergic rhinitis 04/10/2011  . 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 Derangement of medial meniscus of right knee 02/06/2009  . HTN, goal below 140/90 09/27/2008  . VENOUS INSUFFICIENCY, LEGS 07/07/2008  . SLEEP APNEA, OBSTRUCTIVE 03/24/2008  . 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 ARTHRITIS, ACROMIOCLAVICULAR 11/24/2007  . ROTATOR CUFF SPRAIN AND STRAIN 11/24/2007  . MIGRAINE NOS W/O INTRACTABLE MIGRAINE 08/23/2007  . Morbid obesity ([place]) 06/04/2007  . Depression 06/04/2007    Past Surgical History:  Procedure Laterality Date  . CESAREAN SECTION     x 3  . CHOLECYSTECTOMY    . ENDOMETRIAL ABLATION    . KNEE SURGERY  oct 2013   minesctomy and debridement  . NECK SURGERY  2001   cervical fusion C4/5  . TUBAL LIGATION  1992    OB History    Gravida Para Term Preterm AB Living   3 3 3  0 0 3   SAB TAB Ectopic Multiple Live Births   0 0 0 0 3       Home Medications    Prior to Admission medications   Medication Sig Start Date End Date Taking?  Authorizing Provider  amLODipine (NORVASC) 10 MG tablet Take 1 tablet (10 mg total) by mouth daily. 11/07/15  Yes Mariel Aloe, MD  cetirizine (ZYRTEC) 10 MG tablet TAKE 1 TABLET (10 MG) BY MOUTH DAILY AS NEEDED 07/29/16  Yes Mercy Riding, MD  DULoxetine (CYMBALTA) 60 MG capsule Take 1 capsule (60 mg total) by mouth daily. 08/28/16  Yes Mercy Riding, MD  fluticasone (FLONASE) 50 MCG/ACT nasal spray PLACE 2 SPRAYS INTO BOTH NOSTRILS DAILY. 03/25/16  Yes Mariel Aloe, MD  montelukast (SINGULAIR) 10 MG tablet Take 1 tablet (10 mg total) by mouth at bedtime. 03/25/16  Yes Mariel Aloe, MD  naproxen (NAPROSYN) 500 MG tablet Take 1 tablet (500 mg total) by mouth 2 (two) times daily with a meal. 09/11/16  Yes Mercy Riding, MD  polyethylene glycol powder (GLYCOLAX/MIRALAX) powder Take 17 g by mouth  daily as needed for moderate constipation or severe constipation. 12/27/15  Yes Mariel Aloe, MD  tiZANidine (ZANAFLEX) 2 MG tablet Take 1 tablet (2 mg total) by mouth at bedtime. 03/13/16  Yes Leone Brand, MD  topiramate (TOPAMAX) 50 MG tablet Take 1 tablet (50 mg total) by mouth 2 (two) times daily. 08/08/16  Yes Mercy Riding, MD  triamterene-hydrochlorothiazide (DYAZIDE) 37.5-25 MG capsule Take 1 each (1 capsule total) by mouth daily. 08/08/16  Yes Mercy Riding, MD  diclofenac sodium (VOLTAREN) 1 % GEL Apply 2 g topically 2 (two) times daily. 09/11/16   Mercy Riding, MD  phentermine 37.5 MG capsule Take 37.5 mg by mouth every morning.    Neldon Labella, NP  VENTOLIN HFA 108 (90 Base) MCG/ACT inhaler INHALE 2 PUFFS INTO THE LUNGS EVERY 6 (SIX) HOURS AS NEEDED FOR WHEEZING OR SHORTNESS OF BREATH. 05/13/16   Mariel Aloe, MD    Family History Family History  Problem Relation Age of Onset  . Diabetes Mother   . Heart failure Mother   . Hypertension Mother   . Stroke Mother   . Diabetes Father   . Diabetes Sister   . Cancer Paternal Aunt     cevical  . Cancer Paternal Aunt     cervical  . Heart attack Brother 41    Social History Social History  Substance Use Topics  . Smoking status: Never Smoker  . Smokeless tobacco: Never Used  . Alcohol use No     Comment: socially     Allergies   Ace inhibitors; Angiotensin receptor blockers; and Latex   Review of Systems Review of Systems  All other systems reviewed and are negative.    Physical Exam Updated Vital Signs BP 134/67   Pulse 80   Temp 98.9 F (37.2 C)   Resp 16   Ht 5\' 1"  (1.549 m)   Wt 117.9 kg   SpO2 99%   BMI 49.13 kg/m   Physical Exam  Constitutional: She appears well-developed and well-nourished.  HENT:  Head: Normocephalic and atraumatic.  Neck: Normal range of motion. Neck supple.  Cardiovascular: Normal rate, regular rhythm and normal heart sounds.   Pulmonary/Chest: Effort normal and  breath sounds normal.  Abdominal: Soft. Bowel sounds are normal. She exhibits no distension and no mass. There is tenderness in the right lower quadrant and epigastric area. There is no rebound and no guarding. No hernia.  Musculoskeletal: Normal range of motion.  Neurological: She is alert.  Skin: Skin is warm and dry.  Psychiatric: She has a normal mood and affect.  Nursing note and vitals reviewed.    ED Treatments / Results  Labs (all labs ordered are listed, but only abnormal results are displayed) Labs Reviewed - No data to display  EKG  EKG Interpretation  Date/Time:  Tuesday September 16 2016 10:39:28 EDT Ventricular Rate:  72 PR Interval:    QRS Duration: 98 QT Interval:  393 QTC Calculation: 431 R Axis:   53 Text Interpretation:  Sinus rhythm Low voltage, precordial leads Borderline T abnormalities, anterior leads Confirmed by Alvino Chapel  MD, Ovid Curd [phone]) on 09/17/2016 4:26:09 PM       Radiology No results found.  Procedures Procedures (including critical care time)  Medications Ordered in ED Medications - No data to display   Initial Impression / Assessment and Plan / ED Course  I have reviewed the triage vital signs and the nursing notes.  Pertinent labs & imaging results that were available during my care of the patient were reviewed by me and considered in my medical decision making (see chart for details).  Clinical Course   11:59 AM  Reassessed patient.  She reports improvement of her pain at this time.    Final Clinical Impressions(s) / ED Diagnoses   Final diagnoses:  None   Patient presents today with RUQ abdominal pain onset this morning.  Labs are unremarkable.  No ischemic changes on EKG.  She reports that she had a Cholecystectomy 26 years ago.  Pain improved after GI cocktail.  Patient tolerating PO liquids and had improvement of pain prior to discharge.  Stable for discharge.  Return precautions given  New Prescriptions New  Prescriptions   No medications on file     Hyman Bible, Hershal Coria 09/18/16 2251    Julianne Rice, MD 09/23/16 [phone]

## 2016-09-30 ENCOUNTER — Telehealth: Payer: Self-pay | Admitting: Student

## 2016-09-30 NOTE — Telephone Encounter (Signed)
Signed an authorization form for CPAP gear, mask, pap nasal and placed a form in outgoing fax box

## 2016-10-04 ENCOUNTER — Other Ambulatory Visit: Payer: Self-pay | Admitting: Student

## 2016-10-06 NOTE — Telephone Encounter (Signed)
Pt informed Rx has been sent to pharmacy. Ottis Stain, CMA

## 2016-10-15 DIAGNOSIS — I10 Essential (primary) hypertension: Secondary | ICD-10-CM | POA: Diagnosis not present

## 2016-10-15 DIAGNOSIS — G4733 Obstructive sleep apnea (adult) (pediatric): Secondary | ICD-10-CM | POA: Diagnosis not present

## 2016-10-16 ENCOUNTER — Telehealth: Payer: Self-pay | Admitting: Student

## 2016-10-16 NOTE — Telephone Encounter (Signed)
Talked to patient's in response to an order form for Pap headgear and  CPAP mask from [facility]. I have signed this same for on 09/30/2016 and faxed to [facility]. Patient states that she has received the mask but not the supplies. She states that the person who brought the mask to her told her he would order the supplies. She also states getting her supplies from a different company. I advised patient to contact the company that sends the supplies to her if they have to send me the form. Patient voiced understanding and agreed to to do as advised.

## 2016-10-24 ENCOUNTER — Other Ambulatory Visit: Payer: Self-pay | Admitting: *Deleted

## 2016-10-25 MED ORDER — AMLODIPINE BESYLATE 10 MG PO TABS: 10.0000 mg | ORAL_TABLET | Freq: Every day | ORAL | 3 refills | Status: DC

## 2016-10-31 ENCOUNTER — Telehealth: Payer: Self-pay | Admitting: *Deleted

## 2016-10-31 DIAGNOSIS — N760 Acute vaginitis: Secondary | ICD-10-CM

## 2016-10-31 DIAGNOSIS — H539 Unspecified visual disturbance: Secondary | ICD-10-CM | POA: Diagnosis not present

## 2016-10-31 MED ORDER — FLUCONAZOLE 150 MG PO TABS: 150.0000 mg | ORAL_TABLET | Freq: Once | ORAL | 0 refills | Status: AC

## 2016-10-31 MED ORDER — METRONIDAZOLE 500 MG PO TABS: 500.0000 mg | ORAL_TABLET | Freq: Two times a day (BID) | ORAL | 0 refills | Status: DC

## 2016-10-31 NOTE — Telephone Encounter (Signed)
Spoke to pt, is experiencing a thin white discharge with irritation.  Pt has a history of bacterial vaginosis, sent Flagyl and Diflucan to the pharmacy.  Instructed to take the Diflucan half way through the treatment of Flagyl. Pt acknowledged instructions.

## 2016-10-31 NOTE — Telephone Encounter (Signed)
-----   Message from Francia Greaves sent at 10/31/2016 10:25 AM EDT ----- Regarding: Rx Request Contact: [phone] Thinks she has a yeast infection Uses [facility] in [facility]

## 2016-11-03 ENCOUNTER — Telehealth: Payer: Self-pay | Admitting: Student

## 2016-11-03 DIAGNOSIS — Z7189 Other specified counseling: Secondary | ICD-10-CM | POA: Diagnosis not present

## 2016-11-03 IMAGING — DX DG CHEST 2V
2 series · 2 of 2 positions shown · non-contrast
Comparison: 05/16/2014, 06/16/2013

CLINICAL DATA: Headache and sore throat for 2 weeks.  Nonsmoker.

EXAM:
CHEST  2 VIEW

[chest pa]
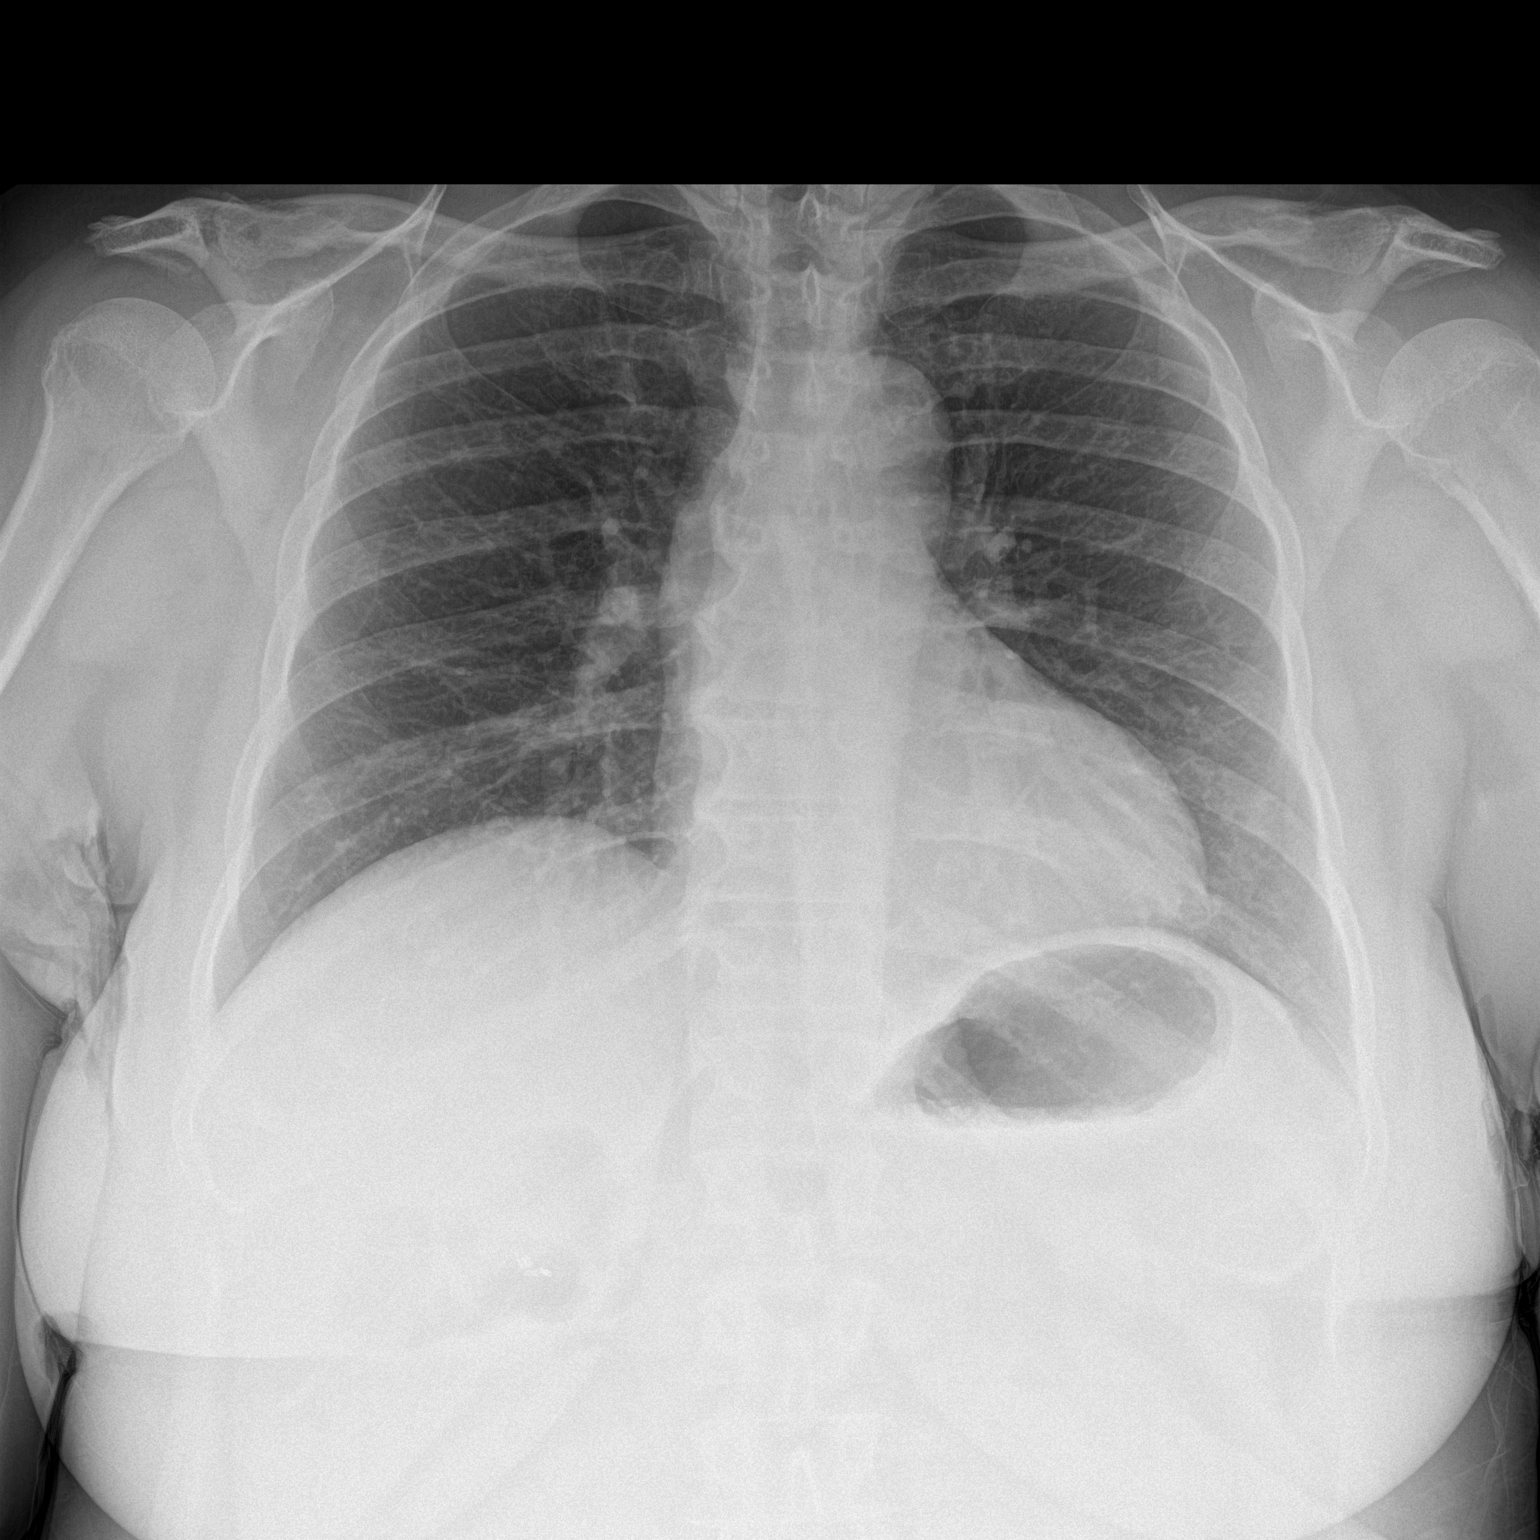

[chest lat]
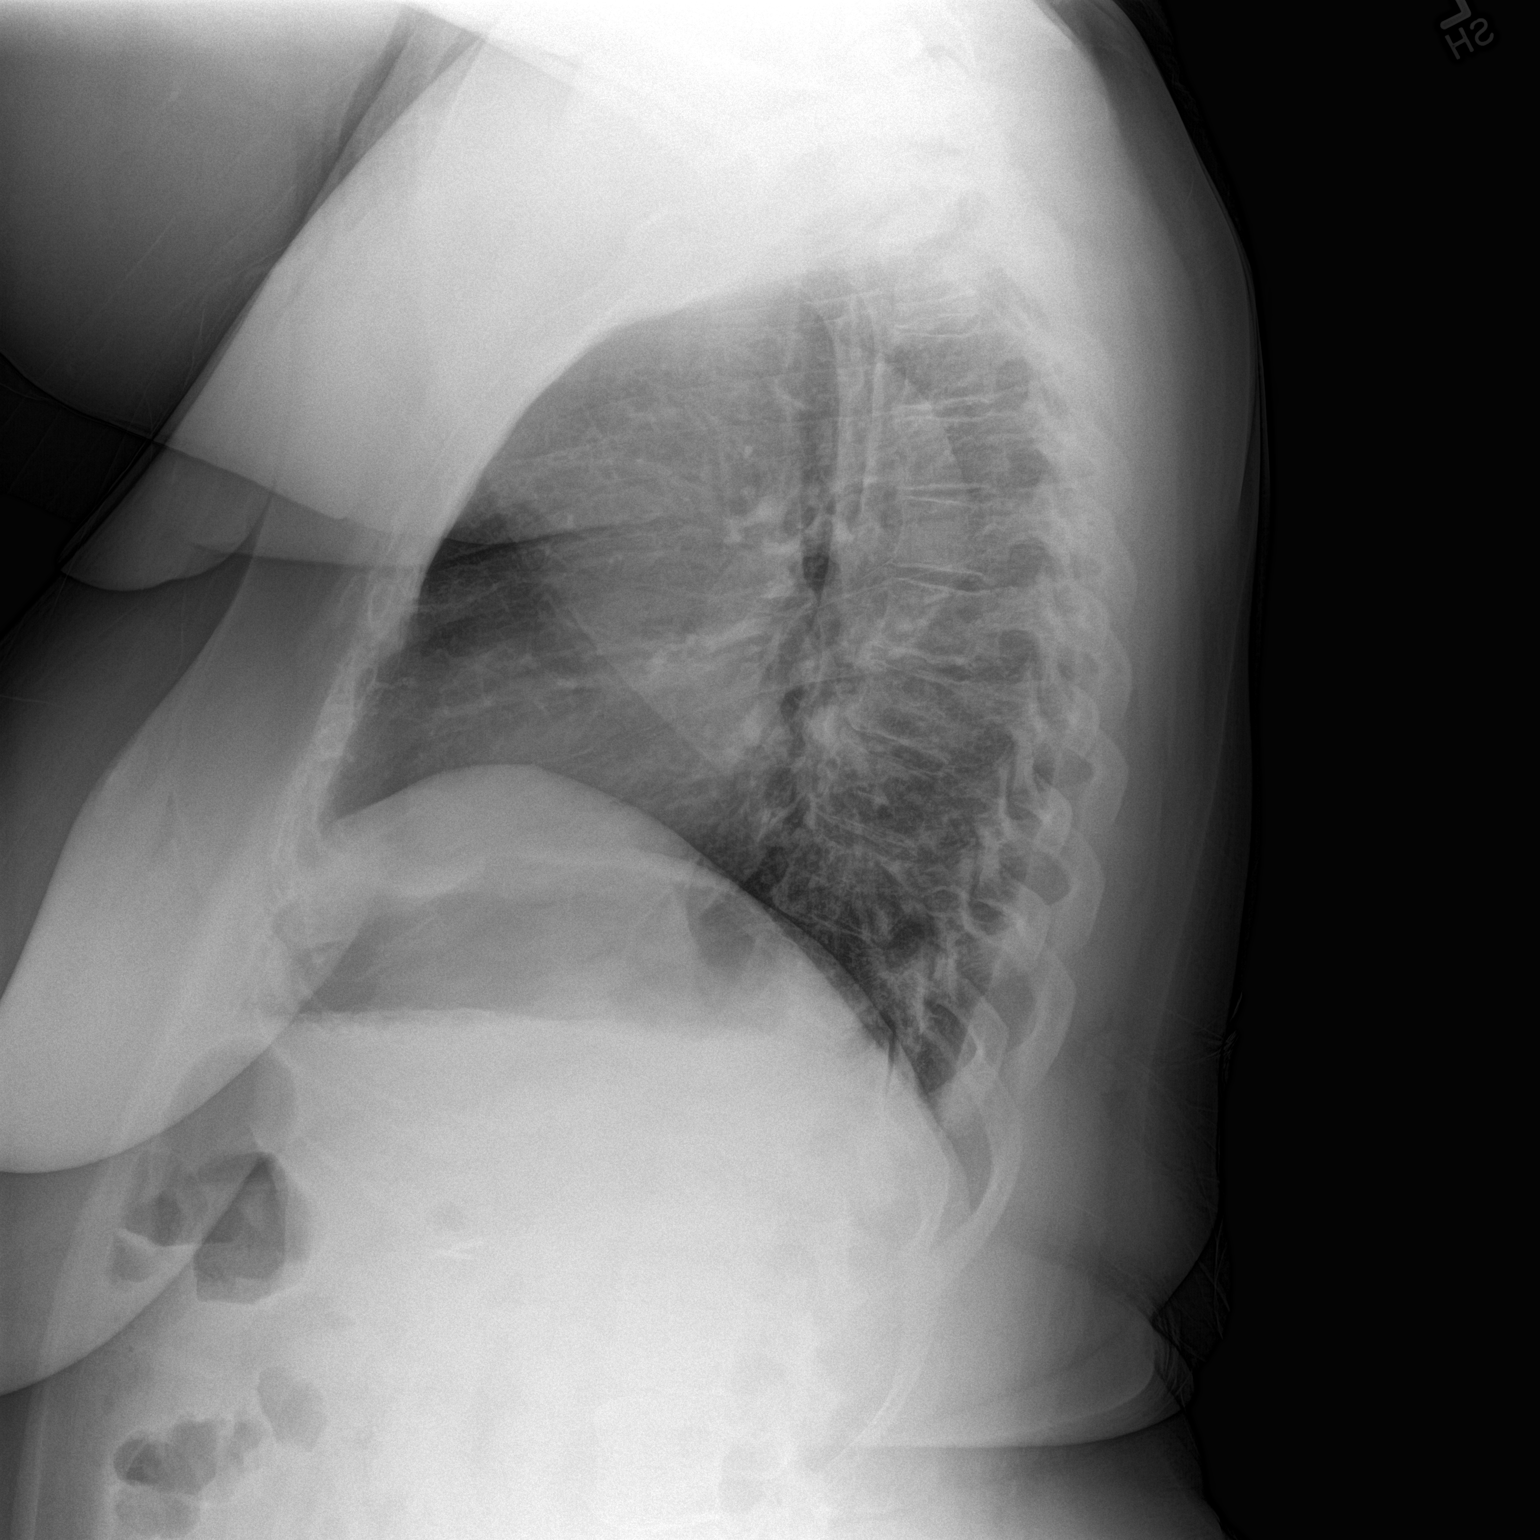

[2 of 2 positions shown; findings below may reference images not displayed]

FINDINGS: Heart size is normal. The lungs are clear. Nodule previously
question in the right upper lobe is less apparent. There is no
evidence for pulmonary edema. Surgical clips are noted in the right
upper quadrant of the abdomen. Moderate mid thoracic spondylosis.
IMPRESSION: No evidence for acute  abnormality.

## 2016-11-03 NOTE — Telephone Encounter (Signed)
Request form for CPAP accessories signed by Dr. Nori Riis and placed on outgoing fax box.

## 2016-11-17 ENCOUNTER — Encounter (HOSPITAL_COMMUNITY): Payer: Self-pay | Admitting: Emergency Medicine

## 2016-11-17 ENCOUNTER — Ambulatory Visit: Payer: Medicare Other | Admitting: Obstetrics & Gynecology

## 2016-11-17 ENCOUNTER — Ambulatory Visit (HOSPITAL_COMMUNITY)
Admission: EM | Admit: 2016-11-17 | Discharge: 2016-11-17 | Disposition: A | Discharge status: Home / Self Care | Payer: Medicare Other | Attending: Emergency Medicine | Admitting: Emergency Medicine

## 2016-11-17 DIAGNOSIS — M7989 Other specified soft tissue disorders: Secondary | ICD-10-CM

## 2016-11-17 DIAGNOSIS — R079 Chest pain, unspecified: Secondary | ICD-10-CM | POA: Diagnosis not present

## 2016-11-17 DIAGNOSIS — G8929 Other chronic pain: Secondary | ICD-10-CM

## 2016-11-17 DIAGNOSIS — M25511 Pain in right shoulder: Secondary | ICD-10-CM

## 2016-11-17 MED ORDER — MELOXICAM 7.5 MG PO TABS: 7.5000 mg | ORAL_TABLET | Freq: Every day | ORAL | 0 refills | Status: DC

## 2016-11-17 MED ORDER — OMEPRAZOLE 20 MG PO CPDR: 20.0000 mg | DELAYED_RELEASE_CAPSULE | Freq: Two times a day (BID) | ORAL | 1 refills | Status: DC

## 2016-11-17 MED ORDER — CYCLOBENZAPRINE HCL 10 MG PO TABS: 10.0000 mg | ORAL_TABLET | Freq: Two times a day (BID) | ORAL | 0 refills | Status: DC | PRN

## 2016-11-17 NOTE — ED Triage Notes (Addendum)
C/o chest pain, history of the same.  No diagnosis per patient, instructed to take aspirin.  Chest pain started yesterday.  Touches right chest as location of pain, non-radiating.  Reports this as a hurting pressure pain, "just hurts".  Pain into right arm and hand described as sore and numbness.  No nausea, no vomiting.  Has had chills.  Patient reports pain is unchanged by activity.  Pain is intermittent, no pain right now.  Has had pain earlier today.  Denies cold symptoms.  Patient reports having chronic shoulder pain related to a work related injury.

## 2016-11-17 NOTE — ED Provider Notes (Signed)
CSN: [id number]     Arrival date & time 11/17/16  1112 History   None    Chief Complaint  Patient presents with  . Chest Pain   (Consider location/radiation/quality/duration/timing/severity/associated sxs/prior Treatment) HPI Jessica [name] is a 55 y.o. female presenting to UC with c/o Right sided chest pain that has been intermittent since yesterday.  Pain is 7/10, "hurting pressure pain."  She is also having Right arm pain that is radiating from her Right shoulder but she notes she has chronic Right shoulder pain from a work-related injury.  She use to take flexeril for her shoulder but has not taken "for a while."  She notes she has been seen by a cardiologist in the past for similar chest pain and had a normal stress test and heart cath about 1 year ago.  Pt also mentions she has noticed increased swelling in her legs over the last 2-3 days.  She has been on fluid pills in the past but none recently. Denies CP right now. Denies shortness of breath.  Pt did have heart burn the other day but is unsure that is what is causing the chest pain.  She is not on any antiacid medications.    Past Medical History:  Diagnosis Date  . Anal fissure    history  . Angioedema of lips 03/21/2011   Presumed 2nd to ACE   . Arthritis    feet  . BV (bacterial vaginosis)    recent dx - finish med 12/22/11  . Depression     on meds  . Headache(784.0)   . HSV (herpes simplex virus) infection   . Hypertension   . Insomnia    on meds  . Migraines   . Obesity   . Sleep apnea    occas. uses CPAP   Past Surgical History:  Procedure Laterality Date  . CESAREAN SECTION     x 3  . CHOLECYSTECTOMY    . ENDOMETRIAL ABLATION    . KNEE SURGERY  oct 2013   minesctomy and debridement  . NECK SURGERY  2001   cervical fusion C4/5  . TUBAL LIGATION  1992   Family History  Problem Relation Age of Onset  . Diabetes Mother   . Heart failure Mother   . Hypertension Mother   . Stroke Mother   . Diabetes  Father   . Diabetes Sister   . Cancer Paternal Aunt     cevical  . Cancer Paternal Aunt     cervical  . Heart attack Brother 44   Social History  Substance Use Topics  . Smoking status: Never Smoker  . Smokeless tobacco: Never Used  . Alcohol use No     Comment: socially   OB History    Gravida Para Term Preterm AB Living   3 3 3  0 0 3   SAB TAB Ectopic Multiple Live Births   0 0 0 0 3     Review of Systems  Constitutional: Negative for chills, diaphoresis, fatigue and fever.  HENT: Negative for congestion, trouble swallowing and voice change.   Respiratory: Negative for cough and shortness of breath.   Cardiovascular: Positive for chest pain. Negative for palpitations.  Gastrointestinal: Negative for abdominal pain, diarrhea, nausea and vomiting.  Musculoskeletal: Positive for arthralgias and myalgias. Negative for back pain.       Right shoulder  Skin: Negative for rash.  Neurological: Negative for weakness and numbness.  All other systems reviewed and are negative.  Allergies  Ace inhibitors; Angiotensin receptor blockers; and Latex  Home Medications   Prior to Admission medications   Medication Sig Start Date End Date Taking? Authorizing Provider  amLODipine (NORVASC) 10 MG tablet Take 1 tablet (10 mg total) by mouth daily. 10/25/16  Yes Mercy Riding, MD  diclofenac sodium (VOLTAREN) 1 % GEL Apply 2 g topically 2 (two) times daily. 09/11/16  Yes Mercy Riding, MD  phentermine 37.5 MG capsule Take 37.5 mg by mouth every morning.   Yes Neldon Labella, NP  tiZANidine (ZANAFLEX) 2 MG tablet Take 1 tablet (2 mg total) by mouth at bedtime. 03/13/16  Yes Leone Brand, MD  topiramate (TOPAMAX) 50 MG tablet Take 1 tablet (50 mg total) by mouth 2 (two) times daily. 08/08/16  Yes Mercy Riding, MD  triamterene-hydrochlorothiazide (DYAZIDE) 37.5-25 MG capsule Take 1 each (1 capsule total) by mouth daily. 08/08/16  Yes Mercy Riding, MD  VITAMIN D, CHOLECALCIFEROL, PO Take by  mouth.   Yes Historical Provider, MD  cetirizine (ZYRTEC) 10 MG tablet TAKE 1 TABLET (10 MG) BY MOUTH DAILY AS NEEDED 10/06/16   Mercy Riding, MD  cyclobenzaprine (FLEXERIL) 10 MG tablet Take 1 tablet (10 mg total) by mouth 2 (two) times daily as needed for muscle spasms. 11/17/16   Noland Fordyce, PA-C  DULoxetine (CYMBALTA) 60 MG capsule Take 1 capsule (60 mg total) by mouth daily. 08/28/16   Mercy Riding, MD  fluticasone (FLONASE) 50 MCG/ACT nasal spray PLACE 2 SPRAYS INTO BOTH NOSTRILS DAILY. 03/25/16   Mariel Aloe, MD  meloxicam (MOBIC) 7.5 MG tablet Take 1 tablet (7.5 mg total) by mouth daily. 11/17/16   Noland Fordyce, PA-C  metroNIDAZOLE (FLAGYL) 500 MG tablet Take 1 tablet (500 mg total) by mouth 2 (two) times daily. Patient not taking: Reported on 11/17/2016 10/31/16   Emily Filbert, MD  montelukast (SINGULAIR) 10 MG tablet Take 1 tablet (10 mg total) by mouth at bedtime. 03/25/16   Mariel Aloe, MD  naproxen (NAPROSYN) 500 MG tablet Take 1 tablet (500 mg total) by mouth 2 (two) times daily with a meal. 09/11/16   Mercy Riding, MD  omeprazole (PRILOSEC) 20 MG capsule Take 1 capsule (20 mg total) by mouth 2 (two) times daily before a meal. 11/17/16   Noland Fordyce, PA-C  pantoprazole (PROTONIX) 20 MG tablet Take 1 tablet (20 mg total) by mouth daily. 09/16/16   Heather Laisure, PA-C  polyethylene glycol powder (GLYCOLAX/MIRALAX) powder Take 17 g by mouth daily as needed for moderate constipation or severe constipation. 12/27/15   Mariel Aloe, MD  VENTOLIN HFA 108 (90 Base) MCG/ACT inhaler INHALE 2 PUFFS INTO THE LUNGS EVERY 6 (SIX) HOURS AS NEEDED FOR WHEEZING OR SHORTNESS OF BREATH. 05/13/16   Mariel Aloe, MD   Meds Ordered and Administered this Visit  Medications - No data to display  BP 133/71 (BP Location: Right Arm) Comment (BP Location): large cuff  Pulse 81   Temp 98.6 F (37 C) (Oral)   Resp 14   SpO2 99%  No data found.   Physical Exam  Constitutional: She is oriented to  person, place, and time. She appears well-developed and well-nourished. No distress.  HENT:  Head: Normocephalic and atraumatic.  Mouth/Throat: Oropharynx is clear and moist.  Eyes: EOM are normal.  Neck: Normal range of motion. Neck supple.  Cardiovascular: Normal rate and regular rhythm.   Pulmonary/Chest: Effort normal and breath sounds normal. No respiratory distress.  She has no wheezes. She has no rales. She exhibits tenderness (Right side).    Abdominal: Soft. She exhibits no distension. There is no tenderness.  Musculoskeletal: Normal range of motion. She exhibits edema (1+ bilateral leg edema. ) and tenderness.  Tenderness to Right shoulder. Full ROM.   Neurological: She is alert and oriented to person, place, and time.  Skin: Skin is warm and dry. Capillary refill takes less than 2 seconds. She is not diaphoretic. No erythema.  Psychiatric: She has a normal mood and affect. Her behavior is normal.  Nursing note and vitals reviewed.   Urgent Care Course   Clinical Course     Procedures (including critical care time)  Labs Review Labs Reviewed - No data to display  Imaging Review No results found.  Date/Time:11/17/16   11:27:00 Ventricular Rate:  73 PR Interval: 170 QRS Duration: 88 QT Interval: 406 QTC Calculation: 447 P-R-T axes: 19  32  6 Text Interpretation: Normal sinus rhythm. Normal ECG.   MDM   1. Right-sided chest pain   2. Chronic right shoulder pain   3. Leg swelling    Pt c/o Right side chest pain. Has been worked up by a cardiologist in the past including normal heart cath and stress test within the last 1 year.  Tenderness to Right upper chest and Right shoulder. Normal EKG. Doubt ACS as cause of pain at this time. Likely musculoskeletal. Possible GERD component.   Rx: Flexeril, meloxicam, and omeprazole. Encouraged f/u with PCP in 1 week if not improving. Discussed symptoms that warrant emergent care in the ED.     Noland Fordyce,  PA-C 11/17/16 1356

## 2016-11-17 NOTE — Discharge Instructions (Signed)
°  Meloxicam (Mobic) is an antiinflammatory to help with pain and inflammation.  Do not take ibuprofen, Advil, Aleve, or any other medications that contain NSAIDs while taking meloxicam as this may cause stomach upset or even ulcers if taken in large amounts for an extended period of time.   Flexeril is a muscle relaxer and may cause drowsiness. Do not drink alcohol, drive, or operate heavy machinery while taking.

## 2016-12-02 ENCOUNTER — Emergency Department (HOSPITAL_BASED_OUTPATIENT_CLINIC_OR_DEPARTMENT_OTHER): Payer: Medicare Other

## 2016-12-02 ENCOUNTER — Emergency Department (HOSPITAL_BASED_OUTPATIENT_CLINIC_OR_DEPARTMENT_OTHER)
Admission: EM | Admit: 2016-12-02 | Discharge: 2016-12-02 | Disposition: A | Discharge status: Home / Self Care | Payer: Medicare Other | Attending: Emergency Medicine | Admitting: Emergency Medicine

## 2016-12-02 ENCOUNTER — Other Ambulatory Visit: Payer: Self-pay | Admitting: Student

## 2016-12-02 ENCOUNTER — Encounter (HOSPITAL_BASED_OUTPATIENT_CLINIC_OR_DEPARTMENT_OTHER): Payer: Self-pay | Admitting: Emergency Medicine

## 2016-12-02 DIAGNOSIS — W182XXA Fall in (into) shower or empty bathtub, initial encounter: Secondary | ICD-10-CM | POA: Diagnosis not present

## 2016-12-02 DIAGNOSIS — Y929 Unspecified place or not applicable: Secondary | ICD-10-CM | POA: Insufficient documentation

## 2016-12-02 DIAGNOSIS — Y939 Activity, unspecified: Secondary | ICD-10-CM | POA: Diagnosis not present

## 2016-12-02 DIAGNOSIS — M25511 Pain in right shoulder: Secondary | ICD-10-CM | POA: Diagnosis not present

## 2016-12-02 DIAGNOSIS — M62838 Other muscle spasm: Secondary | ICD-10-CM | POA: Diagnosis not present

## 2016-12-02 DIAGNOSIS — R079 Chest pain, unspecified: Secondary | ICD-10-CM | POA: Diagnosis not present

## 2016-12-02 DIAGNOSIS — M5030 Other cervical disc degeneration, unspecified cervical region: Secondary | ICD-10-CM | POA: Diagnosis not present

## 2016-12-02 DIAGNOSIS — M19011 Primary osteoarthritis, right shoulder: Secondary | ICD-10-CM | POA: Insufficient documentation

## 2016-12-02 DIAGNOSIS — S4991XA Unspecified injury of right shoulder and upper arm, initial encounter: Secondary | ICD-10-CM | POA: Diagnosis present

## 2016-12-02 DIAGNOSIS — R0602 Shortness of breath: Secondary | ICD-10-CM | POA: Insufficient documentation

## 2016-12-02 DIAGNOSIS — I10 Essential (primary) hypertension: Secondary | ICD-10-CM | POA: Diagnosis not present

## 2016-12-02 DIAGNOSIS — Z79899 Other long term (current) drug therapy: Secondary | ICD-10-CM | POA: Insufficient documentation

## 2016-12-02 DIAGNOSIS — Z91040 Latex allergy status: Secondary | ICD-10-CM | POA: Insufficient documentation

## 2016-12-02 DIAGNOSIS — Y999 Unspecified external cause status: Secondary | ICD-10-CM | POA: Diagnosis not present

## 2016-12-02 DIAGNOSIS — M1990 Unspecified osteoarthritis, unspecified site: Secondary | ICD-10-CM | POA: Diagnosis not present

## 2016-12-02 DIAGNOSIS — M50322 Other cervical disc degeneration at C5-C6 level: Secondary | ICD-10-CM | POA: Diagnosis not present

## 2016-12-02 LAB — CBC WITH DIFFERENTIAL/PLATELET
BASOS ABS: 0 10*3/uL (ref 0.0–0.1)
Basophils Relative: 0 %
EOS ABS: 0.1 10*3/uL (ref 0.0–0.7)
Eosinophils Relative: 1 %
HCT: 39.3 % (ref 36.0–46.0)
HEMOGLOBIN: 13.2 g/dL (ref 12.0–15.0)
Lymphocytes Relative: 29 %
Lymphs Abs: 2.4 10*3/uL (ref 0.7–4.0)
MCH: 30.3 pg (ref 26.0–34.0)
MCHC: 33.6 g/dL (ref 30.0–36.0)
MCV: 90.1 fL (ref 78.0–100.0)
Monocytes Absolute: 0.5 10*3/uL (ref 0.1–1.0)
Monocytes Relative: 6 %
NEUTROS PCT: 64 %
Neutro Abs: 5.3 10*3/uL (ref 1.7–7.7)
Platelets: 320 10*3/uL (ref 150–400)
RBC: 4.36 MIL/uL (ref 3.87–5.11)
RDW: 14.4 % (ref 11.5–15.5)
WBC: 8.3 10*3/uL (ref 4.0–10.5)

## 2016-12-02 LAB — COMPREHENSIVE METABOLIC PANEL
ALK PHOS: 82 U/L (ref 38–126)
ALT: 17 U/L (ref 14–54)
AST: 22 U/L (ref 15–41)
Albumin: 3.5 g/dL (ref 3.5–5.0)
Anion gap: 7 (ref 5–15)
BUN: 19 mg/dL (ref 6–20)
CALCIUM: 9 mg/dL (ref 8.9–10.3)
CO2: 24 mmol/L (ref 22–32)
CREATININE: 0.66 mg/dL (ref 0.44–1.00)
Chloride: 105 mmol/L (ref 101–111)
Glucose, Bld: 88 mg/dL (ref 65–99)
Potassium: 3.5 mmol/L (ref 3.5–5.1)
Sodium: 136 mmol/L (ref 135–145)
TOTAL PROTEIN: 7.8 g/dL (ref 6.5–8.1)
Total Bilirubin: 0.4 mg/dL (ref 0.3–1.2)

## 2016-12-02 LAB — TROPONIN I

## 2016-12-02 LAB — BRAIN NATRIURETIC PEPTIDE: B NATRIURETIC PEPTIDE 5: 15.6 pg/mL (ref 0.0–100.0)

## 2016-12-02 MED ORDER — TIZANIDINE HCL 4 MG PO CAPS: 4.0000 mg | ORAL_CAPSULE | Freq: Three times a day (TID) | ORAL | 0 refills | Status: DC | PRN

## 2016-12-02 MED ORDER — METHOCARBAMOL 500 MG PO TABS: 500.0000 mg | ORAL_TABLET | Freq: Four times a day (QID) | ORAL | 0 refills | Status: DC | PRN

## 2016-12-02 MED ORDER — MONTELUKAST SODIUM 10 MG PO TABS: 10.0000 mg | ORAL_TABLET | Freq: Every day | ORAL | 5 refills | Status: DC

## 2016-12-02 MED ORDER — MELOXICAM 7.5 MG PO TABS: 7.5000 mg | ORAL_TABLET | Freq: Every day | ORAL | 0 refills | Status: DC | PRN

## 2016-12-02 MED FILL — tiZANidine HCL 4 MG TABS: 4 | 5 days supply | Qty: 15 | Fill #0

## 2016-12-02 MED FILL — MELOXICAM 7.5 MG TABLET: 7.5 | 14 days supply | Qty: 14 | Fill #0

## 2016-12-02 NOTE — ED Provider Notes (Signed)
[facility]PT [facility] Provider Note   CSN: [id number] Arrival date & time: 12/02/16  [id number]     History   Chief Complaint Chief Complaint  Patient presents with  . Shoulder Pain    HPI Jessica [name] is a 55 y.o. female.  HPI   Patient presents with right shoulder, right neck, right chest pain that began 1 month ago.  Pain in her shoulder is stabbing, worse with movement and palpation.  She had a work-related injury to her right shoulder in 1997.  Pt did fall in the shower onto her right shoulder a few months ago and lifted a heavy patient a few weeks ago.  The right chest pain is dull and heavy, better at rest.  Recently has had SOB with exertion, has had bilateral lower extremity edema x 1 month.   Currently taking phentermine for weight loss.  Has taken flexeril and ibuprofen for pain without improvement.   Patient reports she had a treadmill stress test this summer.    Past Medical History:  Diagnosis Date  . Anal fissure    history  . Angioedema of lips 03/21/2011   Presumed 2nd to ACE   . Arthritis    feet  . BV (bacterial vaginosis)    recent dx - finish med 12/22/11  . Depression     on meds  . Headache(784.0)   . HSV (herpes simplex virus) infection   . Hypertension   . Insomnia    on meds  . Migraines   . Obesity   . Sleep apnea    occas. uses CPAP    Patient Active Problem List   Diagnosis Date Noted  . Routine health maintenance 08/28/2016  . Anxiety 08/28/2016  . Pain in joint of right shoulder 04/25/2016  . Right hand pain 04/16/2016  . Right cervical radiculopathy 03/12/2016  . Primary osteoarthritis of right knee 02/19/2015  . Essential hypertension 02/19/2015  . Left-sided low back pain without sciatica 01/04/2015  . Polyuria 01/03/2015  . Left knee pain 10/31/2014  . Back pain 09/20/2014  . Pain of right breast 06/26/2014  . Melasma 05/24/2014  . Insomnia 01/19/2014  . Hx Herpes simplex virus type 1 (HSV-1) dermatitis 07/22/2013  .  Trigeminal neuralgia 01/18/2013  . Skin hypopigmentation 09/16/2012  . Blurry vision, bilateral 07/31/2012  . Leg pain 03/10/2012  . Menorrhagia 09/16/2011  . Allergic rhinitis 04/10/2011  . 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 Derangement of medial meniscus of right knee 02/06/2009  . HTN, goal below 140/90 09/27/2008  . VENOUS INSUFFICIENCY, LEGS 07/07/2008  . SLEEP APNEA, OBSTRUCTIVE 03/24/2008  . 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 ARTHRITIS, ACROMIOCLAVICULAR 11/24/2007  . ROTATOR CUFF SPRAIN AND STRAIN 11/24/2007  . MIGRAINE NOS W/O INTRACTABLE MIGRAINE 08/23/2007  . Morbid obesity ([place]) 06/04/2007  . Depression 06/04/2007    Past Surgical History:  Procedure Laterality Date  . CESAREAN SECTION     x 3  . CHOLECYSTECTOMY    . ENDOMETRIAL ABLATION    . KNEE SURGERY  oct 2013   minesctomy and debridement  . NECK SURGERY  2001   cervical fusion C4/5  . TUBAL LIGATION  1992    OB History    Gravida Para Term Preterm AB Living   3 3 3  0 0 3   SAB TAB Ectopic Multiple Live Births   0 0 0 0 3       Home Medications    Prior to Admission medications  Medication Sig Start Date End Date Taking? Authorizing Provider  amLODipine (NORVASC) 10 MG tablet Take 1 tablet (10 mg total) by mouth daily. 10/25/16  Yes Mercy Riding, MD  cetirizine (ZYRTEC) 10 MG tablet TAKE 1 TABLET (10 MG) BY MOUTH DAILY AS NEEDED 10/06/16  Yes Mercy Riding, MD  DULoxetine (CYMBALTA) 60 MG capsule Take 1 capsule (60 mg total) by mouth daily. 08/28/16  Yes Mercy Riding, MD  fluticasone (FLONASE) 50 MCG/ACT nasal spray PLACE 2 SPRAYS INTO BOTH NOSTRILS DAILY. 03/25/16  Yes Mariel Aloe, MD  montelukast (SINGULAIR) 10 MG tablet Take 1 tablet (10 mg total) by mouth at bedtime. 03/25/16  Yes Mariel Aloe, MD  phentermine 37.5 MG capsule Take 37.5 mg by mouth every morning.   Yes Neldon Labella, NP  topiramate (TOPAMAX) 50 MG tablet Take 1 tablet (50 mg total) by mouth 2  (two) times daily. 08/08/16  Yes Mercy Riding, MD  VITAMIN D, CHOLECALCIFEROL, PO Take by mouth.   Yes Historical Provider, MD  meloxicam (MOBIC) 7.5 MG tablet Take 1 tablet (7.5 mg total) by mouth daily as needed for pain. 12/02/16   Clayton Bibles, PA-C  metroNIDAZOLE (FLAGYL) 500 MG tablet Take 1 tablet (500 mg total) by mouth 2 (two) times daily. Patient not taking: Reported on 11/17/2016 10/31/16   Johnatha Zeidman Filbert, MD  omeprazole (PRILOSEC) 20 MG capsule Take 1 capsule (20 mg total) by mouth 2 (two) times daily before a meal. 11/17/16   Noland Fordyce, PA-C  pantoprazole (PROTONIX) 20 MG tablet Take 1 tablet (20 mg total) by mouth daily. 09/16/16   Heather Laisure, PA-C  polyethylene glycol powder (GLYCOLAX/MIRALAX) powder Take 17 g by mouth daily as needed for moderate constipation or severe constipation. 12/27/15   Mariel Aloe, MD  tiZANidine (ZANAFLEX) 4 MG capsule Take 1 capsule (4 mg total) by mouth 3 (three) times daily as needed for muscle spasms (and pain). 12/02/16   Clayton Bibles, PA-C  triamterene-hydrochlorothiazide (DYAZIDE) 37.5-25 MG capsule Take 1 each (1 capsule total) by mouth daily. 08/08/16   Mercy Riding, MD  VENTOLIN HFA 108 (90 Base) MCG/ACT inhaler INHALE 2 PUFFS INTO THE LUNGS EVERY 6 (SIX) HOURS AS NEEDED FOR WHEEZING OR SHORTNESS OF BREATH. 05/13/16   Mariel Aloe, MD    Family History Family History  Problem Relation Age of Onset  . Diabetes Mother   . Heart failure Mother   . Hypertension Mother   . Stroke Mother   . Diabetes Father   . Diabetes Sister   . Cancer Paternal Aunt     cevical  . Cancer Paternal Aunt     cervical  . Heart attack Brother 49    Social History Social History  Substance Use Topics  . Smoking status: Never Smoker  . Smokeless tobacco: Never Used  . Alcohol use No     Comment: socially     Allergies   Ace inhibitors; Angiotensin receptor blockers; and Latex   Review of Systems Review of Systems  All other systems reviewed and are  negative.    Physical Exam Updated Vital Signs BP 139/84   Pulse 75   Temp 97.8 F (36.6 C) (Oral)   Resp 12   Ht 5\' 1"  (1.549 m)   Wt 111.6 kg   SpO2 100%   BMI 46.48 kg/m   Physical Exam  Constitutional: She appears well-developed and well-nourished. No distress.  HENT:  Head: Normocephalic and atraumatic.  Neck: Neck  supple.  Cardiovascular: Normal rate and regular rhythm.   Pulmonary/Chest: Effort normal and breath sounds normal. No respiratory distress. She has no wheezes. She has no rales.  Abdominal: Soft. She exhibits no distension. There is no tenderness. There is no rebound and no guarding.  Musculoskeletal:  Lower extremities symmetric.  No erythema, warmth, tenderness.    Mild tenderness to palpation through right chest and right shoulder, pain with active movement of shoulder.  Right trapezius and right posterior neck musculature with spasm and tenderness.    Neurological: She is alert.  Skin: She is not diaphoretic.  Nursing note and vitals reviewed.    ED Treatments / Results  Labs (all labs ordered are listed, but only abnormal results are displayed) Labs Reviewed  COMPREHENSIVE METABOLIC PANEL  CBC WITH DIFFERENTIAL/PLATELET  TROPONIN I  BRAIN NATRIURETIC PEPTIDE    EKG  EKG Interpretation None       Radiology Dg Chest 2 View  Result Date: 12/02/2016 CLINICAL DATA:  Right shoulder pain radiating to the neck, face inch S EXAM: CHEST  2 VIEW COMPARISON:  01/14/2016 FINDINGS: The heart size and mediastinal contours are within normal limits. Both lungs are clear. The visualized skeletal structures are unremarkable. IMPRESSION: No active cardiopulmonary disease. Electronically Signed   By: Kathreen Devoid   On: 12/02/2016 11:01   Dg Cervical Spine Complete  Result Date: 12/02/2016 CLINICAL DATA:  Right shoulder pain radiating to the neck, no recent injury EXAM: CERVICAL SPINE - COMPLETE 4+ VIEW COMPARISON:  Cervical spine films of 07/16/2006  FINDINGS: The cervical vertebrae remain unchanged in alignment. Solid fusion at C4-5 is again noted. There is degenerative disc disease at C5-6 where there is slightly more loss of intervertebral disc space and spurring present. The remainder of intervertebral disc spaces are unremarkable. No prevertebral soft tissue swelling is seen. On oblique views, there is some foraminal narrowing at the C5-6 level. The remainder of the foramina are patent. The odontoid process is intact. The lung apices are clear. IMPRESSION: 1. Degenerative disc disease at C5-6 with mild foraminal narrowing bilaterally at this level. 2. Stable solid fusion at C4-5. Electronically Signed   By: Ivar Drape M.D.   On: 12/02/2016 11:03   Dg Shoulder Right  Result Date: 12/02/2016 CLINICAL DATA:  Right shoulder pain. EXAM: RIGHT SHOULDER - 2+ VIEW COMPARISON:  None. FINDINGS: Multiple telemetry leads are noted overlying the chest and shoulder region. No fracture or dislocation is identified. Glenohumeral joint space width is maintained. Mild-to-moderate acromioclavicular joint osteoarthrosis is noted. No focal osseous lesion or soft tissue abnormality is seen. IMPRESSION: 1. No evidence of acute osseous abnormality. 2. Acromioclavicular osteoarthrosis. Electronically Signed   By: Logan Bores M.D.   On: 12/02/2016 11:03    Procedures Procedures (including critical care time)  Medications Ordered in ED Medications - No data to display   Initial Impression / Assessment and Plan / ED Course  I have reviewed the triage vital signs and the nursing notes.  Pertinent labs & imaging results that were available during my care of the patient were reviewed by me and considered in my medical decision making (see chart for details).  Clinical Course    Afebrile nontoxic patient with 1 month of right shoulder and right neck pain, right chest pain, worse with movement.  Also notes some DOE and bilateral lower extremity swelling in the same  timeframe.  Pain is there nearly 100% if the day, relieved for only seconds at a time.  Atypical for  ACS.  Pt does have flattened T waves over lateral leads in EKG and had similar changes previously.  Has had stress test with Dr Nadyne Coombes earlier this summer, per pt.  Pt has primary care appointment tomorrow and I have encouraged her to discuss symptoms with them as well ([place]).  Pt is not SOB at rest and swelling has been bilateral.  Doubt PE.  Doubt pneumonia clinically.  Pain is largely reproducible, suspect this is musculoskeletal compounded on right shoulder remote injury + repetitive use/strain injury.  Referred to orthopedics (Pt sees Dr [name] for her knees, referred to [facility] orthopedics to remain in practice).  D/C home with symptomatic medications, PCP, ortho follow up.  Discussed result, findings, treatment, and follow up  with patient.  Pt given return precautions.  Pt verbalizes understanding and agrees with plan.      Robaxin changed after discharge to zanaflex as patient's insurance prefers it.  Also given Mobic.    Final Clinical Impressions(s) / ED Diagnoses   Final diagnoses:  Right shoulder pain, unspecified chronicity  Trapezius muscle spasm  Right-sided chest pain  DDD (degenerative disc disease), cervical  Osteoarthritis of right acromioclavicular joint    New Prescriptions Discharge Medication List as of 12/02/2016 12:07 PM    START taking these medications   Details  methocarbamol (ROBAXIN) 500 MG tablet Take 1-2 tablets (500-1,000 mg total) by mouth every 6 (six) hours as needed for muscle spasms (and pain)., Starting Tue 12/02/2016, Print         [place], PA-C 12/02/16 [place], MD 12/09/16 [id number]

## 2016-12-02 NOTE — Discharge Instructions (Signed)
Read the information below.  Use the prescribed medication as directed.  Please discuss all new medications with your pharmacist.  You may return to the Emergency Department at any time for worsening condition or any new symptoms that concern you.  If you develop worsening chest pain, shortness of breath, fever, you pass out, or become weak or dizzy, return to the ER for a recheck.   If you develop uncontrolled pain, weakness or numbness of the extremity, severe discoloration of the skin, or you are unable to move your arm, return to the ER for a recheck.

## 2016-12-02 NOTE — ED Triage Notes (Signed)
Pt reports R shoulder pain radiating to neck, face,  and chest. Denies recent injury.

## 2016-12-03 ENCOUNTER — Encounter: Payer: Self-pay | Admitting: Student

## 2016-12-03 ENCOUNTER — Telehealth: Payer: Self-pay | Admitting: Student

## 2016-12-03 ENCOUNTER — Ambulatory Visit (INDEPENDENT_AMBULATORY_CARE_PROVIDER_SITE_OTHER): Payer: Medicare Other | Admitting: Student

## 2016-12-03 VITALS — BP 120/76 | HR 92 | Temp 98.2°F | Ht 61.0 in | Wt 249.6 lb

## 2016-12-03 DIAGNOSIS — R072 Precordial pain: Secondary | ICD-10-CM

## 2016-12-03 DIAGNOSIS — Z111 Encounter for screening for respiratory tuberculosis: Secondary | ICD-10-CM | POA: Diagnosis not present

## 2016-12-03 DIAGNOSIS — Z713 Dietary counseling and surveillance: Secondary | ICD-10-CM | POA: Diagnosis not present

## 2016-12-03 DIAGNOSIS — R1310 Dysphagia, unspecified: Secondary | ICD-10-CM

## 2016-12-03 NOTE — Patient Instructions (Signed)
It was great seeing you today! We have addressed the following issues today  1. Chest pain: This is likely from muscle. It is unlikely to come from the heart to your lungs. I recommend taking ibuprofen 600 mg up to 3 times a day with food for the next 5 days, then as needed. You can also use Zanaflex, which can help with muscle spasm.  2.   Difficulty swallowing: it is unlear what is causing this at this point. However, I have very low suspicion for something serious. If this doesn't get better over the next one week, please let us know.  3.  For her neck: I have ordered a referral to orthopedics.    If we did any lab work today, and the results require attention, either me or my nurse will get in touch with you. If everything is normal, you will get a letter in mail. If you don't hear from [place] in two weeks, please give [place] a call. Otherwise, we look forward to seeing you again at your next visit. If you have any questions or concerns before then, please call the clinic at [phone].   Please bring all your medications to every doctors visit   Sign up for My Chart to have easy access to your labs results, and communication with your Primary care physician.     Please check-out at the front desk before leaving the clinic.    Take Care,

## 2016-12-03 NOTE — Telephone Encounter (Signed)
Medical  form dropped off for at front desk for completion.  Verified that patient section of form has been completed.  Last DOS/WCC with PCP was 12/03/16  Placed form in white team folder to be completed by clinical staff.  Carmina Miller

## 2016-12-03 NOTE — Assessment & Plan Note (Addendum)
Unclear etiology at this time. Oropharynx without lesion. Differential diagnosis are esophageal spasm, infectious etiology such as herpes but unlikely, GERD (but without heartburn or reflux). Doubt achalasia, dysmotility or malignancy with acute onset. She has no constitutional symptoms either. Will consider barium swallow if no improvement over the course of one week.

## 2016-12-03 NOTE — Assessment & Plan Note (Signed)
PPD placed. Patient to return for reading in 48-72 hours

## 2016-12-03 NOTE — Progress Notes (Signed)
   Subjective:    Patient ID: Jessica [name] is a 55 y.o. old female. Patient here to discuss about his chest pain and pain with swallowing. 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  HPI #Right sided chest pain: this has been going on for three weeks. Right sided chest pain with breathing. Denies exertional chest pain or dyspnea. Denies diaphoresis, nausea, palpitation or orthopnea. Reports fall about month ago while getting out shower, and landed on right side. Reprots heating her head but denies LOC. She was sore all over her right side of the body after fall that has resolved. She did not seek medical care at that time. Also reports pain in right shoulder.   She had chest x-ray in ED yesterday that showed arthritic changes in her right shoulder. EKG was also normal.  Overall, she states that her pain is getting worse. She didn't try pain medication out of concern for its interference with her future bariatric surgery.  #Pain with swallowing for 2 days. Pain is sore when she tries to swallow. She feels like something stuck. She tried to drink a milkshake this morning and felt the pain. Denies cold-like symptoms such as runny nose, congestion or cough. Denies hurt burn or reflux. Denies night sweat, unintentional weight loss, fever or chills. Denies recent illness or history of herpes  SH: Denies smoking or drinking  Review of Systems Per HPI Objective:   Vitals:   12/03/16 1040 12/03/16 1127  BP: (!) 142/86 120/76  Pulse: 92   Temp: 98.2 F (36.8 C)   TempSrc: Oral   SpO2: 98%   Weight: 249 lb 9.6 oz (113.2 kg)   Height: 5\' 1"  (1.549 m)     GEN: appears well, no apparent distress. Oropharynx: mmm without erythema or exudation or lesion HEM: Negative for cervical and supraclavicular lymphadenopathy CVS: RRR, normal s1 and s2, no murmurs, no edema RESP: no increased work of breathing, good air movement bilaterally, no crackles or wheeze MSK: Tender to palpation over her right  upper chest. Full range of motion in her right shoulders. Epley maneuver positive in her right shoulder. No tenderness to palpation.  SKIN: no apparent skin lesion NEURO: alert and oriented appropriately, no gross defecits  PSYCH: appropriate mood and affect     Assessment & Plan:  Chest pain Likely musculoskeletal. Pain is localized to her right chest and is reproducible with palpation. She had a normal EKG in ED yesterday. Ibuprofen 600 mg 3 times a day as needed for pain. She also had a prescription for Zanaflex from ED yesterday that she she hasn't started taking. Discussed return precautions.   Odynophagia Unclear etiology at this time. Oropharynx without lesion. Differential diagnosis are esophageal spasm, infectious etiology such as herpes but unlikely, GERD (but without heartburn or reflux). Doubt achalasia, dysmotility or malignancy with acute onset. She has no constitutional symptoms either. Will consider barium swallow if no improvement over the course of one week.  Screening-pulmonary TB PPD placed. Patient to return for reading in 48-72 hours

## 2016-12-03 NOTE — Assessment & Plan Note (Signed)
Likely musculoskeletal. Pain is localized to her right chest and is reproducible with palpation. She had a normal EKG in ED yesterday. Ibuprofen 600 mg 3 times a day as needed for pain. She also had a prescription for Zanaflex from ED yesterday that she she hasn't started taking. Discussed return precautions.

## 2016-12-04 NOTE — Telephone Encounter (Signed)
Placed form in PCP box. Nat Christen, CMA

## 2016-12-10 ENCOUNTER — Other Ambulatory Visit: Payer: Self-pay | Admitting: Student

## 2016-12-10 ENCOUNTER — Telehealth: Payer: Self-pay | Admitting: Student

## 2016-12-10 DIAGNOSIS — I10 Essential (primary) hypertension: Secondary | ICD-10-CM | POA: Diagnosis not present

## 2016-12-10 DIAGNOSIS — G4733 Obstructive sleep apnea (adult) (pediatric): Secondary | ICD-10-CM | POA: Diagnosis not present

## 2016-12-10 DIAGNOSIS — F331 Major depressive disorder, recurrent, moderate: Secondary | ICD-10-CM

## 2016-12-10 NOTE — Telephone Encounter (Signed)
Call patient to discuss about her form. Patient had a PPD placed on 12/03/2016. She says she forgot to come back for the read. She states she will call the office to schedule a nurse visit to have another PPD placed.   We also talked about her recent cervical spine imaging which showed DDD at C5-6 with mild foraminal narrowing bilaterally at this level. I initially told her we might consider referral to orthopedics. However, after reviewing the imaging, I do not think there is any need for referral at this point. Patient agrees with this and appreciated the call.

## 2016-12-17 ENCOUNTER — Ambulatory Visit (INDEPENDENT_AMBULATORY_CARE_PROVIDER_SITE_OTHER): Payer: Medicare Other | Admitting: *Deleted

## 2016-12-17 DIAGNOSIS — Z111 Encounter for screening for respiratory tuberculosis: Secondary | ICD-10-CM | POA: Diagnosis not present

## 2016-12-17 NOTE — Progress Notes (Signed)
   PPD placed Left Forearm.  Pt to return 12/19/16 for reading.  Pt tolerated intradermal injection. Derl Barrow, RN

## 2016-12-19 ENCOUNTER — Ambulatory Visit (INDEPENDENT_AMBULATORY_CARE_PROVIDER_SITE_OTHER): Payer: Medicare Other | Admitting: *Deleted

## 2016-12-19 ENCOUNTER — Encounter: Payer: Self-pay | Admitting: *Deleted

## 2016-12-19 DIAGNOSIS — G4733 Obstructive sleep apnea (adult) (pediatric): Secondary | ICD-10-CM | POA: Diagnosis not present

## 2016-12-19 DIAGNOSIS — Z111 Encounter for screening for respiratory tuberculosis: Secondary | ICD-10-CM

## 2016-12-19 DIAGNOSIS — Z9989 Dependence on other enabling machines and devices: Secondary | ICD-10-CM | POA: Diagnosis not present

## 2016-12-19 LAB — TB SKIN TEST
Induration: 0 mm
TB SKIN TEST: NEGATIVE

## 2016-12-19 NOTE — Progress Notes (Signed)
   PPD Reading Note PPD read and results entered in EpicCare. Result: 0 mm induration. Interpretation: Negative If test not read within 48-72 hours of initial placement, patient advised to repeat in other arm 1-3 weeks after this test. Allergic reaction: no  Levette Paulick L, RN  

## 2016-12-25 NOTE — Telephone Encounter (Signed)
Patient informed that form is complete and ready for pick up.  Wiatt Mahabir L, RN  

## 2017-01-05 ENCOUNTER — Other Ambulatory Visit: Payer: Self-pay | Admitting: *Deleted

## 2017-01-05 DIAGNOSIS — B001 Herpesviral vesicular dermatitis: Secondary | ICD-10-CM

## 2017-01-05 MED ORDER — MONTELUKAST SODIUM 10 MG PO TABS: 10.0000 mg | ORAL_TABLET | Freq: Every day | ORAL | 5 refills | Status: DC

## 2017-01-05 MED ORDER — ACYCLOVIR 5 % EX CREA: 1.0000 "application " | TOPICAL_CREAM | CUTANEOUS | 0 refills | Status: DC

## 2017-01-16 ENCOUNTER — Other Ambulatory Visit: Payer: Self-pay | Admitting: Student

## 2017-01-16 ENCOUNTER — Telehealth: Payer: Self-pay | Admitting: Student

## 2017-01-16 DIAGNOSIS — B001 Herpesviral vesicular dermatitis: Secondary | ICD-10-CM

## 2017-01-16 NOTE — Telephone Encounter (Signed)
When pt called pharmacy to refill zovirax cream, she was told she needed to have dr to authorize it so insurance will cover it.  [facility] to [facility]

## 2017-01-16 NOTE — Telephone Encounter (Signed)
Pt states the pharmacy was unable to refill cold sore cream unless Rx is written a certain way. [facility] uses [facility] [address]. ep

## 2017-01-22 DIAGNOSIS — G4733 Obstructive sleep apnea (adult) (pediatric): Secondary | ICD-10-CM | POA: Diagnosis not present

## 2017-01-24 MED ORDER — ACYCLOVIR 5 % EX CREA: 1.0000 "application " | TOPICAL_CREAM | CUTANEOUS | 0 refills | Status: DC

## 2017-01-24 NOTE — Telephone Encounter (Signed)
Called patient to call pharmacy so they can send me the PA paper. I don't see this in my inbasket. Thanks! [name]

## 2017-01-29 ENCOUNTER — Other Ambulatory Visit: Payer: Self-pay | Admitting: Student

## 2017-01-30 DIAGNOSIS — G4733 Obstructive sleep apnea (adult) (pediatric): Secondary | ICD-10-CM | POA: Diagnosis not present

## 2017-01-30 DIAGNOSIS — Z01818 Encounter for other preprocedural examination: Secondary | ICD-10-CM | POA: Diagnosis not present

## 2017-01-30 DIAGNOSIS — I10 Essential (primary) hypertension: Secondary | ICD-10-CM | POA: Diagnosis not present

## 2017-02-03 ENCOUNTER — Other Ambulatory Visit: Payer: Self-pay | Admitting: Student

## 2017-02-03 ENCOUNTER — Telehealth: Payer: Self-pay | Admitting: *Deleted

## 2017-02-03 DIAGNOSIS — B0089 Other herpesviral infection: Secondary | ICD-10-CM

## 2017-02-03 MED ORDER — ACYCLOVIR 5 % EX OINT: 1.0000 "application " | TOPICAL_OINTMENT | CUTANEOUS | 1 refills | Status: DC

## 2017-02-03 NOTE — Telephone Encounter (Signed)
Received fax from [facility] stating patient's insurance will not cover Zovirax 5% cream.  Patient's insurance will cover Acyclovir ointment.  Please send in new Rx for Acyclovir ointment.  Derl Barrow, RN

## 2017-02-04 ENCOUNTER — Ambulatory Visit (INDEPENDENT_AMBULATORY_CARE_PROVIDER_SITE_OTHER): Payer: Medicare Other | Admitting: Family Medicine

## 2017-02-04 VITALS — BP 115/60 | HR 80 | Temp 98.1°F | Ht 61.0 in | Wt 234.4 lb

## 2017-02-04 DIAGNOSIS — F458 Other somatoform disorders: Secondary | ICD-10-CM | POA: Diagnosis not present

## 2017-02-04 DIAGNOSIS — R0989 Other specified symptoms and signs involving the circulatory and respiratory systems: Secondary | ICD-10-CM

## 2017-02-04 NOTE — Progress Notes (Signed)
Subjective:     Patient ID: Jessica [name], female   DOB: [birth year], 56 y.o.   MRN: [id number]  HPI Whiten is a 56 year old female presenting today for abnormal sensation in throat. Reports she is planning to have a gastric sleeve surgery on February 12. Her surgeon requested she be evaluated for this sensation prior to surgery due to the anesthesia that will be required. Has had sensation for approximately 1 week. She feels like something is on her throat muscle and irritating it. Also notes feelings of regurgitation, sometimes having undigested food coming back up into her mouth. Denies abdominal pain. Is not get worse at night. Feels the sensation more when she is drinking or swallowing but the sensation is always there. Denies difficulty swallowing liquids or solids. Sensation is not painful, just irritating. Has also been clearing her throat often. Denies nausea, vomiting, postnasal drip. Notes history of allergies and is prescribed Zyrtec and Singulair. Prescribed omeprazole for acid reflux, however she does not take this very often. TSH normal in July 2016. Nonsmoker  Review of Systems Per HPI    Objective:   Physical Exam  Constitutional: She appears well-developed and well-nourished. No distress.  HENT:  Head: Normocephalic and atraumatic.  Mouth/Throat: Oropharynx is clear and moist.  Neck: Normal range of motion. No thyromegaly present.  Cardiovascular: Normal rate and regular rhythm.   No murmur heard. Pulmonary/Chest: Effort normal. No respiratory distress. She has no wheezes.  Lymphadenopathy:    She has no cervical adenopathy.  Psychiatric: She has a normal mood and affect. Her behavior is normal.       Assessment and Plan:     1. Globus sensation Suspect secondary to acid reflux given globus sensation, frequent throat clearing, regurgitation. Encouraged to take omeprazole daily. Given pending surgery on February 12 and need for further evaluation by anesthesia, we do  not have the lecture to monitor effectiveness of PPIs for escalating diagnostic studies. Will refer to ENT for further evaluation and direct visualization if needed. Follow-up with ENT on February 8 at 1 PM. Discussed if ENT recommend delay of surgery, her surgery may need to be postponed. Follow up with PCP after surgery.

## 2017-02-04 NOTE — Patient Instructions (Addendum)
Thank you so much for coming to visit today! I think we should get this feeling in your throat evaluated due to your upcoming surgery. I would recommend taking your Omeprazole daily until you follow up with ENT as acid reflux can cause symptoms of something stuck in your throat, regurgitation, and throat clearing. I have placed a referral to ENT to have this further evaluated and they can see you tomorrow (2/8) at 1pm. Please let us know if there is anything else we can do for you! Good luck with your surgery! Please follow up with your PCP following surgery.  Dr. Gerlean Ren

## 2017-02-05 DIAGNOSIS — R1313 Dysphagia, pharyngeal phase: Secondary | ICD-10-CM | POA: Diagnosis not present

## 2017-02-09 DIAGNOSIS — Z888 Allergy status to other drugs, medicaments and biological substances status: Secondary | ICD-10-CM | POA: Diagnosis not present

## 2017-02-09 DIAGNOSIS — Z9884 Bariatric surgery status: Secondary | ICD-10-CM

## 2017-02-09 DIAGNOSIS — Z91040 Latex allergy status: Secondary | ICD-10-CM | POA: Diagnosis not present

## 2017-02-09 DIAGNOSIS — G4733 Obstructive sleep apnea (adult) (pediatric): Secondary | ICD-10-CM | POA: Diagnosis not present

## 2017-02-09 DIAGNOSIS — R7301 Impaired fasting glucose: Secondary | ICD-10-CM | POA: Diagnosis not present

## 2017-02-09 DIAGNOSIS — I10 Essential (primary) hypertension: Secondary | ICD-10-CM | POA: Diagnosis not present

## 2017-02-09 DIAGNOSIS — K660 Peritoneal adhesions (postprocedural) (postinfection): Secondary | ICD-10-CM | POA: Diagnosis not present

## 2017-02-09 DIAGNOSIS — J4520 Mild intermittent asthma, uncomplicated: Secondary | ICD-10-CM | POA: Diagnosis not present

## 2017-02-09 DIAGNOSIS — K5900 Constipation, unspecified: Secondary | ICD-10-CM | POA: Diagnosis not present

## 2017-02-09 DIAGNOSIS — Z9989 Dependence on other enabling machines and devices: Secondary | ICD-10-CM | POA: Diagnosis not present

## 2017-02-09 HISTORY — DX: Bariatric surgery status: Z98.84

## 2017-02-16 ENCOUNTER — Telehealth: Payer: Self-pay | Admitting: Student

## 2017-02-16 NOTE — Telephone Encounter (Signed)
Called patient in response the message below. She reports three falls in the last one month from losing her balance and knee giving away on her. Recommended office visit for formal evaluation and documentation before we order her roller walker. She voiced understanding and agrees. She will call the office in the morning to make the apt.

## 2017-02-16 NOTE — Telephone Encounter (Signed)
Pt would like to have a roller walker with seat a seat. ep

## 2017-02-20 ENCOUNTER — Ambulatory Visit (INDEPENDENT_AMBULATORY_CARE_PROVIDER_SITE_OTHER): Payer: Medicare Other | Admitting: Student

## 2017-02-20 ENCOUNTER — Encounter: Payer: Self-pay | Admitting: Student

## 2017-02-20 VITALS — BP 126/78 | Temp 98.2°F | Ht 61.0 in | Wt 231.4 lb

## 2017-02-20 DIAGNOSIS — G8929 Other chronic pain: Secondary | ICD-10-CM

## 2017-02-20 DIAGNOSIS — W19XXXA Unspecified fall, initial encounter: Secondary | ICD-10-CM | POA: Insufficient documentation

## 2017-02-20 DIAGNOSIS — M25561 Pain in right knee: Secondary | ICD-10-CM

## 2017-02-20 NOTE — Patient Instructions (Signed)
It was great seeing you today! We have addressed the following issues today 1. Knee pain: this is likely due to osteoarthritis. I'll order your walker with a seat. Someone from [facility] will get in touch with you.   If we did any lab work today, and the results require attention, either me or my nurse will get in touch with you. If everything is normal, you will get a letter in mail and a message via . If you don't hear from [place] in two weeks, please give [place] a call. Otherwise, we look forward to seeing you again at your next visit. If you have any questions or concerns before then, please call the clinic at [phone].  Please bring all your medications to every doctors visit  Sign up for My Chart to have easy access to your labs results, and communication with your Primary care physician.    Please check-out at the front desk before leaving the clinic.    Take Care,   Dr. Cyndia Skeeters

## 2017-02-20 NOTE — Progress Notes (Signed)
Subjective:    Jessica [name] is a 56 y.o. old female here for fall  HPI Fall: fell x3 at home. She says her right knee gave out. Denies dizziness, chest pain, shortness of breath or palpitation. Reports hitting her head against a cabinet once. Denies LOC or injury. She slipped and fell on her right knee about 5 years ago. Knee X-ray at that time with severe degenerative changes without fracture. She was told she has severe OA in right knee. Knee pain 5/10 right now. Pain is achy. Pain radiates from her right knee down to the foot laterally.  Reports occasional back pain. Denies fever, numbness, weakness, urinary or bowel movement issue, night's weight or unintentional weight loss.   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; Chest pain; ROTATOR CUFF SPRAIN AND STRAIN; CONGENITAL PES PLANUS; Allergic rhinitis; Menorrhagia; Leg pain; Blurry vision, bilateral; Skin hypopigmentation; Trigeminal neuralgia; Hx Herpes simplex virus type 1 (HSV-1) dermatitis; Insomnia; Melasma; Pain of right breast; Back pain; Left knee pain; Polyuria; Left-sided low back pain without sciatica; Primary osteoarthritis of right knee; Essential hypertension; Right cervical radiculopathy; Right hand pain; Pain in joint of right shoulder; Routine health maintenance; Anxiety; Odynophagia; Screening-pulmonary TB; Right knee pain; and Fall on her problem list.   has a past medical history of Anal fissure; Angioedema of lips (03/21/2011); Arthritis; BV (bacterial vaginosis); Depression; Headache(784.0); HSV (herpes simplex virus) infection; Hypertension; Insomnia; Migraines; Obesity; and Sleep apnea.  FH:  Family History  Problem Relation Age of Onset  . Diabetes Mother   . Heart  failure Mother   . Hypertension Mother   . Stroke Mother   . Diabetes Father   . Diabetes Sister   . Cancer Paternal Aunt     cevical  . Cancer Paternal Aunt     cervical  . Heart attack Brother 4    [place] Social History  Substance Use Topics  . Smoking status: Never Smoker  . Smokeless tobacco: Never Used  . Alcohol use No     Comment: socially    Review of Systems Review of systems negative except for pertinent positives and negatives in history of present illness above.     Objective:     Vitals:   02/20/17 0924  BP: 126/78  Temp: 98.2 F (36.8 C)  TempSrc: Oral  Weight: 231 lb 6.4 oz (105 kg)  Height: 5\' 1"  (1.549 m)    Physical Exam GEN: appears obese, no apparent distress. CVS: RRR, nl S1&S2, no murmurs, no edema,  2+ DP & PT pulses bilaterally RESP: speaks in full sentence, no IWOB, good air movement bilaterally, CTAB GI: BS present & normal, soft, NTND MSK: right knee No bony deformities, inflammation/skin lesion, both knees appears equal. No effusion or bulge. No baker's cyst. Full ROM (extension/flexion). Positive for crepitus. Tenderness to palpation over the joint line medially. Positive valgus stress test. Negative anterior drawer and posterior drawer tests. Motor 5/5, lgiht sensation intact, patellar reflex 1+ bilaterally.  SKIN: no apparent skin lesion  NEURO: alert and oiented appropriately,  motor 5/5 bilaterally, lgiht sensation intact, patellar reflex 1+ bilaterally.  PSYCH: euthymic mood with congruent affect    Assessment and Plan:  Right knee pain Patient with 3 episodes of fall within the last one month due to right knee osteoarthritis. Exam remarkable for tenderness to palpation  over the joint line of right knee medially, positive valgus tests and crepitus. No signs of septic joints or baker cyst. Neuro exam within normal limits. No red flags. No calf swelling or tenderness to suggest DVT. Her weight has significant contribution to this. She  just has had bariatric surgery for weight reduction. Hope this will help with the knee pain. Will order a walker with a seat and forward inbasket message to Darlina Guys at [employer]. Will consider referral to orthopedics in the future if worsening pain.   Fall Fall due to her right knee giving out on her. She has bad osteoarthritis based on X-ray in 2013. Ordered walker with a seat for now. Will consider referral to orthopedics if worsening pain or recurrence of fall. Hopefully her pain would improve after her bariatric surgery that she just had.      Orders Placed This Encounter  Procedures  . For home use only DME 4 wheeled rolling walker with seat    Patient had three falls within a month due to bad knee OA    Order Specific Question:   Patient needs a walker to treat with the following condition    Answer:   Fall [id number]    Return if symptoms worsen or fail to improve.  Mercy Riding, MD 02/20/17 Pager: [phone]

## 2017-02-20 NOTE — Assessment & Plan Note (Signed)
Fall due to her right knee giving out on her. She has bad osteoarthritis based on X-ray in 2013. Ordered walker with a seat for now. Will consider referral to orthopedics if worsening pain or recurrence of fall. Hopefully her pain would improve after her bariatric surgery that she just had.

## 2017-02-20 NOTE — Assessment & Plan Note (Signed)
Patient with 3 episodes of fall within the last one month due to right knee osteoarthritis. Exam remarkable for tenderness to palpation over the joint line of right knee medially, positive valgus tests and crepitus. No signs of septic joints or baker cyst. Neuro exam within normal limits. No red flags. No calf swelling or tenderness to suggest DVT. Her weight has significant contribution to this. She just has had bariatric surgery for weight reduction. Hope this will help with the knee pain. Will order a walker with a seat and forward inbasket message to Darlina Guys at [employer]. Will consider referral to orthopedics in the future if worsening pain.

## 2017-02-25 DIAGNOSIS — M25561 Pain in right knee: Secondary | ICD-10-CM | POA: Diagnosis not present

## 2017-02-25 DIAGNOSIS — Z9181 History of falling: Secondary | ICD-10-CM | POA: Diagnosis not present

## 2017-02-25 DIAGNOSIS — G4733 Obstructive sleep apnea (adult) (pediatric): Secondary | ICD-10-CM | POA: Diagnosis not present

## 2017-02-25 DIAGNOSIS — R269 Unspecified abnormalities of gait and mobility: Secondary | ICD-10-CM | POA: Diagnosis not present

## 2017-02-25 DIAGNOSIS — G8929 Other chronic pain: Secondary | ICD-10-CM | POA: Diagnosis not present

## 2017-02-26 DIAGNOSIS — Z713 Dietary counseling and surveillance: Secondary | ICD-10-CM | POA: Diagnosis not present

## 2017-02-26 DIAGNOSIS — Z9884 Bariatric surgery status: Secondary | ICD-10-CM | POA: Diagnosis not present

## 2017-03-17 DIAGNOSIS — M7989 Other specified soft tissue disorders: Secondary | ICD-10-CM | POA: Diagnosis not present

## 2017-03-24 ENCOUNTER — Ambulatory Visit: Payer: Medicare Other | Admitting: *Deleted

## 2017-03-26 DIAGNOSIS — R110 Nausea: Secondary | ICD-10-CM | POA: Diagnosis not present

## 2017-03-26 DIAGNOSIS — Z91040 Latex allergy status: Secondary | ICD-10-CM | POA: Diagnosis not present

## 2017-03-26 DIAGNOSIS — I10 Essential (primary) hypertension: Secondary | ICD-10-CM | POA: Diagnosis not present

## 2017-03-26 DIAGNOSIS — N200 Calculus of kidney: Secondary | ICD-10-CM | POA: Diagnosis not present

## 2017-03-26 DIAGNOSIS — Z79899 Other long term (current) drug therapy: Secondary | ICD-10-CM | POA: Diagnosis not present

## 2017-03-26 DIAGNOSIS — K5730 Diverticulosis of large intestine without perforation or abscess without bleeding: Secondary | ICD-10-CM | POA: Diagnosis not present

## 2017-03-26 DIAGNOSIS — Z981 Arthrodesis status: Secondary | ICD-10-CM | POA: Diagnosis not present

## 2017-03-26 DIAGNOSIS — K5900 Constipation, unspecified: Secondary | ICD-10-CM | POA: Diagnosis not present

## 2017-03-26 DIAGNOSIS — Z9884 Bariatric surgery status: Secondary | ICD-10-CM | POA: Diagnosis not present

## 2017-03-26 DIAGNOSIS — Z888 Allergy status to other drugs, medicaments and biological substances status: Secondary | ICD-10-CM | POA: Diagnosis not present

## 2017-03-26 DIAGNOSIS — R1012 Left upper quadrant pain: Secondary | ICD-10-CM | POA: Diagnosis not present

## 2017-03-26 DIAGNOSIS — J45909 Unspecified asthma, uncomplicated: Secondary | ICD-10-CM | POA: Diagnosis not present

## 2017-03-30 ENCOUNTER — Telehealth: Payer: Self-pay | Admitting: *Deleted

## 2017-03-30 NOTE — Telephone Encounter (Signed)
Patient was willing to reschedule her annual wellness visit but no current appointment slots were convenient. She will call us back to confirm, schedule permitting.

## 2017-04-03 ENCOUNTER — Other Ambulatory Visit: Payer: Self-pay | Admitting: Student

## 2017-04-20 ENCOUNTER — Encounter: Payer: Self-pay | Admitting: Family Medicine

## 2017-04-20 ENCOUNTER — Ambulatory Visit (INDEPENDENT_AMBULATORY_CARE_PROVIDER_SITE_OTHER): Payer: Medicare Other | Admitting: Family Medicine

## 2017-04-20 VITALS — BP 118/70 | Temp 98.2°F | Ht 61.0 in | Wt 216.0 lb

## 2017-04-20 DIAGNOSIS — M25512 Pain in left shoulder: Secondary | ICD-10-CM

## 2017-04-20 DIAGNOSIS — G8929 Other chronic pain: Secondary | ICD-10-CM | POA: Insufficient documentation

## 2017-04-20 MED ORDER — METHYLPREDNISOLONE ACETATE 80 MG/ML IJ SUSP
40.0000 mg | Freq: Once | INTRAMUSCULAR | Status: AC
  Administered 2017-04-20: 40 mg via INTRA_ARTICULAR

## 2017-04-20 NOTE — Patient Instructions (Signed)
Osteoarthritis  Osteoarthritis is a type of arthritis that affects tissue that covers the ends of bones in joints (cartilage). Cartilage acts as a cushion between the bones and helps them move smoothly. Osteoarthritis results when cartilage in the joints gets worn down. Osteoarthritis is sometimes called "wear and tear" arthritis.  Osteoarthritis is the most common form of arthritis. It often occurs in older people. It is a condition that gets worse over time (a progressive condition). Joints that are most often affected by this condition are in:  · Fingers.  · Toes.  · Hips.  · Knees.  · Spine, including neck and lower back.    What are the causes?  This condition is caused by age-related wearing down of cartilage that covers the ends of bones.  What increases the risk?  The following factors may make you more likely to develop this condition:  · Older age.  · Being overweight or obese.  · Overuse of joints, such as in athletes.  · Past injury of a joint.  · Past surgery on a joint.  · Family history of osteoarthritis.    What are the signs or symptoms?  The main symptoms of this condition are pain, swelling, and stiffness in the joint. The joint may lose its shape over time. Small pieces of bone or cartilage may break off and float inside of the joint, which may cause more pain and damage to the joint. Small deposits of bone (osteophytes) may grow on the edges of the joint. Other symptoms may include:  · A grating or scraping feeling inside the joint when you move it.  · Popping or creaking sounds when you move.    Symptoms may affect one or more joints. Osteoarthritis in a major joint, such as your knee or hip, can make it painful to walk or exercise. If you have osteoarthritis in your hands, you might not be able to grip items, twist your hand, or control small movements of your hands and fingers (fine motor skills).  How is this diagnosed?  This condition may be diagnosed based on:  · Your medical history.  · A  physical exam.  · Your symptoms.  · X-rays of the affected joint(s).  · Blood tests to rule out other types of arthritis.    How is this treated?  There is no cure for this condition, but treatment can help to control pain and improve joint function. Treatment plans may include:  · A prescribed exercise program that allows for rest and joint relief. You may work with a physical therapist.  · A weight control plan.  · Pain relief techniques, such as:  ? Applying heat and cold to the joint.  ? Electric pulses delivered to nerve endings under the skin (transcutaneous electrical nerve stimulation, or TENS).  ? Massage.  ? Certain nutritional supplements.  · NSAIDs or prescription medicines to help relieve pain.  · Medicine to help relieve pain and inflammation (corticosteroids). This can be given by mouth (orally) or as an injection.  · Assistive devices, such as a brace, wrap, splint, specialized glove, or cane.  · Surgery, such as:  ? An osteotomy. This is done to reposition the bones and relieve pain or to remove loose pieces of bone and cartilage.  ? Joint replacement surgery. You may need this surgery if you have very bad (advanced) osteoarthritis.    Follow these instructions at home:  Activity   · Rest your affected joints as directed by your   health care provider.  · Do not drive or use heavy machinery while taking prescription pain medicine.  · Exercise as directed. Your health care provider or physical therapist may recommend specific types of exercise, such as:  ? Strengthening exercises. These are done to strengthen the muscles that support joints that are affected by arthritis. They can be performed with weights or with exercise bands to add resistance.  ? Aerobic activities. These are exercises, such as brisk walking or water aerobics, that get your heart pumping.  ? Range-of-motion activities. These keep your joints easy to move.  ? Balance and agility exercises.  Managing pain, stiffness, and swelling    · If directed, apply heat to the affected area as often as told by your health care provider. Use the heat source that your health care provider recommends, such as a moist heat pack or a heating pad.  ? If you have a removable assistive device, remove it as told by your health care provider.  ? Place a towel between your skin and the heat source. If your health care provider tells you to keep the assistive device on while you apply heat, place a towel between the assistive device and the heat source.  ? Leave the heat on for 20-30 minutes.  ? Remove the heat if your skin turns bright red. This is especially important if you are unable to feel pain, heat, or cold. You may have a greater risk of getting burned.  · If directed, put ice on the affected joint:  ? If you have a removable assistive device, remove it as told by your health care provider.  ? Put ice in a plastic bag.  ? Place a towel between your skin and the bag. If your health care provider tells you to keep the assistive device on during icing, place a towel between the assistive device and the bag.  ? Leave the ice on for 20 minutes, 2-3 times a day.  General instructions   · Take over-the-counter and prescription medicines only as told by your health care provider.  · Maintain a healthy weight. Follow instructions from your health care provider for weight control. These may include dietary restrictions.  · Do not use any products that contain nicotine or tobacco, such as cigarettes and e-cigarettes. These can delay bone healing. If you need help quitting, ask your health care provider.  · Use assistive devices as directed by your health care provider.  · Keep all follow-up visits as told by your health care provider. This is important.  Where to find more information:  · National Institute of Arthritis and Musculoskeletal and Skin Diseases: www.niams.nih.gov  · National Institute on Aging: www.nia.nih.gov  · American College of Rheumatology:  www.rheumatology.org  Contact a health care provider if:  · Your skin turns red.  · You develop a rash.  · You have pain that gets worse.  · You have a fever along with joint or muscle aches.  Get help right away if:  · You lose a lot of weight.  · You suddenly lose your appetite.  · You have night sweats.  Summary  · Osteoarthritis is a type of arthritis that affects tissue covering the ends of bones in joints (cartilage).  · This condition is caused by age-related wearing down of cartilage that covers the ends of bones.  · The main symptom of this condition is pain, swelling, and stiffness in the joint.  · There is no cure for this   condition, but treatment can help to control pain and improve joint function.  This information is not intended to replace advice given to you by your health care provider. Make sure you discuss any questions you have with your health care provider.  Document Released: 12/15/2005 Document Revised: 08/18/2016 Document Reviewed: 08/18/2016  Elsevier Interactive Patient Education © 2017 [employer]

## 2017-04-20 NOTE — Progress Notes (Signed)
   Subjective:   Jessica [name] is a 56 y.o. female with a history of HTN, migraine,rotator cuff strain, AC arthritis, morbid obesity s/p sleeve gastrectomy here for same day appt for  Chief Complaint  Patient presents with  . Shoulder Pain    EXTREMITY PAIN  Location: L shoulder under AC joint radiates to elbow Pain started: 2 days ago Pain is: burning and aching Severity: 7/10 Medications tried: bengay and tylenol - no help Recent trauma: none Similar pain previously: in R shoulder, never in L [facility] joint arthritis) Worse with lifting over head or heavy objects Better with nothing  Symptoms Redness:no Swelling:no Fever: no Weakness: no Weight loss: no Rash: no  Review of Systems:  Per HPI.   Social History: never smoker  Objective:  BP 118/70   Temp 98.2 F (36.8 C) (Oral)   Ht 5\' 1"  (1.549 m)   Wt 216 lb (98 kg)   BMI 40.81 kg/m   Gen:  56 y.o. female in NAD HEENT: NCAT, MMM, anicteric sclerae CV: RRR, no MRG Resp: Non-labored, CTAB, no wheezes noted Ext: WWP, no edema MSK: L Shoulder: Inspection reveals no abnormalities, atrophy or asymmetry. Palpation with tenderness over AC joint. ROM is full in all planes. Rotator cuff strength normal throughout. No signs of impingement with negative Neer and Hawkin's tests, empty can. Speeds and Yergason's tests normal. No labral pathology noted with negative Obrien's, negative clunk and good stability. Normal scapular function observed. Neuro: Alert and oriented, speech normal      Assessment & Plan:     Jessica [name] is a 56 y.o. female here for   Acute pain of left shoulder Most likely related to acromioclavicular joint arthritis of left shoulder Patient has known before meals joint OA of right shoulder Subacromial joint injection performed today (see procedure note) X-rays of left shoulder ordered Follow-up as needed   After discussing risks and benefits of obtaining written consent, A steroid  injection was performed at L subacromial using 1% plain Lidocaine and 40 mg of Depo-Medrol. This was well tolerated.   Virginia Crews, MD MPH PGY-3,  [facility] 04/20/2017  2:27 PM

## 2017-04-20 NOTE — Assessment & Plan Note (Signed)
Most likely related to acromioclavicular joint arthritis of left shoulder Patient has known before meals joint OA of right shoulder Subacromial joint injection performed today (see procedure note) X-rays of left shoulder ordered Follow-up as needed

## 2017-04-20 NOTE — Addendum Note (Signed)
Addended by: Katharina Caper, Lyne Khurana D on: 04/20/2017 03:37 PM   Modules accepted: Orders

## 2017-04-22 ENCOUNTER — Ambulatory Visit
Admission: RE | Admit: 2017-04-22 | Discharge: 2017-04-22 | Disposition: A | Discharge status: Home / Self Care | Payer: Medicare Other | Source: Ambulatory Visit | Attending: Family Medicine | Admitting: Family Medicine

## 2017-04-22 ENCOUNTER — Encounter: Payer: Self-pay | Admitting: Family Medicine

## 2017-04-22 DIAGNOSIS — M25512 Pain in left shoulder: Secondary | ICD-10-CM

## 2017-04-22 DIAGNOSIS — M19012 Primary osteoarthritis, left shoulder: Secondary | ICD-10-CM | POA: Diagnosis not present

## 2017-04-27 DIAGNOSIS — G4733 Obstructive sleep apnea (adult) (pediatric): Secondary | ICD-10-CM | POA: Diagnosis not present

## 2017-04-30 ENCOUNTER — Telehealth: Payer: Self-pay | Admitting: Student

## 2017-04-30 ENCOUNTER — Other Ambulatory Visit: Payer: Self-pay | Admitting: Student

## 2017-04-30 MED ORDER — VALACYCLOVIR HCL 500 MG PO TABS: 500.0000 mg | ORAL_TABLET | Freq: Two times a day (BID) | ORAL | 2 refills | Status: DC

## 2017-04-30 NOTE — Telephone Encounter (Signed)
Pt called and would like to have a refill on her

## 2017-05-07 DIAGNOSIS — Z9884 Bariatric surgery status: Secondary | ICD-10-CM | POA: Diagnosis not present

## 2017-05-07 DIAGNOSIS — I10 Essential (primary) hypertension: Secondary | ICD-10-CM | POA: Diagnosis not present

## 2017-05-07 DIAGNOSIS — K912 Postsurgical malabsorption, not elsewhere classified: Secondary | ICD-10-CM | POA: Diagnosis not present

## 2017-05-31 ENCOUNTER — Other Ambulatory Visit: Payer: Self-pay | Admitting: Student

## 2017-06-18 ENCOUNTER — Ambulatory Visit: Payer: Medicare Other | Admitting: *Deleted

## 2017-06-29 ENCOUNTER — Ambulatory Visit (INDEPENDENT_AMBULATORY_CARE_PROVIDER_SITE_OTHER): Payer: Medicare Other | Admitting: Student

## 2017-06-29 VITALS — BP 110/70 | HR 70 | Temp 97.6°F | Wt 208.4 lb

## 2017-06-29 DIAGNOSIS — M25511 Pain in right shoulder: Secondary | ICD-10-CM

## 2017-06-29 DIAGNOSIS — G4700 Insomnia, unspecified: Secondary | ICD-10-CM | POA: Diagnosis not present

## 2017-06-29 DIAGNOSIS — I10 Essential (primary) hypertension: Secondary | ICD-10-CM

## 2017-06-29 DIAGNOSIS — F341 Dysthymic disorder: Secondary | ICD-10-CM | POA: Diagnosis not present

## 2017-06-29 MED ORDER — TRAZODONE HCL 50 MG PO TABS: 25.0000 mg | ORAL_TABLET | Freq: Every evening | ORAL | 3 refills | Status: DC | PRN

## 2017-06-29 MED ORDER — MELOXICAM 15 MG PO TABS: 15.0000 mg | ORAL_TABLET | Freq: Every day | ORAL | 0 refills | Status: DC

## 2017-06-29 MED ORDER — VALACYCLOVIR HCL 500 MG PO TABS: 500.0000 mg | ORAL_TABLET | Freq: Two times a day (BID) | ORAL | 2 refills | Status: DC

## 2017-06-29 MED ORDER — SERTRALINE HCL 50 MG PO TABS: 50.0000 mg | ORAL_TABLET | Freq: Every day | ORAL | 3 refills | Status: DC

## 2017-06-29 NOTE — Assessment & Plan Note (Signed)
It is unclear how much of this is related to her mood issues as she seems to be very functional going to gym for about 2 hours 3 times a week. She is motivated about losing weight. She lost about 62 pounds over the last 1 year with lifestyle and bariatric surgery. She took herself off Cymbalta. She says she ran out of this medicine about a month ago. Although Cymbalta is an activating medication, it can still help if insomnia is due to mood issue. Patient has already tried sleep hygiene. Patient would like to try trazodone and Zoloft. We have started Zoloft at 50 mg daily. This had worked for her mood in the past. She will have a follow-up in 2 weeks.

## 2017-06-29 NOTE — Progress Notes (Signed)
Subjective:    Jessica [name] is a 56 y.o. old female here for difficulty sleeping and right shoulder pain  HPI Insomnia: this has been going on for the last 4-5 months. Recently worse. She reports difficulty falling asleep. She says she goes to bed about 2 AM and gets up at 6 AM. She feels drained when she wakes up in the morning. She also feels sleepy during the daytime about 3 and 4 PM. Denies electronic use or TV in bedroom. Sleeps with the lights off. Denies coffee or caffeine containing drinks. She used to be on phentermine for weight loss and Cymbalta for depression. However, she hasn't taken those medications in over a month. She reports stopping Cymbalta because she ran out of it. She did not call the pharmacy for refill. She goes to gym about 3 days a week. She does walking for 30 minutes, biking for 30 minutes and arm exercises for another 30 minutes for a total of one and half hour to 2 hours each session.  Patient says she was tried on trazodone and Ambien in the past that helped with insomnia.   Right shoulder pain: this is a chronic issue. Works as a Training and development officer. She says her client is a total care. Pain is aggravated by helping/moving her client, sleeping on the right side and making her hair. Denies numbness or tingling in her arms. Denies erythema or swelling. Denies history of trauma or injury. Denies fever or unintentional weight loss  PMH/Problem List: has Morbid obesity ([place]); Depression; SLEEP APNEA, OBSTRUCTIVE; MIGRAINE NOS W/O INTRACTABLE MIGRAINE; HTN, goal below 140/90; VENOUS INSUFFICIENCY, LEGS; ARTHRITIS, ACROMIOCLAVICULAR; Derangement of medial meniscus of right knee; Arthritis of great toe at metatarsophalangeal joint; Chest pain; ROTATOR CUFF SPRAIN AND STRAIN; CONGENITAL PES PLANUS; Allergic rhinitis; Menorrhagia; Leg pain; Blurry vision, bilateral; Skin hypopigmentation; Trigeminal neuralgia; Hx Herpes simplex virus type 1 (HSV-1) dermatitis; Insomnia; Melasma; Pain of  right breast; Back pain; Left knee pain; Polyuria; Left-sided low back pain without sciatica; Primary osteoarthritis of right knee; Essential hypertension; Right cervical radiculopathy; Right hand pain; Pain in joint of right shoulder; Routine health maintenance; Anxiety; Odynophagia; Screening-pulmonary TB; Right knee pain; Fall; and Acute pain of left shoulder on her problem list.   has a past medical history of Anal fissure; Angioedema of lips (03/21/2011); Arthritis; BV (bacterial vaginosis); Depression; Headache(784.0); HSV (herpes simplex virus) infection; Hypertension; Insomnia; Migraines; Obesity; and Sleep apnea.  FH:  Family History  Problem Relation Age of Onset  . Diabetes Mother   . Heart failure Mother   . Hypertension Mother   . Stroke Mother   . Diabetes Father   . Diabetes Sister   . Cancer Paternal Aunt        cevical  . Cancer Paternal Aunt        cervical  . Heart attack Brother 56    [place] Social History  Substance Use Topics  . Smoking status: Never Smoker  . Smokeless tobacco: Never Used  . Alcohol use No     Comment: socially    Review of Systems Review of systems negative except for pertinent positives and negatives in history of present illness above.     Objective:     Vitals:   06/29/17 0922  BP: 110/70  Pulse: 70  Temp: 97.6 F (36.4 C)  TempSrc: Oral  SpO2: 93%  Weight: 208 lb 6.4 oz (94.5 kg)    Physical Exam GEN: appears well, no apparent distress. CVS: RRR, nl S1&S2, no murmurs, no  edema RESP: speaks in full sentence, no IWOB, good air movement bilaterally, CTAB GI: BS present & normal, soft, NTND GU: no suprapubic or CVA tenderness MSK:  Shoulder:  Inspection reveals no abnormalities, atrophy or asymmetry.  Palpation is normal with no tenderness over AC joint or bicipital groove or trapezius muscles.  Limited active range of motion with abduction only to 150 in the right shoulder compared to 180 in the left shoulder. Passive  range of motion full bilaterally.   Positive empty can sign. Intact internal and external rotation.   No painful arc and no drop arm sign.  Neuro: C5-T1 intact except for some weakness with resisted abduction of the right shoulder  CVS: radial and ulnar pulses intact.   SKIN: no apparent skin lesion NEURO: alert and oiented appropriately, no gross defecits  PSYCH: Neatly groomed and appropriately dressed. Maintains good eye contact and is cooperative and attentive. Speech is normal volume and rate. Mood is good with congruent affect. Thought process is logical and goal directed. No suicidal or homicidal ideation. Does not appear to be responding to any internal stimuli. Able to maintain train of thought and concentrate on the questions.     Assessment and Plan:  Insomnia It is unclear how much of this is related to her mood issues as she seems to be very functional going to gym for about 2 hours 3 times a week. She is motivated about losing weight. She lost about 62 pounds over the last 1 year with lifestyle and bariatric surgery. She took herself off Cymbalta. She says she ran out of this medicine about a month ago. Although Cymbalta is an activating medication, it can still help if insomnia is due to mood issue. Patient has already tried sleep hygiene. Patient would like to try trazodone and Zoloft. We have started Zoloft at 50 mg daily. This had worked for her mood in the past. She will have a follow-up in 2 weeks.   Pain in joint of right shoulder History and exam suggestive for rotator cuff impingement syndrome. She has difficulty making here hair and sleeping on the right side.  Active abduction in the right shoulder limited to 150. She has a positive empty can sign. Will try meloxicam 15 mg daily. I also recommended heat/ice for 15 minutes about 4-5 times a day. Discussed home exercise regimen and gave her handout. If no improvement with this, I will refer her to physical therapy.    Essential hypertension Blood pressure is 110/70 today. Stopped all her blood pressure medication since she had a bariatric surgery. She is very active. She goes to [facility]. She does already of exercises for 2 hours 3 days a week. She lost about 62 pounds in the last 12 months between lifestyle change and bariatric surgery. Congratulated her on her achievement and encouraged her to keep it up. -We will continue to monitor her blood pressure   Orders Placed This Encounter  Procedures  . Basic metabolic panel   Meds ordered this encounter  Medications  . sertraline (ZOLOFT) 50 MG tablet    Sig: Take 1 tablet (50 mg total) by mouth daily.    Dispense:  30 tablet    Refill:  3  . traZODone (DESYREL) 50 MG tablet    Sig: Take 0.5-1 tablets (25-50 mg total) by mouth at bedtime as needed for sleep.    Dispense:  30 tablet    Refill:  3  . meloxicam (MOBIC) 15 MG tablet    Sig:  Take 1 tablet (15 mg total) by mouth daily.    Dispense:  30 tablet    Refill:  0  . valACYclovir (VALTREX) 500 MG tablet    Sig: Take 1 tablet (500 mg total) by mouth 2 (two) times daily.    Dispense:  10 tablet    Refill:  2   Return in about 2 weeks (around 07/13/2017) for Insomnia & Knee issue.  Mercy Riding, MD 06/29/17 Pager: [phone]

## 2017-06-29 NOTE — Patient Instructions (Addendum)
It was great seeing Jessica [name] today! We have addressed the following issues today 1. Difficulty sleeping: I have sent a prescription for trazodone to your pharmacy. We have also started Zoloft for your mood issue. This becomes his prescription and start taking them. Please come back and see [place] in 2 weeks or sooner if needed 2.   Shoulder pain: That is likely due to some inflammation in your shoulder muscle tendons from repetitive motions. I recommended heat or ice as needed. I have also sent a prescription for meloxicam to your pharmacy. I also recommend the exercise regimen we gave Jessica [name] at this visits.   If we did any lab work today, and the results require attention, either me or my nurse will get in touch with Jessica [name]. If everything is normal, Jessica [name] will get a letter in mail and a message via . If Jessica [name] don't hear from [place] in two weeks, please give [place] a call. Otherwise, we look forward to seeing Jessica [name] again at your next visit. If Jessica [name] have any questions or concerns before then, please call the clinic at [phone].  Please bring all your medications to every doctors visit  Sign up for My Chart to have easy access to your labs results, and communication with your Primary care physician.    Please check-out at the front desk before leaving the clinic.    Take Care,    Dr. Cyndia Skeeters   Secondary Shoulder Impingement Syndrome Rehab __________ times a day. Exercise E: Shoulder abduction 1. Sit in a stable chair without armrests, or stand. 2. If directed, hold a __________ weight in your left / right hand. 3. Start with your arms straight down. Turn your left / right hand so your palm faces in, toward your body. 4. Slowly lift your left / right hand out to your side. Do not lift your hand above shoulder height. ? Keep your arms straight. ? Avoid shrugging your shoulder while you do this movement. Keep your shoulder blade tucked down toward the middle of your back. 5. Hold for __________ seconds. 6. Slowly  lower your arm, and return to the starting position. Repeat __________ times. Complete this exercise __________ times a day. This information is not intended to replace advice given to Jessica [name] by your health care provider. Make sure Jessica [name] discuss any questions Jessica [name] have with your health care provider. Document Released: 12/15/2005 Document Revised: 08/21/2016 Document Reviewed: 11/17/2015 Elsevier Interactive Patient Education  2018 [employer].  questions Jessica [name] have with your health care provider. Document Released: 12/15/2005 Document Revised: 08/21/2016 Document Reviewed: 11/17/2015 Elsevier Interactive Patient Education  [employer].

## 2017-06-29 NOTE — Assessment & Plan Note (Addendum)
Blood pressure is 110/70 today. Stopped all her blood pressure medication since she had a bariatric surgery. She is very active. She goes to [facility]. She does already of exercises for 2 hours 3 days a week. She lost about 62 pounds in the last 12 months between lifestyle change and bariatric surgery. Congratulated her on her achievement and encouraged her to keep it up. -We will continue to monitor her blood pressure

## 2017-06-29 NOTE — Assessment & Plan Note (Signed)
History and exam suggestive for rotator cuff impingement syndrome. She has difficulty making here hair and sleeping on the right side.  Active abduction in the right shoulder limited to 150. She has a positive empty can sign. Will try meloxicam 15 mg daily. I also recommended heat/ice for 15 minutes about 4-5 times a day. Discussed home exercise regimen and gave her handout. If no improvement with this, I will refer her to physical therapy.

## 2017-06-30 ENCOUNTER — Encounter: Payer: Self-pay | Admitting: Student

## 2017-06-30 LAB — BASIC METABOLIC PANEL
BUN / CREAT RATIO: 21 (ref 9–23)
BUN: 15 mg/dL (ref 6–24)
CO2: 21 mmol/L (ref 20–29)
CREATININE: 0.7 mg/dL (ref 0.57–1.00)
Calcium: 9.6 mg/dL (ref 8.7–10.2)
Chloride: 108 mmol/L — ABNORMAL HIGH (ref 96–106)
GFR calc Af Amer: 114 mL/min/{1.73_m2} (ref >59)
GFR, EST NON AFRICAN AMERICAN: 99 mL/min/{1.73_m2} (ref >59)
Glucose: 83 mg/dL (ref 65–99)
Potassium: 4.1 mmol/L (ref 3.5–5.2)
SODIUM: 144 mmol/L (ref 134–144)

## 2017-06-30 NOTE — Progress Notes (Signed)
BMP normal. Sent result letter to patient.

## 2017-07-02 ENCOUNTER — Ambulatory Visit (INDEPENDENT_AMBULATORY_CARE_PROVIDER_SITE_OTHER): Payer: Medicare Other

## 2017-07-02 ENCOUNTER — Ambulatory Visit (INDEPENDENT_AMBULATORY_CARE_PROVIDER_SITE_OTHER): Payer: Medicare Other | Admitting: Orthopedic Surgery

## 2017-07-02 DIAGNOSIS — G8929 Other chronic pain: Secondary | ICD-10-CM

## 2017-07-02 DIAGNOSIS — M5441 Lumbago with sciatica, right side: Secondary | ICD-10-CM | POA: Diagnosis not present

## 2017-07-02 DIAGNOSIS — M1711 Unilateral primary osteoarthritis, right knee: Secondary | ICD-10-CM

## 2017-07-02 MED ORDER — PREDNISONE 10 MG PO TABS: ORAL_TABLET | ORAL | 0 refills | Status: DC

## 2017-07-02 MED ORDER — METHYLPREDNISOLONE ACETATE 40 MG/ML IJ SUSP
40.0000 mg | INTRAMUSCULAR | Status: AC | PRN
  Administered 2017-07-02: 40 mg via INTRA_ARTICULAR

## 2017-07-02 MED ORDER — LIDOCAINE HCL 1 % IJ SOLN
5.0000 mL | INTRAMUSCULAR | Status: AC | PRN
  Administered 2017-07-02: 5 mL

## 2017-07-02 NOTE — Progress Notes (Signed)
Office Visit Note   Patient: Jessica [name]           Date of Birth: [birth year]           MRN: [id number] Visit Date: 07/02/2017              Requested by: Mercy Riding, MD [employer], [place] [address] PCP: Mercy Riding, MD  No chief complaint on file.     HPI: Patient is a 56 year old woman who presents today for 2 separate issues. Just complaining of chronic right knee pain. + startup stiffness. She has been having ongoing issues with arthritis for many years. She is locking and catching she fell on the treadmill last month. She is working on losing weight before having a total knee replacement. She is also complaining of left-sided buttock and low back pain. This radiates laterally all the way down to her foot. This associated with some numbness and tingling. The leg feels heavy. States has had back pain in the past, nothing like this.   Has been working out, wonders if is overdoing it, causing the back pain.   Assessment & Plan: Visit Diagnoses:  1. Chronic right-sided low back pain with right-sided sciatica     Plan: Injection right knee today. Will order a pred taper for radiculopathy. Follow up in office in 4 weeks.   Follow-Up Instructions: No Follow-up on file.   Right Knee Exam   Tenderness  The patient is experiencing tenderness in the lateral joint line.  Range of Motion  The patient has normal right knee ROM.  Tests  Varus: negative Valgus: negative  Other  Erythema: absent Swelling: none   Back Exam   Tenderness  The patient is experiencing no tenderness.   Muscle Strength  The patient has normal back strength.  Tests  Straight leg raise left: positive      Patient is alert, oriented, no adenopathy, well-dressed, normal affect, normal respiratory effort.   Imaging: No results found.  Labs: Lab Results  Component Value Date   HGBA1C 5.5 03/12/2016   HGBA1C 5.5 07/09/2015   HGBA1C 5.6 01/03/2015   ESRSEDRATE 63 (H)  01/13/2011   LABORGA Multiple bacterial morphotypes present, none 09/10/2011   LABORGA predominant. Suggest appropriate recollection if  09/10/2011   LABORGA clinically indicated. 09/10/2011    Orders:  Orders Placed This Encounter  Procedures  . XR Lumbar Spine 2-3 Views   Meds ordered this encounter  Medications  . predniSONE (DELTASONE) 10 MG tablet    Sig: 6 tablets for 2 days, then 5 for 2 days, then 4 for 2 days, then 3  for 2 days, then 2 for 2 days, then 1 tablet for 2 days    Dispense:  42 tablet    Refill:  0     Procedures: Large Joint Inj Date/Time: 07/02/2017 9:24 AM Performed by: Suzan Slick Authorized by: Dondra Prader R   Consent Given by:  Patient Site marked: the procedure site was marked   Timeout: prior to procedure the correct patient, procedure, and site was verified   Indications:  Pain and diagnostic evaluation Location:  Knee Site:  R knee Needle Size:  22 G Needle Length:  1.5 inches Ultrasound Guidance: No   Fluoroscopic Guidance: No   Arthrogram: No   Medications:  5 mL lidocaine 1 %; 40 mg methylPREDNISolone acetate 40 MG/ML Aspiration Attempted: No   Patient tolerance:  Patient tolerated the procedure well with no immediate  complications    Clinical Data: No additional findings.  ROS:  All other systems negative, except as noted in the HPI. Review of Systems  Constitutional: Negative for chills and fever.  Musculoskeletal: Positive for arthralgias and back pain.  Neurological: Positive for numbness. Negative for weakness.    Objective: Vital Signs: There were no vitals taken for this visit.  Specialty Comments:  No specialty comments available.  PMFS History: Patient Active Problem List   Diagnosis Date Noted  . Acute pain of left shoulder 04/20/2017  . Right knee pain 02/20/2017  . Fall 02/20/2017  . Odynophagia 12/03/2016  . Screening-pulmonary TB 12/03/2016  . Routine health maintenance 08/28/2016  . Anxiety  08/28/2016  . Pain in joint of right shoulder 04/25/2016  . Right hand pain 04/16/2016  . Right cervical radiculopathy 03/12/2016  . Primary osteoarthritis of right knee 02/19/2015  . Essential hypertension 02/19/2015  . Left-sided low back pain without sciatica 01/04/2015  . Polyuria 01/03/2015  . Left knee pain 10/31/2014  . Back pain 09/20/2014  . Pain of right breast 06/26/2014  . Melasma 05/24/2014  . Insomnia 01/19/2014  . Hx Herpes simplex virus type 1 (HSV-1) dermatitis 07/22/2013  . Trigeminal neuralgia 01/18/2013  . Skin hypopigmentation 09/16/2012  . Blurry vision, bilateral 07/31/2012  . Leg pain 03/10/2012  . Menorrhagia 09/16/2011  . Allergic rhinitis 04/10/2011  . 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 Derangement of medial meniscus of right knee 02/06/2009  . HTN, goal below 140/90 09/27/2008  . VENOUS INSUFFICIENCY, LEGS 07/07/2008  . SLEEP APNEA, OBSTRUCTIVE 03/24/2008  . 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 ARTHRITIS, ACROMIOCLAVICULAR 11/24/2007  . ROTATOR CUFF SPRAIN AND STRAIN 11/24/2007  . MIGRAINE NOS W/O INTRACTABLE MIGRAINE 08/23/2007  . Morbid obesity ([place]) 06/04/2007  . Depression 06/04/2007  . Chest pain 03/25/2007   Past Medical History:  Diagnosis Date  . Anal fissure    history  . Angioedema of lips 03/21/2011   Presumed 2nd to ACE   . Arthritis    feet  . BV (bacterial vaginosis)    recent dx - finish med 12/22/11  . Depression     on meds  . Headache(784.0)   . HSV (herpes simplex virus) infection   . Hypertension   . Insomnia    on meds  . Migraines   . Obesity   . Sleep apnea    occas. uses CPAP    Family History  Problem Relation Age of Onset  . Diabetes Mother   . Heart failure Mother   . Hypertension Mother   . Stroke Mother   . Diabetes Father   . Diabetes Sister   . Cancer Paternal Aunt        cevical  . Cancer Paternal Aunt        cervical  . Heart attack Brother 33    Past Surgical  History:  Procedure Laterality Date  . CESAREAN SECTION     x 3  . CHOLECYSTECTOMY    . ENDOMETRIAL ABLATION    . KNEE SURGERY  oct 2013   minesctomy and debridement  . NECK SURGERY  2001   cervical fusion C4/5  . TUBAL LIGATION  1992   Social History   Occupational History  . Not on file.   Social History Main Topics  . Smoking status: Never Smoker  . Smokeless tobacco: Never Used  . Alcohol use No     Comment: socially  . Drug use: No  .  Sexual activity: Not Currently    Birth control/ protection: Surgical     Comment: tubaligation

## 2017-07-07 ENCOUNTER — Ambulatory Visit: Payer: Medicare Other | Admitting: *Deleted

## 2017-07-08 DIAGNOSIS — G4733 Obstructive sleep apnea (adult) (pediatric): Secondary | ICD-10-CM | POA: Diagnosis not present

## 2017-07-08 DIAGNOSIS — Z903 Acquired absence of stomach [part of]: Secondary | ICD-10-CM | POA: Diagnosis not present

## 2017-07-08 DIAGNOSIS — I10 Essential (primary) hypertension: Secondary | ICD-10-CM | POA: Diagnosis not present

## 2017-07-08 DIAGNOSIS — K909 Intestinal malabsorption, unspecified: Secondary | ICD-10-CM | POA: Diagnosis not present

## 2017-07-08 DIAGNOSIS — K912 Postsurgical malabsorption, not elsewhere classified: Secondary | ICD-10-CM | POA: Diagnosis not present

## 2017-07-14 ENCOUNTER — Encounter: Payer: Self-pay | Admitting: Student

## 2017-07-14 ENCOUNTER — Other Ambulatory Visit: Payer: Self-pay | Admitting: *Deleted

## 2017-07-14 ENCOUNTER — Ambulatory Visit (INDEPENDENT_AMBULATORY_CARE_PROVIDER_SITE_OTHER): Payer: Medicare Other | Admitting: Student

## 2017-07-14 VITALS — BP 110/64 | HR 80 | Temp 98.5°F | Ht 61.0 in | Wt 206.6 lb

## 2017-07-14 DIAGNOSIS — L811 Chloasma: Secondary | ICD-10-CM

## 2017-07-14 DIAGNOSIS — G4700 Insomnia, unspecified: Secondary | ICD-10-CM

## 2017-07-14 DIAGNOSIS — Z0000 Encounter for general adult medical examination without abnormal findings: Secondary | ICD-10-CM | POA: Diagnosis not present

## 2017-07-14 MED ORDER — ALBUTEROL SULFATE HFA 108 (90 BASE) MCG/ACT IN AERS: INHALATION_SPRAY | RESPIRATORY_TRACT | 0 refills | Status: DC

## 2017-07-14 NOTE — Patient Instructions (Addendum)
It was great seeing you today! We have addressed the following issues today 1. Sleep issue: Continue the medications. I can come back and see [place] in 2-3 months 2.   Skin rash: We will have sent a referral to dermatologist. Someone will get in touch with you with the next couple of weeks. 3.   Screening for cervical cancer: Please call and schedule for your Pap smear as soon as possible  If we did any lab work today, and the results require attention, either me or my nurse will get in touch with you. If everything is normal, you will get a letter in mail and a message via . If you don't hear from [place] in two weeks, please give [place] a call. Otherwise, we look forward to seeing you again at your next visit. If you have any questions or concerns before then, please call the clinic at [phone].  Please bring all your medications to every doctors visit  Sign up for My Chart to have easy access to your labs results, and communication with your Primary care physician.    Please check-out at the front desk before leaving the clinic.    Take Care,   Dr. Cyndia Skeeters

## 2017-07-14 NOTE — Progress Notes (Signed)
Subjective:    Jessica [name] is a 56 y.o. old female here for follow-up on insomnia. She would like to discuss about skin rash as well.   HPI Insomnia: she was seen in clinic about 2 weeks ago. She was started on Zoloft and trazodone. She reports tolerating these medications. She states that her insomnia has improved significantly. She reports having good sleep and significant improvement in daytime fatigue and sleepiness. She will like to keep Zoloft and trazodone as they are now.   Skin rash: Patient reports having a skin rash on her face. This gets worse during the summer season. She reports burning sensation when she goes out in the sun. Reports using sunscreen. She also tried over-the-counter cream to bring the color back but it made it worse. She says she had this in the past and was referred to dermatologist about 2 years ago. She was given a compounded cream that she used at that time. Her skin rash resolved with that.   High risk HPV: Patient had a Pap smear about a year ago that was positive for high-risk HPV. She is aware of these results. She says she would call the [facility] to schedule for her next Pap smear.    PMH/Problem List: has Morbid obesity ([place]); Depression; SLEEP APNEA, OBSTRUCTIVE; MIGRAINE NOS W/O INTRACTABLE MIGRAINE; HTN, goal below 140/90; VENOUS INSUFFICIENCY, LEGS; ARTHRITIS, ACROMIOCLAVICULAR; Derangement of medial meniscus of right knee; Arthritis of great toe at metatarsophalangeal joint; Chest pain; ROTATOR CUFF SPRAIN AND STRAIN; CONGENITAL PES PLANUS; Allergic rhinitis; Menorrhagia; Leg pain; Blurry vision, bilateral; Skin hypopigmentation; Trigeminal neuralgia; Hx Herpes simplex virus type 1 (HSV-1) dermatitis; Insomnia; Melasma; Pain of right breast; Back pain; Left knee pain; Polyuria; Left-sided low back pain without sciatica; Primary osteoarthritis of right knee; Essential hypertension; Right cervical radiculopathy; Right hand pain; Pain in joint of right  shoulder; Routine health maintenance; Anxiety; Odynophagia; Screening-pulmonary TB; Right knee pain; Fall; and Acute pain of left shoulder on her problem list.   has a past medical history of Anal fissure; Angioedema of lips (03/21/2011); Arthritis; BV (bacterial vaginosis); Depression; Headache(784.0); HSV (herpes simplex virus) infection; Hypertension; Insomnia; Migraines; Obesity; and Sleep apnea.  FH:  Family History  Problem Relation Age of Onset  . Diabetes Mother   . Heart failure Mother   . Hypertension Mother   . Stroke Mother   . Diabetes Father   . Diabetes Sister   . Cancer Paternal Aunt        cevical  . Cancer Paternal Aunt        cervical  . Heart attack Brother 23    [place] Social History  Substance Use Topics  . Smoking status: Never Smoker  . Smokeless tobacco: Never Used  . Alcohol use No     Comment: socially    Review of Systems Review of systems negative except for pertinent positives and negatives in history of present illness above.     Objective:     Vitals:   07/14/17 0840  BP: 110/64  Pulse: 80  Temp: 98.5 F (36.9 C)  TempSrc: Oral  SpO2: 97%  Weight: 206 lb 9.6 oz (93.7 kg)  Height: 5\' 1"  (1.549 m)    Physical Exam GEN: appears well, no apparent distress. Head: normocephalic and atraumatic  CVS: RRR, nl S1&S2, no murmurs, no edema RESP:  good air movement bilaterally, CTAB MSK: no focal tenderness or notable swelling SKIN: Scattered areas of hypopigmentation over her forehead NEURO: alert and oiented appropriately, no gross defecits  PSYCH: Neatly groomed, appropriately dressed. Maintains good eye contact and is cooperative and attentive. Speech is normal volume and rate. Mood is good with congruent affect. Thought process is logical and goal-directed. No suicidal or homicidal ideation. Does not appear to be responding to any internal stimuli. Appears to maintain train of thought and concentrate on questions.   Assessment and Plan:    Insomnia Reports significant improvement with Zoloft and trazodone. We will continue Zoloft and trazodone. Follow-up in 2-3 months or sooner as needed  Melasma Has history of this in the past. She was treated with combination of topical therapies by her dermatologist about 2 years ago. Will refer back to her dermatologist. Recommended consistent use of sunscreen as well.   Routine health maintenance Had Pap smear at [facility] that was positive for high-risk HPV. She will schedule for another Pap smear.   Orders Placed This Encounter  Procedures  . Ambulatory referral to Dermatology   Return in about 2 months (around 09/14/2017) for Insomnia.  Mercy Riding, MD 07/14/17 Pager: [phone]

## 2017-07-14 NOTE — Assessment & Plan Note (Signed)
Had Pap smear at [facility] that was positive for high-risk HPV. She will schedule for another Pap smear.

## 2017-07-14 NOTE — Assessment & Plan Note (Signed)
Has history of this in the past. She was treated with combination of topical therapies by her dermatologist about 2 years ago. Will refer back to her dermatologist. Recommended consistent use of sunscreen as well.

## 2017-07-14 NOTE — Assessment & Plan Note (Signed)
Reports significant improvement with Zoloft and trazodone. We will continue Zoloft and trazodone. Follow-up in 2-3 months or sooner as needed

## 2017-07-15 ENCOUNTER — Other Ambulatory Visit: Payer: Self-pay | Admitting: Student

## 2017-07-21 ENCOUNTER — Ambulatory Visit: Payer: Medicare Other | Admitting: *Deleted

## 2017-07-22 DIAGNOSIS — L811 Chloasma: Secondary | ICD-10-CM | POA: Diagnosis not present

## 2017-07-22 DIAGNOSIS — L709 Acne, unspecified: Secondary | ICD-10-CM | POA: Diagnosis not present

## 2017-07-23 ENCOUNTER — Other Ambulatory Visit: Payer: Self-pay | Admitting: Student

## 2017-07-27 ENCOUNTER — Ambulatory Visit (INDEPENDENT_AMBULATORY_CARE_PROVIDER_SITE_OTHER): Payer: Medicare Other | Admitting: Orthopedic Surgery

## 2017-07-31 ENCOUNTER — Other Ambulatory Visit: Payer: Self-pay | Admitting: Student

## 2017-07-31 DIAGNOSIS — M25511 Pain in right shoulder: Secondary | ICD-10-CM

## 2017-08-04 ENCOUNTER — Other Ambulatory Visit: Payer: Self-pay | Admitting: Student

## 2017-08-04 ENCOUNTER — Ambulatory Visit (INDEPENDENT_AMBULATORY_CARE_PROVIDER_SITE_OTHER): Payer: Medicare Other | Admitting: Orthopedic Surgery

## 2017-08-04 DIAGNOSIS — Z1231 Encounter for screening mammogram for malignant neoplasm of breast: Secondary | ICD-10-CM

## 2017-08-11 ENCOUNTER — Ambulatory Visit (INDEPENDENT_AMBULATORY_CARE_PROVIDER_SITE_OTHER): Payer: Medicare Other | Admitting: *Deleted

## 2017-08-11 ENCOUNTER — Encounter: Payer: Self-pay | Admitting: *Deleted

## 2017-08-11 VITALS — BP 108/66 | HR 56 | Temp 98.5°F | Ht 61.0 in | Wt 203.2 lb

## 2017-08-11 DIAGNOSIS — Z0000 Encounter for general adult medical examination without abnormal findings: Secondary | ICD-10-CM

## 2017-08-11 DIAGNOSIS — G4733 Obstructive sleep apnea (adult) (pediatric): Secondary | ICD-10-CM | POA: Diagnosis not present

## 2017-08-11 NOTE — Progress Notes (Addendum)
Subjective:   Jessica [name] is a 56 y.o. female who presents for Medicare Annual (Subsequent) preventive examination.  Cardiac Risk Factors include: family history of premature cardiovascular disease;hypertension;obesity (BMI >30kg/m2)     Objective:     Vitals: BP 108/66 (BP Location: Left Arm, Patient Position: Sitting, Cuff Size: Large)   Pulse (!) 56 Comment: Manual  Temp 98.5 F (36.9 C) (Oral)   Ht 5\' 1"  (1.549 m)   Wt 203 lb 3.2 oz (92.2 kg)   BMI 38.39 kg/m   Body mass index is 38.39 kg/m.   Tobacco History  Smoking Status  . Never Smoker  Smokeless Tobacco  . Never Used     Counseling given: Yes Patient has never smoked and has no plans to start.   Past Medical History:  Diagnosis Date  . Anal fissure    history  . Angioedema of lips 03/21/2011   Presumed 2nd to ACE   . Arthritis    feet  . BV (bacterial vaginosis)    recent dx - finish med 12/22/11  . Depression     on meds  . Headache(784.0)   . HSV (herpes simplex virus) infection   . Hypertension   . Insomnia    on meds  . Migraines   . Obesity   . Sleep apnea    occas. uses CPAP   Past Surgical History:  Procedure Laterality Date  . BARIATRIC SURGERY  02/06/2017  . CESAREAN SECTION     x 3  . CHOLECYSTECTOMY    . ENDOMETRIAL ABLATION    . KNEE SURGERY  oct 2013   minesctomy and debridement  . NECK SURGERY  2001   cervical fusion C4/5  . TUBAL LIGATION  1992   Family History  Problem Relation Age of Onset  . Diabetes Mother   . Heart failure Mother   . Hypertension Mother   . Stroke Mother   . Diabetes Father   . Alcohol abuse Father   . Cirrhosis Father   . Diabetes Sister   . Hypertension Sister   . Cancer Paternal Aunt        cevical  . Cancer Paternal Aunt        cervical  . Drug abuse Brother   . Heart attack Brother 53  . Asthma Daughter   . Depression Daughter   . Anxiety disorder Daughter   . Asthma Daughter    History  Sexual Activity  . Sexual  activity: Not Currently  . Birth control/ protection: Surgical    Comment: tubaligation    Outpatient Encounter Prescriptions as of 08/11/2017  Medication Sig  . albuterol (VENTOLIN HFA) 108 (90 Base) MCG/ACT inhaler INHALE 2 PUFFS INTO THE LUNGS EVERY 6 (SIX) HOURS AS NEEDED FOR WHEEZING OR SHORTNESS OF BREATH.  Marland Kitchen Biotin 1000 MCG CHEW Chew 1 tablet by mouth daily.  . cetirizine (ZYRTEC) 10 MG tablet TAKE 1 TABLET (10 MG) BY MOUTH DAILY AS NEEDED  . ferrous sulfate 325 (65 FE) MG EC tablet Take 325 mg by mouth every morning.  . meloxicam (MOBIC) 15 MG tablet TAKE 1 TABLET BY MOUTH EVERY DAY  . montelukast (SINGULAIR) 10 MG tablet TAKE 1 TABLET BY MOUTH AT BEDTIME.  . Multiple Vitamin (MULTIVITAMIN WITH MINERALS) TABS tablet Take 1 tablet by mouth daily.  Marland Kitchen omeprazole (PRILOSEC) 20 MG capsule Take 20 mg by mouth 2 (two) times daily before a meal.  . polyethylene glycol powder (GLYCOLAX/MIRALAX) powder Take 17 g by mouth  daily as needed for moderate constipation or severe constipation.  . sertraline (ZOLOFT) 50 MG tablet Take 1 tablet (50 mg total) by mouth daily.  Marland Kitchen tiZANidine (ZANAFLEX) 4 MG capsule Take 1 capsule (4 mg total) by mouth 3 (three) times daily as needed for muscle spasms (and pain).  . topiramate (TOPAMAX) 50 MG tablet TAKE 1 TABLET (50 MG TOTAL) BY MOUTH 2 (TWO) TIMES DAILY.  . traZODone (DESYREL) 50 MG tablet Take 0.5-1 tablets (25-50 mg total) by mouth at bedtime as needed for sleep.  . valACYclovir (VALTREX) 500 MG tablet Take 1 tablet (500 mg total) by mouth 2 (two) times daily.  . vitamin C (ASCORBIC ACID) 500 MG tablet Take 500 mg by mouth daily.  Marland Kitchen VITAMIN D, CHOLECALCIFEROL, PO Take by mouth.  . [DISCONTINUED] predniSONE (DELTASONE) 10 MG tablet 6 tablets for 2 days, then 5 for 2 days, then 4 for 2 days, then 3  for 2 days, then 2 for 2 days, then 1 tablet for 2 days (Patient not taking: Reported on 08/11/2017)   No facility-administered encounter medications on file as  of 08/11/2017.     Activities of Daily Living In your present state of health, do you have any difficulty performing the following activities: 08/11/2017  Hearing? Y  Vision? Y  Difficulty concentrating or making decisions? Y  Walking or climbing stairs? Y  Dressing or bathing? Y  Doing errands, shopping? Y  Preparing Food and eating ? N  Using the Toilet? N  In the past six months, have you accidently leaked urine? N  Do you have problems with loss of bowel control? N  Managing your Medications? N  Managing your Finances? N  Housekeeping or managing your Housekeeping? N  Some recent data might be hidden   Home Safety:  My home has a working smoke alarm:  Yes X 4           My home throw rugs have been fastened down to the floor or removed:  Removed I have a non-slip surface or non-slip mats in the bathtub and shower:  No, discussed installing non-slip decals in tub and grab bars as pt has fallen several times getting out of tub.        All my home's stairs have handrails, including any outdoor stairs  One level home with 1/2 outside step without handrail.           My home's floors, stairs and hallways are free from clutter, wires and cords:  Yes     I have animals in my home  Yes, two yorkies I wear seatbelts consistently:  Yes    Patient Care Team: Mercy Riding, MD as PCP - General (Family Medicine) Osborne Oman, MD as Attending Physician (Obstetrics and Gynecology) Sabino Snipes., MD (Surgery) Newt Minion, MD as Consulting Physician (Orthopedic Surgery)    Assessment:     Exercise Activities and Dietary recommendations Current Exercise Habits: [facility]ured exercise [facility] ([facility] [employer]), Type of exercise: walking (swimming), Time (Minutes): 60 (walking 30 min and swimming 60 min), Frequency (Times/Week): 3, Weekly Exercise (Minutes/Week): 180, Intensity: Moderate, Exercise limited by: orthopedic condition(s);None identified (shoulders, right knee and  back)  Goals    . Maintain current level of physical activity          Swimming and walking 3 days per week.    . Weight (lb) < 175 lb (79.4 kg) (pt-stated)      Fall Risk Fall Risk  08/11/2017 07/14/2017 06/29/2017 09/11/2016 08/28/2016  Falls in the past year? Yes No No No No  Number falls in past yr: 2 or more - - - -  Injury with Fall? Yes - - - -  Comment Injured back - - - -  Risk Factor Category  High Fall Risk - - - -  Risk for fall due to : History of fall(s);Impaired mobility;Impaired balance/gait - - - -  Risk for fall due to: Comment falls out of bathtub - - - -  Follow up Falls evaluation completed;Education provided;Falls prevention discussed - - - -   Depression Screen PHQ 2/9 Scores 08/11/2017 07/14/2017 06/29/2017 04/20/2017  PHQ - 2 Score 1 0 0 0  PHQ- 9 Score - - - -    TUG Test:  Done in 8 seconds. Patient used both hands to push out of chair and to sit back down. Falls prevention discussed in detail and literature given. PCP notified of High Fall Risk status.  Cognitive Function: Mini-Cog  Passed with score 4/5    Immunization History  Administered Date(s) Administered  . Hepatitis A 12/06/2009, 06/21/2010  . Hepatitis B 12/06/2009, 02/06/2010, 06/21/2010  . Influenza Split 12/15/2011, 12/07/2012  . Influenza Whole 09/27/2008, 12/06/2009  . Influenza,inj,Quad PF,36+ Mos 10/12/2013, 09/11/2016  . PPD Test 02/05/2011, 02/16/2012, 12/17/2016  . Td 12/06/2009   Screening Tests Health Maintenance  Topic Date Due  . INFLUENZA VACCINE  07/29/2017  . MAMMOGRAM  05/08/2018  . PAP SMEAR  08/27/2019  . TETANUS/TDAP  12/07/2019  . COLONOSCOPY  11/12/2023  . Hepatitis C Screening  Completed  . HIV Screening  Completed   Discussed Shingrix Discussed receiving flu vaccine yearly starting 9/1.  Patient has appt for mammo on 08/18/2017 and appt with OB/GYN for repeat pap on 08/27/2017     Plan:     Discussed Shingrix Discussed receiving flu vaccine yearly  starting 9/1.  Patient has appt for mammo on 08/18/2017 and appt with OB/GYN for repeat pap on 08/27/2017  Requesting refill on miralax. Sent request to PCP in separate message  I have personally reviewed and noted the following in the patient's chart:   . Medical and social history . Use of alcohol, tobacco or illicit drugs  . Current medications and supplements . Functional ability and status . Nutritional status . Physical activity . Advanced directives . List of other physicians . Hospitalizations, surgeries, and ER visits in previous 12 months . Vitals . Screenings to include cognitive, depression, and falls . Referrals and appointments  In addition, I have reviewed and discussed with patient certain preventive protocols, quality metrics, and best practice recommendations. A written personalized care plan for preventive services as well as general preventive health recommendations were provided to patient.     Velora Heckler, RN  08/11/2017  I have reviewed this visit and discussed with Howell Rucks, RN, BSN, and agree with her documentation  Wendee Beavers PGY-3 Pager [phone] 08/12/17  6:27 PM

## 2017-08-11 NOTE — Patient Instructions (Addendum)
Jessica [name],  Thank you for taking time to come for yourMedicare Wellness Visit. I appreciate your ongoing commitment to your health goals. Please review the following plan we discussed and let me know if I can assist you in the future.   These are the goals we discussed:  Goals    . Maintain current level of physical activity          Swimming and walking 3 days per week.    . Weight (lb) < 175 lb (79.4 kg) (pt-stated)       Fall Prevention in the Home Falls can cause injuries. They can happen to people of all ages. There are many things you can do to make your home safe and to help prevent falls. What can I do on the outside of my home?  Regularly fix the edges of walkways and driveways and fix any cracks.  Remove anything that might make you trip as you walk through a door, such as a raised step or threshold.  Trim any bushes or trees on the path to your home.  Use bright outdoor lighting.  Clear any walking paths of anything that might make someone trip, such as rocks or tools.  Regularly check to see if handrails are loose or broken. Make sure that both sides of any steps have handrails.  Any raised decks and porches should have guardrails on the edges.  Have any leaves, snow, or ice cleared regularly.  Use sand or salt on walking paths during winter.  Clean up any spills in your garage right away. This includes oil or grease spills. What can I do in the bathroom?  Use night lights.  Install grab bars by the toilet and in the tub and shower. Do not use towel bars as grab bars.  Use non-skid mats or decals in the tub or shower.  If you need to sit down in the shower, use a plastic, non-slip stool.  Keep the floor dry. Clean up any water that spills on the floor as soon as it happens.  Remove soap buildup in the tub or shower regularly.  Attach bath mats securely with double-sided non-slip rug tape.  Do not have throw rugs and other things on the floor that  can make you trip. What can I do in the bedroom?  Use night lights.  Make sure that you have a light by your bed that is easy to reach.  Do not use any sheets or blankets that are too big for your bed. They should not hang down onto the floor.  Have a firm chair that has side arms. You can use this for support while you get dressed.  Do not have throw rugs and other things on the floor that can make you trip. What can I do in the kitchen?  Clean up any spills right away.  Avoid walking on wet floors.  Keep items that you use a lot in easy-to-reach places.  If you need to reach something above you, use a strong step stool that has a grab bar.  Keep electrical cords out of the way.  Do not use floor polish or wax that makes floors slippery. If you must use wax, use non-skid floor wax.  Do not have throw rugs and other things on the floor that can make you trip. What can I do with my stairs?  Do not leave any items on the stairs.  Make sure that there are handrails on both sides of  the stairs and use them. Fix handrails that are broken or loose. Make sure that handrails are as long as the stairways.  Check any carpeting to make sure that it is firmly attached to the stairs. Fix any carpet that is loose or worn.  Avoid having throw rugs at the top or bottom of the stairs. If you do have throw rugs, attach them to the floor with carpet tape.  Make sure that you have a light switch at the top of the stairs and the bottom of the stairs. If you do not have them, ask someone to add them for you. What else can I do to help prevent falls?  Wear shoes that: ? Do not have high heels. ? Have rubber bottoms. ? Are comfortable and fit you well. ? Are closed at the toe. Do not wear sandals.  If you use a stepladder: ? Make sure that it is fully opened. Do not climb a closed stepladder. ? Make sure that both sides of the stepladder are locked into place. ? Ask someone to hold it for  you, if possible.  Clearly mark and make sure that you can see: ? Any grab bars or handrails. ? First and last steps. ? Where the edge of each step is.  Use tools that help you move around (mobility aids) if they are needed. These include: ? Canes. ? Walkers. ? Scooters. ? Crutches.  Turn on the lights when you go into a dark area. Replace any light bulbs as soon as they burn out.  Set up your furniture so you have a clear path. Avoid moving your furniture around.  If any of your floors are uneven, fix them.  If there are any pets around you, be aware of where they are.  Review your medicines with your doctor. Some medicines can make you feel dizzy. This can increase your chance of falling. Ask your doctor what other things that you can do to help prevent falls. This information is not intended to replace advice given to you by your health care provider. Make sure you discuss any questions you have with your health care provider. Document Released: 10/11/2009 Document Revised: 05/22/2016 Document Reviewed: 01/19/2015 Elsevier Interactive Patient Education  2018 [employer] [place] Maintenance, Female Adopting a healthy lifestyle and getting preventive care can go a long way to promote health and wellness. Talk with your health care provider about what schedule of regular examinations is right for you. This is a good chance for you to check in with your provider about disease prevention and staying healthy. In between checkups, there are plenty of things you can do on your own. Experts have done a lot of research about which lifestyle changes and preventive measures are most likely to keep you healthy. Ask your health care provider for more information. Weight and diet Eat a healthy diet  Be sure to include plenty of vegetables, fruits, low-fat dairy products, and lean protein.  Do not eat a lot of foods high in solid fats, added sugars, or salt.  Get regular exercise. This is  one of the most important things you can do for your health. ? Most adults should exercise for at least 150 minutes each week. The exercise should increase your heart rate and make you sweat (moderate-intensity exercise). ? Most adults should also do strengthening exercises at least twice a week. This is in addition to the moderate-intensity exercise.  Maintain a healthy weight  Body mass index (BMI) is  a measurement that can be used to identify possible weight problems. It estimates body fat based on height and weight. Your health care provider can help determine your BMI and help you achieve or maintain a healthy weight.  For females 56 years of age and older: ? A BMI below 18.5 is considered underweight. ? A BMI of 18.5 to 24.9 is normal. ? A BMI of 25 to 29.9 is considered overweight. ? A BMI of 30 and above is considered obese.  Watch levels of cholesterol and blood lipids  You should start having your blood tested for lipids and cholesterol at 54 years of age, then have this test every 5 years.  You may need to have your cholesterol levels checked more often if: ? Your lipid or cholesterol levels are high. ? You are older than 54 years of age. ? You are at high risk for heart disease.  Cancer screening Lung Cancer  Lung cancer screening is recommended for adults 79-53 years old who are at high risk for lung cancer because of a history of smoking.  A yearly low-dose CT scan of the lungs is recommended for people who: ? Currently smoke. ? Have quit within the past 15 years. ? Have at least a 30-pack-year history of smoking. A pack year is smoking an average of one pack of cigarettes a day for 1 year.  Yearly screening should continue until it has been 15 years since you quit.  Yearly screening should stop if you develop a health problem that would prevent you from having lung cancer treatment.  Breast Cancer  Practice breast self-awareness. This means understanding how your  breasts normally appear and feel.  It also means doing regular breast self-exams. Let your health care provider know about any changes, no matter how small.  If you are in your 20s or 30s, you should have a clinical breast exam (CBE) by a health care provider every 1-3 years as part of a regular health exam.  If you are 90 or older, have a CBE every year. Also consider having a breast X-ray (mammogram) every year.  If you have a family history of breast cancer, talk to your health care provider about genetic screening.  If you are at high risk for breast cancer, talk to your health care provider about having an MRI and a mammogram every year.  Breast cancer gene (BRCA) assessment is recommended for women who have family members with BRCA-related cancers. BRCA-related cancers include: ? Breast. ? Ovarian. ? Tubal. ? Peritoneal cancers.  Results of the assessment will determine the need for genetic counseling and BRCA1 and BRCA2 testing.  Cervical Cancer Your health care provider may recommend that you be screened regularly for cancer of the pelvic organs (ovaries, uterus, and vagina). This screening involves a pelvic examination, including checking for microscopic changes to the surface of your cervix (Pap test). You may be encouraged to have this screening done every 3 years, beginning at age 65.  For women ages 31-65, health care providers may recommend pelvic exams and Pap testing every 3 years, or they may recommend the Pap and pelvic exam, combined with testing for human papilloma virus (HPV), every 5 years. Some types of HPV increase your risk of cervical cancer. Testing for HPV may also be done on women of any age with unclear Pap test results.  Other health care providers may not recommend any screening for nonpregnant women who are considered low risk for pelvic cancer and who do  not have symptoms. Ask your health care provider if a screening pelvic exam is right for you.  If you  have had past treatment for cervical cancer or a condition that could lead to cancer, you need Pap tests and screening for cancer for at least 20 years after your treatment. If Pap tests have been discontinued, your risk factors (such as having a new sexual partner) need to be reassessed to determine if screening should resume. Some women have medical problems that increase the chance of getting cervical cancer. In these cases, your health care provider may recommend more frequent screening and Pap tests.  Colorectal Cancer  This type of cancer can be detected and often prevented.  Routine colorectal cancer screening usually begins at 54 years of age and continues through 54 years of age.  Your health care provider may recommend screening at an earlier age if you have risk factors for colon cancer.  Your health care provider may also recommend using home test kits to check for hidden blood in the stool.  A small camera at the end of a tube can be used to examine your colon directly (sigmoidoscopy or colonoscopy). This is done to check for the earliest forms of colorectal cancer.  Routine screening usually begins at age 74.  Direct examination of the colon should be repeated every 5-10 years through 54 years of age. However, you may need to be screened more often if early forms of precancerous polyps or small growths are found.  Skin Cancer  Check your skin from head to toe regularly.  Tell your health care provider about any new moles or changes in moles, especially if there is a change in a mole's shape or color.  Also tell your health care provider if you have a mole that is larger than the size of a pencil eraser.  Always use sunscreen. Apply sunscreen liberally and repeatedly throughout the day.  Protect yourself by wearing long sleeves, pants, a wide-brimmed hat, and sunglasses whenever you are outside.  Heart disease, diabetes, and high blood pressure  High blood pressure causes  heart disease and increases the risk of stroke. High blood pressure is more likely to develop in: ? People who have blood pressure in the high end of the normal range (130-139/85-89 mm Hg). ? People who are overweight or obese. ? People who are African American.  If you are 67-69 years of age, have your blood pressure checked every 3-5 years. If you are 77 years of age or older, have your blood pressure checked every year. You should have your blood pressure measured twice-once when you are at a hospital or clinic, and once when you are not at a hospital or clinic. Record the average of the two measurements. To check your blood pressure when you are not at a hospital or clinic, you can use: ? An automated blood pressure machine at a pharmacy. ? A home blood pressure monitor.  If you are between 23 years and 41 years old, ask your health care provider if you should take aspirin to prevent strokes.  Have regular diabetes screenings. This involves taking a blood sample to check your fasting blood sugar level. ? If you are at a normal weight and have a low risk for diabetes, have this test once every three years after 54 years of age. ? If you are overweight and have a high risk for diabetes, consider being tested at a younger age or more often. Preventing infection Hepatitis B  If you have a higher risk for hepatitis B, you should be screened for this virus. You are considered at high risk for hepatitis B if: ? You were born in a country where hepatitis B is common. Ask your health care provider which countries are considered high risk. ? Your parents were born in a high-risk country, and you have not been immunized against hepatitis B (hepatitis B vaccine). ? You have HIV or AIDS. ? You use needles to inject street drugs. ? You live with someone who has hepatitis B. ? You have had sex with someone who has hepatitis B. ? You get hemodialysis treatment. ? You take certain medicines for  conditions, including cancer, organ transplantation, and autoimmune conditions.  Hepatitis C  Blood testing is recommended for: ? Everyone born from [birth year] through 1963. ? Anyone with known risk factors for hepatitis C.  Sexually transmitted infections (STIs)  You should be screened for sexually transmitted infections (STIs) including gonorrhea and chlamydia if: ? You are sexually active and are younger than 54 years of age. ? You are older than 54 years of age and your health care provider tells you that you are at risk for this type of infection. ? Your sexual activity has changed since you were last screened and you are at an increased risk for chlamydia or gonorrhea. Ask your health care provider if you are at risk.  If you do not have HIV, but are at risk, it may be recommended that you take a prescription medicine daily to prevent HIV infection. This is called pre-exposure prophylaxis (PrEP). You are considered at risk if: ? You are sexually active and do not regularly use condoms or know the HIV status of your partner(s). ? You take drugs by injection. ? You are sexually active with a partner who has HIV.  Talk with your health care provider about whether you are at high risk of being infected with HIV. If you choose to begin PrEP, you should first be tested for HIV. You should then be tested every 3 months for as long as you are taking PrEP. Pregnancy  If you are premenopausal and you may become pregnant, ask your health care provider about preconception counseling.  If you may become pregnant, take 400 to 800 micrograms (mcg) of folic acid every day.  If you want to prevent pregnancy, talk to your health care provider about birth control (contraception). Osteoporosis and menopause  Osteoporosis is a disease in which the bones lose minerals and strength with aging. This can result in serious bone fractures. Your risk for osteoporosis can be identified using a bone density  scan.  If you are 58 years of age or older, or if you are at risk for osteoporosis and fractures, ask your health care provider if you should be screened.  Ask your health care provider whether you should take a calcium or vitamin D supplement to lower your risk for osteoporosis.  Menopause may have certain physical symptoms and risks.  Hormone replacement therapy may reduce some of these symptoms and risks. Talk to your health care provider about whether hormone replacement therapy is right for you. Follow these instructions at home:  Schedule regular health, dental, and eye exams.  Stay current with your immunizations.  Do not use any tobacco products including cigarettes, chewing tobacco, or electronic cigarettes.  If you are pregnant, do not drink alcohol.  If you are breastfeeding, limit how much and how often you drink alcohol.  Limit alcohol  intake to no more than 1 drink per day for nonpregnant women. One drink equals 12 ounces of beer, 5 ounces of wine, or 1 ounces of hard liquor.  Do not use street drugs.  Do not share needles.  Ask your health care provider for help if you need support or information about quitting drugs.  Tell your health care provider if you often feel depressed.  Tell your health care provider if you have ever been abused or do not feel safe at home. This information is not intended to replace advice given to you by your health care provider. Make sure you discuss any questions you have with your health care provider. Document Released: 06/30/2011 Document Revised: 05/22/2016 Document Reviewed: 09/18/2015 Elsevier Interactive Patient Education  [employer].

## 2017-08-12 ENCOUNTER — Other Ambulatory Visit: Payer: Self-pay | Admitting: *Deleted

## 2017-08-12 DIAGNOSIS — R110 Nausea: Secondary | ICD-10-CM | POA: Diagnosis not present

## 2017-08-12 DIAGNOSIS — K5900 Constipation, unspecified: Secondary | ICD-10-CM | POA: Diagnosis not present

## 2017-08-12 DIAGNOSIS — Z9989 Dependence on other enabling machines and devices: Secondary | ICD-10-CM | POA: Diagnosis not present

## 2017-08-12 DIAGNOSIS — R160 Hepatomegaly, not elsewhere classified: Secondary | ICD-10-CM | POA: Diagnosis not present

## 2017-08-12 DIAGNOSIS — G4730 Sleep apnea, unspecified: Secondary | ICD-10-CM | POA: Diagnosis not present

## 2017-08-12 DIAGNOSIS — Z9049 Acquired absence of other specified parts of digestive tract: Secondary | ICD-10-CM | POA: Diagnosis not present

## 2017-08-12 DIAGNOSIS — I10 Essential (primary) hypertension: Secondary | ICD-10-CM | POA: Diagnosis not present

## 2017-08-12 DIAGNOSIS — Z79899 Other long term (current) drug therapy: Secondary | ICD-10-CM | POA: Diagnosis not present

## 2017-08-12 DIAGNOSIS — N200 Calculus of kidney: Secondary | ICD-10-CM | POA: Diagnosis not present

## 2017-08-12 DIAGNOSIS — Z9884 Bariatric surgery status: Secondary | ICD-10-CM | POA: Diagnosis not present

## 2017-08-12 DIAGNOSIS — Z91040 Latex allergy status: Secondary | ICD-10-CM | POA: Diagnosis not present

## 2017-08-12 DIAGNOSIS — Z888 Allergy status to other drugs, medicaments and biological substances status: Secondary | ICD-10-CM | POA: Diagnosis not present

## 2017-08-12 DIAGNOSIS — K5730 Diverticulosis of large intestine without perforation or abscess without bleeding: Secondary | ICD-10-CM | POA: Diagnosis not present

## 2017-08-12 DIAGNOSIS — E278 Other specified disorders of adrenal gland: Secondary | ICD-10-CM | POA: Diagnosis not present

## 2017-08-12 DIAGNOSIS — R1013 Epigastric pain: Secondary | ICD-10-CM | POA: Diagnosis not present

## 2017-08-12 MED ORDER — POLYETHYLENE GLYCOL 3350 17 GM/SCOOP PO POWD
17.0000 g | Freq: Every day | ORAL | 1 refills | Status: DC | PRN
Start: 2017-08-12 — End: 2017-10-25

## 2017-08-13 DIAGNOSIS — R160 Hepatomegaly, not elsewhere classified: Secondary | ICD-10-CM | POA: Diagnosis not present

## 2017-08-13 DIAGNOSIS — K5730 Diverticulosis of large intestine without perforation or abscess without bleeding: Secondary | ICD-10-CM | POA: Diagnosis not present

## 2017-08-13 DIAGNOSIS — E278 Other specified disorders of adrenal gland: Secondary | ICD-10-CM | POA: Diagnosis not present

## 2017-08-13 DIAGNOSIS — N200 Calculus of kidney: Secondary | ICD-10-CM | POA: Diagnosis not present

## 2017-08-18 ENCOUNTER — Ambulatory Visit: Payer: Medicare Other | Admitting: Family Medicine

## 2017-08-18 ENCOUNTER — Ambulatory Visit
Admission: RE | Admit: 2017-08-18 | Discharge: 2017-08-18 | Disposition: A | Discharge status: Home / Self Care | Payer: Medicare Other | Source: Ambulatory Visit | Attending: Family Medicine | Admitting: Family Medicine

## 2017-08-18 DIAGNOSIS — Z1231 Encounter for screening mammogram for malignant neoplasm of breast: Secondary | ICD-10-CM | POA: Diagnosis not present

## 2017-08-20 ENCOUNTER — Emergency Department (HOSPITAL_BASED_OUTPATIENT_CLINIC_OR_DEPARTMENT_OTHER): Payer: Medicare Other

## 2017-08-20 ENCOUNTER — Encounter: Payer: Self-pay | Admitting: Internal Medicine

## 2017-08-20 ENCOUNTER — Encounter (HOSPITAL_BASED_OUTPATIENT_CLINIC_OR_DEPARTMENT_OTHER): Payer: Self-pay | Admitting: *Deleted

## 2017-08-20 ENCOUNTER — Emergency Department (HOSPITAL_BASED_OUTPATIENT_CLINIC_OR_DEPARTMENT_OTHER)
Admission: EM | Admit: 2017-08-20 | Discharge: 2017-08-20 | Disposition: A | Discharge status: Home / Self Care | Payer: Medicare Other | Attending: Emergency Medicine | Admitting: Emergency Medicine

## 2017-08-20 ENCOUNTER — Ambulatory Visit (INDEPENDENT_AMBULATORY_CARE_PROVIDER_SITE_OTHER): Payer: Medicare Other | Admitting: Internal Medicine

## 2017-08-20 VITALS — BP 118/64 | HR 68 | Temp 98.1°F | Wt 203.6 lb

## 2017-08-20 DIAGNOSIS — Z79899 Other long term (current) drug therapy: Secondary | ICD-10-CM | POA: Diagnosis not present

## 2017-08-20 DIAGNOSIS — Z91040 Latex allergy status: Secondary | ICD-10-CM | POA: Diagnosis not present

## 2017-08-20 DIAGNOSIS — R0789 Other chest pain: Secondary | ICD-10-CM | POA: Insufficient documentation

## 2017-08-20 DIAGNOSIS — I10 Essential (primary) hypertension: Secondary | ICD-10-CM | POA: Insufficient documentation

## 2017-08-20 DIAGNOSIS — R05 Cough: Secondary | ICD-10-CM | POA: Diagnosis not present

## 2017-08-20 DIAGNOSIS — J069 Acute upper respiratory infection, unspecified: Secondary | ICD-10-CM | POA: Diagnosis not present

## 2017-08-20 MED ORDER — KETOROLAC TROMETHAMINE 30 MG/ML IJ SOLN
30.0000 mg | Freq: Once | INTRAMUSCULAR | Status: AC
  Administered 2017-08-20: 30 mg via INTRAMUSCULAR
  Filled 2017-08-20: qty 1

## 2017-08-20 MED ORDER — FLUTICASONE PROPIONATE 50 MCG/ACT NA SUSP: 2.0000 | Freq: Every day | NASAL | 6 refills | Status: DC

## 2017-08-20 MED ORDER — GUAIFENESIN-DM 100-10 MG/5ML PO SYRP: 5.0000 mL | ORAL_SOLUTION | ORAL | 0 refills | Status: DC | PRN

## 2017-08-20 MED ORDER — IBUPROFEN 800 MG PO TABS: 800.0000 mg | ORAL_TABLET | Freq: Three times a day (TID) | ORAL | 0 refills | Status: DC

## 2017-08-20 NOTE — ED Triage Notes (Signed)
Pain in her right scapula and right upper quadrant. States she has a lump in her RUQ that is getting bigger. She was seen by her MD this am for cold symptoms but did not mention to her the pain she has been having in her RUQ.

## 2017-08-20 NOTE — Assessment & Plan Note (Signed)
Symptomatic treatment for URI. Less likely acute bacterial sinusitis -given lack of duration, no tenderness of sinus, no fever over the past few days  - Should improve in the next 2-3 days  - guaiFENesin-dextromethorphan (ROBITUSSIN DM) 100-10 MG/5ML syrup; Take 5 mLs by mouth every 4 (four) hours as needed for cough.  Dispense: 118 mL; Refill: 0 - fluticasone (FLONASE) 50 MCG/ACT nasal spray; Place 2 sprays into both nostrils daily.  Dispense: 16 g; Refill: 6 - Ibuprofen as need for muscles soreness from the cough

## 2017-08-20 NOTE — Progress Notes (Signed)
   [facility] Kerrin Mo, MD Phone: [phone]  Reason For Visit: SDA for Cold   # URI  Has been sick for 5 days. Patient has had sore throat. Developed cough and congestion. Patient states that she had a fever of 101 on Monday night. Has had decreased appetite. Patient has had a headache. Muscles of sore from cough. Has taken Alka-Setzer cold and flu. Patient states she feels about the same. Patient states the fevers have subsided.  Nasal discharge: yes  Medications tried: yes  Sick contacts:none   Symptoms Fever: yes  Headache or face pain: yes  Tooth pain: none Sneezing: yes  Muscle aches: sore muscle from coughing  Severe fatigue: yes  Stiff neck: none Shortness of breath: none  Rash:none Sore throat or swollen glands: yes   ROS see HPI Smoking Status noted  Objective: There were no vitals taken for this visit. Gen: NAD, alert, cooperative with exam HEENT: Normal    Neck: No masses palpated. spotty lymphadenopathy    Ears: Tympanic membranes intact, normal light reflex, no erythema, no bulging    Nose: nasal turbinates boggy and erythematous     Throat: moist mucus membranes, post-nasal drip  Cardio: regular rate and rhythm, S1S2 heard, no murmurs appreciated Pulm: clear to auscultation bilaterally, no wheezes, rhonchi or rales Skin: dry, intact, no rashes or lesions   Assessment/Plan: See problem based a/p  Acute upper respiratory infection Symptomatic treatment for URI. Less likely acute bacterial sinusitis -given lack of duration, no tenderness of sinus, no fever over the past few days  - Should improve in the next 2-3 days  - guaiFENesin-dextromethorphan (ROBITUSSIN DM) 100-10 MG/5ML syrup; Take 5 mLs by mouth every 4 (four) hours as needed for cough.  Dispense: 118 mL; Refill: 0 - fluticasone (FLONASE) 50 MCG/ACT nasal spray; Place 2 sprays into both nostrils daily.  Dispense: 16 g; Refill: 6 - Ibuprofen as need for muscles  soreness from the cough

## 2017-08-20 NOTE — Patient Instructions (Signed)
You can use Robitussin for her cough and Flonase to help dry up the mucus in your nose. Unfortunately, you probably having a lot of postnasal drip which is irritating your throat and causing you to cough. Please used ibuprofen every 6-12 hours for your muscle soreness from coughing

## 2017-08-21 NOTE — ED Provider Notes (Signed)
[facility] DEPT [facility] Provider Note   CSN: [id number] Arrival date & time: 08/20/17  2007     History   Chief Complaint No chief complaint on file.   HPI Jessica [name] is a 56 y.o. female.  Patient was seen by her PCP today and diagnosed with URI.  She has had a persistent cough.  She reports developing right lateral chest wall pain from below her right scapula and radiating to under her right breast.  She denies current abdominal pain, nausea, vomiting.  She endorses some mild abdominal discomfort from constipation several days ago, now resolved.  History of gastric sleeve.   The history is provided by the patient. No language interpreter was used.  Chest Pain   This is a new problem. The current episode started 12 to 24 hours ago. The problem occurs constantly. The problem has been gradually worsening. The pain is associated with coughing, movement and breathing. The pain is present in the lateral region. The pain is moderate. The quality of the pain is described as stabbing. The pain radiates to the right shoulder. The symptoms are aggravated by deep breathing. Associated symptoms include cough. Pertinent negatives include no abdominal pain, no back pain, no fever, no nausea and no vomiting.    Past Medical History:  Diagnosis Date  . Anal fissure    history  . Angioedema of lips 03/21/2011   Presumed 2nd to ACE   . Arthritis    feet  . BV (bacterial vaginosis)    recent dx - finish med 12/22/11  . Depression     on meds  . Headache(784.0)   . HSV (herpes simplex virus) infection   . Hypertension   . Insomnia    on meds  . Migraines   . Obesity   . Sleep apnea    occas. uses CPAP    Patient Active Problem List   Diagnosis Date Noted  . Acute upper respiratory infection 08/20/2017  . Acute pain of left shoulder 04/20/2017  . Right knee pain 02/20/2017  . Fall 02/20/2017  . Odynophagia 12/03/2016  . Screening-pulmonary TB 12/03/2016  . Routine health  maintenance 08/28/2016  . Anxiety 08/28/2016  . Pain in joint of right shoulder 04/25/2016  . Right hand pain 04/16/2016  . Right cervical radiculopathy 03/12/2016  . Primary osteoarthritis of right knee 02/19/2015  . Essential hypertension 02/19/2015  . Left-sided low back pain without sciatica 01/04/2015  . Polyuria 01/03/2015  . Left knee pain 10/31/2014  . Back pain 09/20/2014  . Pain of right breast 06/26/2014  . Melasma 05/24/2014  . Insomnia 01/19/2014  . Hx Herpes simplex virus type 1 (HSV-1) dermatitis 07/22/2013  . Trigeminal neuralgia 01/18/2013  . Skin hypopigmentation 09/16/2012  . Blurry vision, bilateral 07/31/2012  . Leg pain 03/10/2012  . Menorrhagia 09/16/2011  . Allergic rhinitis 04/10/2011  . 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 Derangement of medial meniscus of right knee 02/06/2009  . HTN, goal below 140/90 09/27/2008  . VENOUS INSUFFICIENCY, LEGS 07/07/2008  . SLEEP APNEA, OBSTRUCTIVE 03/24/2008  . 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 ARTHRITIS, ACROMIOCLAVICULAR 11/24/2007  . ROTATOR CUFF SPRAIN AND STRAIN 11/24/2007  . MIGRAINE NOS W/O INTRACTABLE MIGRAINE 08/23/2007  . Morbid obesity ([place]) 06/04/2007  . Depression 06/04/2007  . Chest pain 03/25/2007    Past Surgical History:  Procedure Laterality Date  . BARIATRIC SURGERY  02/06/2017  . CESAREAN SECTION     x 3  . CHOLECYSTECTOMY    .  ENDOMETRIAL ABLATION    . KNEE SURGERY  oct 2013   minesctomy and debridement  . NECK SURGERY  2001   cervical fusion C4/5  . TUBAL LIGATION  1992    OB History    Gravida Para Term Preterm AB Living   3 3 3  0 0 3   SAB TAB Ectopic Multiple Live Births   0 0 0 0 3       Home Medications    Prior to Admission medications   Medication Sig Start Date End Date Taking? Authorizing Provider  albuterol (VENTOLIN HFA) 108 (90 Base) MCG/ACT inhaler INHALE 2 PUFFS INTO THE LUNGS EVERY 6 (SIX) HOURS AS NEEDED FOR WHEEZING OR  SHORTNESS OF BREATH. 07/14/17   Mercy Riding, MD  Biotin 1000 MCG CHEW Chew 1 tablet by mouth daily. 02/06/17   [provider]  cetirizine (ZYRTEC) 10 MG tablet TAKE 1 TABLET (10 MG) BY MOUTH DAILY AS NEEDED 07/23/17   Mercy Riding, MD  ferrous sulfate 325 (65 FE) MG EC tablet Take 325 mg by mouth every morning. 02/06/17   [provider]  fluticasone (FLONASE) 50 MCG/ACT nasal spray Place 2 sprays into both nostrils daily. 08/20/17   Mikell, Jeani Sow, MD  guaiFENesin-dextromethorphan (ROBITUSSIN DM) 100-10 MG/5ML syrup Take 5 mLs by mouth every 4 (four) hours as needed for cough. 08/20/17   Mikell, Jeani Sow, MD  ibuprofen (ADVIL,MOTRIN) 800 MG tablet Take 1 tablet (800 mg total) by mouth 3 (three) times daily. 08/20/17   Etta Quill, NP  meloxicam (MOBIC) 15 MG tablet TAKE 1 TABLET BY MOUTH EVERY DAY 07/31/17   Mercy Riding, MD  montelukast (SINGULAIR) 10 MG tablet TAKE 1 TABLET BY MOUTH AT BEDTIME. 07/15/17   Mercy Riding, MD  Multiple Vitamin (MULTIVITAMIN WITH MINERALS) TABS tablet Take 1 tablet by mouth daily. 02/06/17   [provider]  omeprazole (PRILOSEC) 20 MG capsule Take 20 mg by mouth 2 (two) times daily before a meal. 02/06/17   [provider]  polyethylene glycol powder (GLYCOLAX/MIRALAX) powder Take 17 g by mouth daily as needed for moderate constipation or severe constipation. 08/12/17   Mercy Riding, MD  sertraline (ZOLOFT) 50 MG tablet Take 1 tablet (50 mg total) by mouth daily. 06/29/17   Mercy Riding, MD  tiZANidine (ZANAFLEX) 4 MG capsule Take 1 capsule (4 mg total) by mouth 3 (three) times daily as needed for muscle spasms (and pain). 12/02/16   Clayton Bibles, PA-C  topiramate (TOPAMAX) 50 MG tablet TAKE 1 TABLET (50 MG TOTAL) BY MOUTH 2 (TWO) TIMES DAILY. 04/30/17   Mercy Riding, MD  traZODone (DESYREL) 50 MG tablet Take 0.5-1 tablets (25-50 mg total) by mouth at bedtime as needed for sleep. 06/29/17   Mercy Riding, MD  valACYclovir (VALTREX) 500  MG tablet Take 1 tablet (500 mg total) by mouth 2 (two) times daily. 06/29/17   Mercy Riding, MD  vitamin C (ASCORBIC ACID) 500 MG tablet Take 500 mg by mouth daily. 02/06/17   [provider]  VITAMIN D, CHOLECALCIFEROL, PO Take by mouth.    [provider]    Family History Family History  Problem Relation Age of Onset  . Diabetes Mother   . Heart failure Mother   . Hypertension Mother   . Stroke Mother   . Diabetes Father   . Alcohol abuse Father   . Cirrhosis Father   . Diabetes Sister   . Hypertension Sister   .  Cancer Paternal Aunt        cevical  . Cancer Paternal Aunt        cervical  . Drug abuse Brother   . Heart attack Brother 58  . Asthma Daughter   . Depression Daughter   . Anxiety disorder Daughter   . Asthma Daughter     Social History Social History  Substance Use Topics  . Smoking status: Never Smoker  . Smokeless tobacco: Never Used  . Alcohol use No     Allergies   Ace inhibitors; Angiotensin receptor blockers; and Latex   Review of Systems Review of Systems  Constitutional: Negative for fever.  Respiratory: Positive for cough.   Cardiovascular: Positive for chest pain.  Gastrointestinal: Positive for constipation. Negative for abdominal pain, nausea and vomiting.  Genitourinary: Negative for dysuria.  Musculoskeletal: Negative for back pain.  All other systems reviewed and are negative.    Physical Exam Updated Vital Signs BP 120/71 (BP Location: Right Arm)   Pulse 64   Temp 98 F (36.7 C) (Oral)   Resp 16   Ht 5\' 1"  (1.549 m)   Wt 92.1 kg (203 lb)   SpO2 100%   BMI 38.36 kg/m   Physical Exam  Constitutional: She is oriented to person, place, and time. She appears well-developed and well-nourished.  HENT:  Head: Normocephalic.  Eyes: Conjunctivae are normal.  Neck: Neck supple.  Cardiovascular: Normal rate and regular rhythm.   Pulmonary/Chest: Effort normal and breath sounds normal.  Abdominal: Soft.    Musculoskeletal: Normal range of motion.  Lymphadenopathy:    She has no cervical adenopathy.  Neurological: She is alert and oriented to person, place, and time.  Skin: Skin is warm and dry.  Psychiatric: She has a normal mood and affect.  Nursing note and vitals reviewed.    ED Treatments / Results  Labs (all labs ordered are listed, but only abnormal results are displayed) Labs Reviewed - No data to display  EKG  EKG Interpretation None       Radiology Dg Chest 2 View  Result Date: 08/20/2017 CLINICAL DATA:  Cough, congestion, right chest wall pain EXAM: CHEST  2 VIEW COMPARISON:  12/02/2016 FINDINGS: Lungs are essentially clear. Mild right basilar opacity, likely atelectasis. No pleural effusion or pneumothorax. The heart is normal in size. Lucency beneath the right hemidiaphragm likely reflects colonic interposition. Visualized osseous structures are within normal limits. IMPRESSION: No active cardiopulmonary disease. Electronically Signed   By: Julian Hy M.D.   On: 08/20/2017 22:24    Procedures Procedures (including critical care time)  Medications Ordered in ED Medications  ketorolac (TORADOL) 30 MG/ML injection 30 mg (30 mg Intramuscular Given 08/20/17 2248)     Initial Impression / Assessment and Plan / ED Course  I have reviewed the triage vital signs and the nursing notes.  Pertinent labs & imaging results that were available during my care of the patient were reviewed by me and considered in my medical decision making (see chart for details).     Patient is to be discharged with recommendation to follow up with PCP in regards to today's hospital visit. Chest pain is not likely of cardiac or pulmonary etiology d/t presentation, perc negative, VSS, no tracheal deviation, no JVD or new murmur, RRR, breath sounds equal bilaterally.  Negative CXR. Pt has been advised to return to the ED is CP becomes exertional, associated with diaphoresis or nausea,  radiates to left jaw/arm, worsens or becomes concerning in any  way. Pt appears reliable for follow up and is agreeable to discharge.   Case has been discussed with and seen by Dr.James who agrees with the above plan to discharge.   Final Clinical Impressions(s) / ED Diagnoses   Final diagnoses:  Right-sided chest wall pain    New Prescriptions Discharge Medication List as of 08/20/2017 11:17 PM    START taking these medications   Details  ibuprofen (ADVIL,MOTRIN) 800 MG tablet Take 1 tablet (800 mg total) by mouth 3 (three) times daily., Starting Thu 08/20/2017, Print         Etta Quill, NP 08/21/17 Ileene Musa    Tanna Furry, MD 08/28/17 [phone]

## 2017-08-27 ENCOUNTER — Encounter: Payer: Self-pay | Admitting: Obstetrics and Gynecology

## 2017-08-27 ENCOUNTER — Other Ambulatory Visit (HOSPITAL_COMMUNITY)
Admission: RE | Admit: 2017-08-27 | Discharge: 2017-08-27 | Disposition: A | Discharge status: Home / Self Care | Payer: Medicare Other | Source: Ambulatory Visit | Attending: Obstetrics and Gynecology | Admitting: Obstetrics and Gynecology

## 2017-08-27 ENCOUNTER — Ambulatory Visit (INDEPENDENT_AMBULATORY_CARE_PROVIDER_SITE_OTHER): Payer: Medicare Other | Admitting: Obstetrics and Gynecology

## 2017-08-27 VITALS — BP 133/75 | HR 64 | Ht 61.0 in | Wt 204.0 lb

## 2017-08-27 DIAGNOSIS — Z124 Encounter for screening for malignant neoplasm of cervix: Secondary | ICD-10-CM | POA: Diagnosis not present

## 2017-08-27 DIAGNOSIS — Z01411 Encounter for gynecological examination (general) (routine) with abnormal findings: Secondary | ICD-10-CM | POA: Diagnosis not present

## 2017-08-27 DIAGNOSIS — Z87410 Personal history of cervical dysplasia: Secondary | ICD-10-CM | POA: Insufficient documentation

## 2017-08-27 DIAGNOSIS — Z01419 Encounter for gynecological examination (general) (routine) without abnormal findings: Secondary | ICD-10-CM | POA: Diagnosis not present

## 2017-08-27 DIAGNOSIS — Z113 Encounter for screening for infections with a predominantly sexual mode of transmission: Secondary | ICD-10-CM | POA: Diagnosis not present

## 2017-08-27 DIAGNOSIS — N952 Postmenopausal atrophic vaginitis: Secondary | ICD-10-CM

## 2017-08-27 NOTE — Progress Notes (Signed)
Obstetrics and Gynecology Annual Patient Evaluation  Appointment Date: 08/27/2017  OBGYN Clinic: [facility]  Primary Care Provider: Wendee Beavers T  Chief Complaint:  Chief Complaint  Patient presents with  . Annual Exam    History of Present Illness: Jessica [name] is a 56 y.o. African-American [id number] (No LMP recorded. Patient has had an ablation.), seen for the above chief complaint.   She denies any postmenopausal bleeding or spotting since her 2012 endometrial ablation. Her only concern is vaginal dryness and some pain with intercourse. She hasn't tried anything OTC and she also notes some hot flashes.    No breast s/s, fevers, chills, chest pain, SOB, nausea, vomiting, abdominal pain, dysuria, hematuria, vaginal itching, diarrhea, constipation, blood in BMs  Review of Systems: as noted in the History of Present Illness.   Past Medical History:  Past Medical History:  Diagnosis Date  . Anal fissure    history  . Angioedema of lips 03/21/2011   Presumed 2nd to ACE   . Arthritis    feet  . BV (bacterial vaginosis)    recent dx - finish med 12/22/11  . Depression     on meds  . Headache(784.0)   . HSV (herpes simplex virus) infection   . Hypertension   . Insomnia    on meds  . Migraines   . Obesity   . Sleep apnea    occas. uses CPAP    Past Surgical History:  Past Surgical History:  Procedure Laterality Date  . BARIATRIC SURGERY  02/06/2017   laparoscopic sleeve  . CESAREAN SECTION     x 3  . CHOLECYSTECTOMY    . ENDOMETRIAL ABLATION  2012   Dr. Harolyn Rutherford. Hysteroscopy, NovaSure Endometrial Ablation, Rollerball Ablation  . KNEE SURGERY  oct 2013   minesctomy and debridement  . NECK SURGERY  2001   cervical fusion C4/5  . TUBAL LIGATION  1992    Past Obstetrical History:  OB History  Gravida Para Term Preterm AB Living  3 3 3  0 0 3  SAB TAB Ectopic Multiple Live Births  0 0 0 0 3    # Outcome Date GA Lbr Len/2nd  Weight Sex Delivery Anes PTL Lv  3 Term         LIV  2 Term         LIV  1 Term         LIV      SVD x 3  Past Gynecological History: As per HPI. History of Pap Smear(s): Yes.   Last pap 8/2-17, which was NILM/HPV neg (no subtyping done) History of HRT use: No  Social History:  Social History   Social History  . Marital status: Divorced    Spouse name: N/A  . Number of children: N/A  . Years of education: N/A   Occupational History  . Not on file.   Social History Main Topics  . Smoking status: Never Smoker  . Smokeless tobacco: Never Used  . Alcohol use No  . Drug use: No  . Sexual activity: Not Currently    Birth control/ protection: Surgical     Comment: tubaligation   Other Topics Concern  . Not on file   Social History Narrative   December 14, 2012 patient recently lost her second job. She  just got custody of her 4 year old niece. She is concerned about finances.   03/22/12: Niece is living with her.  Niece is doing better, but still  struggling in school.      Current Social History 08/11/2017        Who lives at home: Patient lives alone in one level home 08/11/2017    Transportation: Patient has own vehicle  08/11/2017   Important Relationships 3 daughters and 3 grandchildren (2 boys, 1 girl) 08/11/2017    Pets: 2 yorkies: Building services engineer and [employer] 08/11/2017   Education / Work:  12 th grade/ Disabled 08/11/2017   Interests / Fun: Volunteering 08/11/2017   Current Stressors: Finances 08/11/2017   Religious / Personal Beliefs: Raised [employer] 08/11/2017   Other: "Love helping people, big giver, love working with the elderly" 08/11/2017   L. Ducatte, RN, BSN                                                                                                        Family History:  Family History  Problem Relation Age of Onset  . Diabetes Mother   . Heart failure Mother   . Hypertension Mother   . Stroke Mother   . Diabetes Father   . Alcohol abuse Father   . Cirrhosis  Father   . Diabetes Sister   . Hypertension Sister   . Cancer Paternal Aunt        cevical  . Cancer Paternal Aunt        cervical  . Drug abuse Brother   . Heart attack Brother 2  . Asthma Daughter   . Depression Daughter   . Anxiety disorder Daughter   . Asthma Daughter   see "media"  Health Maintenance:  Mammogram(s): Yes.   Date: 07/2017 bi-rads 1 Colonoscopy: Yes.   Date: 2014, 5 or 10 year follow up  Medications Ms. Perleberg had no medications administered during this visit. Current Outpatient Prescriptions  Medication Sig Dispense Refill  . albuterol (VENTOLIN HFA) 108 (90 Base) MCG/ACT inhaler INHALE 2 PUFFS INTO THE LUNGS EVERY 6 (SIX) HOURS AS NEEDED FOR WHEEZING OR SHORTNESS OF BREATH. 18 Inhaler 0  . Biotin 1000 MCG CHEW Chew 1 tablet by mouth daily.    . cetirizine (ZYRTEC) 10 MG tablet TAKE 1 TABLET (10 MG) BY MOUTH DAILY AS NEEDED 30 tablet 1  . ferrous sulfate 325 (65 FE) MG EC tablet Take 325 mg by mouth every morning.    . fluticasone (FLONASE) 50 MCG/ACT nasal spray Place 2 sprays into both nostrils daily. 16 g 6  . guaiFENesin-dextromethorphan (ROBITUSSIN DM) 100-10 MG/5ML syrup Take 5 mLs by mouth every 4 (four) hours as needed for cough. 118 mL 0  . ibuprofen (ADVIL,MOTRIN) 800 MG tablet Take 1 tablet (800 mg total) by mouth 3 (three) times daily. 21 tablet 0  . meloxicam (MOBIC) 15 MG tablet TAKE 1 TABLET BY MOUTH EVERY DAY 30 tablet 0  . montelukast (SINGULAIR) 10 MG tablet TAKE 1 TABLET BY MOUTH AT BEDTIME. 30 tablet 5  . Multiple Vitamin (MULTIVITAMIN WITH MINERALS) TABS tablet Take 1 tablet by mouth daily.    Marland Kitchen omeprazole (PRILOSEC) 20 MG capsule Take 20 mg by mouth 2 (two) times daily  before a meal.    . polyethylene glycol powder (GLYCOLAX/MIRALAX) powder Take 17 g by mouth daily as needed for moderate constipation or severe constipation. 527 g 1  . sertraline (ZOLOFT) 50 MG tablet Take 1 tablet (50 mg total) by mouth daily. 30 tablet 3  . tiZANidine  (ZANAFLEX) 4 MG capsule Take 1 capsule (4 mg total) by mouth 3 (three) times daily as needed for muscle spasms (and pain). 15 capsule 0  . topiramate (TOPAMAX) 50 MG tablet TAKE 1 TABLET (50 MG TOTAL) BY MOUTH 2 (TWO) TIMES DAILY. 180 tablet 1  . traZODone (DESYREL) 50 MG tablet Take 0.5-1 tablets (25-50 mg total) by mouth at bedtime as needed for sleep. 30 tablet 3  . valACYclovir (VALTREX) 500 MG tablet Take 1 tablet (500 mg total) by mouth 2 (two) times daily. 10 tablet 2  . vitamin C (ASCORBIC ACID) 500 MG tablet Take 500 mg by mouth daily.    Marland Kitchen VITAMIN D, CHOLECALCIFEROL, PO Take by mouth.     No current facility-administered medications for this visit.     Allergies Ace inhibitors; Angiotensin receptor blockers; and Latex   Physical Exam:  BP 133/75 (BP Location: Left Arm, Patient Position: Sitting, Cuff Size: Large)   Pulse 64   Ht 5\' 1"  (1.549 m)   Wt 204 lb (92.5 kg)   BMI 38.55 kg/m  Body mass index is 38.55 kg/m. General appearance: Well nourished, well developed female in no acute distress.  Neck:  Supple, normal appearance, and no thyromegaly  Cardiovascular: normal s1 and s2.  No murmurs, rubs or gallops. Respiratory:  Clear to auscultation bilateral. Normal respiratory effort Abdomen: positive bowel sounds and no masses, hernias; diffusely non tender to palpation, non distended Breasts: breasts appear normal, no suspicious masses, no skin or nipple changes or axillary nodes, and normal palpation. Neuro/Psych:  Normal mood and affect.  Skin:  Warm and dry.  Lymphatic:  No inguinal lymphadenopathy.   Pelvic exam: is not limited by body habitus EGBUS: within normal limits with moderate atrophy, Vagina: within normal limits and with no blood or discharge in the vault, +moderate atrophy, Cervix: normal appearing cervix without tenderness, discharge or lesions, ext os is pinpoint and closed. Uterus:  nonenlarged and non tender and Adnexa:  normal adnexa and no mass,  fullness, tenderness Rectovaginal: deferred  Laboratory: none  Radiology: none  Assessment: pt doing well  Plan:  1. Encounter for gynecological examination (general) (routine) with abnormal findings Routine care. Pt desires STI testing.  - Cytology - PAP - HIV antibody (with reflex) - Hepatitis B Surface AntiGEN - RPR - Hepatitis C Antibody  2. History of cervical dysplasia Repeat pap and HPV testing - Cytology - PAP  3. Atrophy D/w her re: use of oil and water based lubricants and benefits of vaginal HRT. D/w her re: theoretical risks of vaginal estrogen use but is well tolerated with minimal issues. Pt to consider and let us know if she'd like to use vaginal estrogen.   Orders Placed This Encounter  Procedures  . HIV antibody (with reflex)  . Hepatitis B Surface AntiGEN  . RPR  . Hepatitis C Antibody    RTC 1 year  Durene Romans MD Attending [facility] for [facility] [employer] farm [employer])

## 2017-08-27 NOTE — Patient Instructions (Signed)
Estradiol vaginal cream What is this medicine? ESTRADIOL (es tra DYE ole) contains the female hormone estrogen. It is used for symptoms of menopause, like vaginal dryness and irritation. This medicine may be used for other purposes; ask your health care provider or pharmacist if you have questions. COMMON BRAND NAME(S): Estrace What should I tell my health care provider before I take this medicine? They need to know if you have any of these conditions: -abnormal vaginal bleeding -blood vessel disease or blood clots -breast, cervical, endometrial, ovarian, liver, or uterine cancer -dementia -diabetes -gallbladder disease -heart disease or recent heart attack -high blood pressure -high cholesterol -high levels of calcium in the blood -hysterectomy -kidney disease -liver disease -migraine headaches -protein C deficiency -protein S deficiency -stroke -systemic lupus erythematosus (SLE) -tobacco smoker -an unusual or allergic reaction to estrogens, other hormones, soy, other medicines, foods, dyes, or preservatives -pregnant or trying to get pregnant -breast-feeding How should I use this medicine? This medicine is for use in the vagina only. Do not take by mouth. Follow the directions on the prescription label. Read package directions carefully before using. Use the special applicator supplied with the cream. Wash hands before and after use. Fill the applicator with the prescribed amount of cream. Lie on your back, part and bend your knees. Insert the applicator into the vagina and push the plunger to expel the cream into the vagina. Wash the applicator with warm soapy water and rinse well. Use exactly as directed for the complete length of time prescribed. Do not stop using except on the advice of your doctor or health care professional. A patient package insert for the product will be given with each prescription and refill. Read this sheet carefully each time. The sheet may change  frequently. Talk to your pediatrician regarding the use of this medicine in children. This medicine is not approved for use in children. Overdosage: If you think you have taken too much of this medicine contact a poison control center or emergency room at once. NOTE: This medicine is only for you. Do not share this medicine with others. What if I miss a dose? If you miss a dose, use it as soon as you can. If it is almost time for your next dose, use only that dose. Do not use double or extra doses. What may interact with this medicine? Do not take this medicine with any of the following medications: -aromatase inhibitors like aminoglutethimide, anastrozole, exemestane, letrozole, testolactone This medicine may also interact with the following medications: -barbiturates used for inducing sleep or treating seizures -carbamazepine -grapefruit juice -medicines for fungal infections like ketoconazole and itraconazole -raloxifene -rifabutin -rifampin -rifapentine -ritonavir -some antibiotics used to treat infections -St. John's Wort -tamoxifen -warfarin This list may not describe all possible interactions. Give your health care provider a list of all the medicines, herbs, non-prescription drugs, or dietary supplements you use. Also tell them if you smoke, drink alcohol, or use illegal drugs. Some items may interact with your medicine. What should I watch for while using this medicine? Visit your health care professional for regular checks on your progress. You will need a regular breast and pelvic exam. You should also discuss the need for regular mammograms with your health care professional, and follow his or her guidelines. This medicine can make your body retain fluid, making your fingers, hands, or ankles swell. Your blood pressure can go up. Contact your doctor or health care professional if you feel you are retaining fluid. If you have  any reason to think you are pregnant, stop taking  this medicine at once and contact your doctor or health care professional. Tobacco smoking increases the risk of getting a blood clot or having a stroke, especially if you are more than 54 years old. You are strongly advised not to smoke. If you wear contact lenses and notice visual changes, or if the lenses begin to feel uncomfortable, consult your eye care specialist. If you are going to have elective surgery, you may need to stop taking this medicine beforehand. Consult your health care professional for advice prior to scheduling the surgery. What side effects may I notice from receiving this medicine? Side effects that you should report to your doctor or health care professional as soon as possible: -allergic reactions like skin rash, itching or hives, swelling of the face, lips, or tongue -breast tissue changes or discharge -changes in vision -chest pain -confusion, trouble speaking or understanding -dark urine -general ill feeling or flu-like symptoms -light-colored stools -nausea, vomiting -pain, swelling, warmth in the leg -right upper belly pain -severe headaches -shortness of breath -sudden numbness or weakness of the face, arm or leg -trouble walking, dizziness, loss of balance or coordination -unusual vaginal bleeding -yellowing of the eyes or skin Side effects that usually do not require medical attention (report to your doctor or health care professional if they continue or are bothersome): -hair loss -increased hunger or thirst -increased urination -symptoms of vaginal infection like itching, irritation or unusual discharge -unusually weak or tired This list may not describe all possible side effects. Call your doctor for medical advice about side effects. You may report side effects to FDA at 1-800-FDA-1088. Where should I keep my medicine? Keep out of the reach of children. Store at room temperature between 15 and 30 degrees C (59 and 86 degrees F). Protect from  temperatures above 40 degrees C (104 degrees C). Do not freeze. Throw away any unused medicine after the expiration date. NOTE: This sheet is a summary. It may not cover all possible information. If you have questions about this medicine, talk to your doctor, pharmacist, or health care provider.  2018 Elsevier/Gold Standard (2011-03-19 09:18:12) Atrophic Vaginitis Atrophic vaginitis is when the tissues that line the vagina become dry and thin. This is caused by a drop in estrogen. Estrogen helps:  To keep the vagina moist.  To make a clear fluid that helps: ? To lubricate the vagina for sex. ? To protect the vagina from infection.  If the lining of the vagina is dry and thin, it may:  Make sex painful. It may also cause bleeding.  Cause a feeling of: ? Burning. ? Irritation. ? Itchiness.  Make an exam of your vagina painful. It may also cause bleeding.  Make you lose interest in sex.  Cause a burning feeling when you pee.  Make your vaginal fluid (discharge) brown or yellow.  For some women, there are no symptoms. This condition is most common in women who do not get their regular menstrual periods anymore (menopause). This often starts when a woman is 58-52 years old. Follow these instructions at home:  Take medicines only as told by your doctor. Do not use any herbal or alternative medicines unless your doctor says it is okay.  Use over-the-counter products for dryness only as told by your doctor. These include: ? Creams. ? Lubricants. ? Moisturizers.  Do not douche.  Do not use products that can make your vagina dry. These include: ? Scented feminine  sprays. ? Scented tampons. ? Scented soaps.  If it hurts to have sex, tell your sexual partner. Contact a doctor if:  Your discharge looks different than normal.  Your vagina has an unusual smell.  You have new symptoms.  Your symptoms do not get better with treatment.  Your symptoms get worse. This  information is not intended to replace advice given to you by your health care provider. Make sure you discuss any questions you have with your health care provider. Document Released: 06/02/2008 Document Revised: 05/22/2016 Document Reviewed: 12/06/2014 Elsevier Interactive Patient Education  [employer].

## 2017-08-28 LAB — HEPATITIS C ANTIBODY: Hep C Virus Ab: 0.1 s/co ratio (ref 0.0–0.9)

## 2017-08-28 LAB — HIV ANTIBODY (ROUTINE TESTING W REFLEX): HIV SCREEN 4TH GENERATION: NONREACTIVE

## 2017-08-28 LAB — RPR: RPR: NONREACTIVE

## 2017-08-28 LAB — HEPATITIS B SURFACE ANTIGEN: HEP B S AG: NEGATIVE

## 2017-09-01 LAB — CYTOLOGY - PAP
CHLAMYDIA, DNA PROBE: NEGATIVE
Diagnosis: NEGATIVE
HPV (WINDOPATH): NOT DETECTED
Neisseria Gonorrhea: NEGATIVE
TRICH (WINDOWPATH): NEGATIVE

## 2017-09-02 ENCOUNTER — Telehealth: Payer: Self-pay

## 2017-09-02 NOTE — Telephone Encounter (Signed)
Informed patient of lab and pap results.

## 2017-10-08 ENCOUNTER — Telehealth: Payer: Self-pay | Admitting: Student

## 2017-10-08 MED ORDER — DULOXETINE HCL 60 MG PO CPEP: 60.0000 mg | ORAL_CAPSULE | Freq: Every day | ORAL | 3 refills | Status: DC

## 2017-10-08 NOTE — Telephone Encounter (Signed)
Called and talked to patient in response to a notification I received from [facility] central office. There was a concern about the patient taking Cymbalta and sertraline together. I called and talked to patient. Patient reports taking Cymbalta not sertraline. She admits accidentally fillling sertraline recently. However, she says she is only taking Cymbalta. I called her pharmacy and talked to [place]. I requested her to discontinue sertraline from her medication list.

## 2017-10-10 ENCOUNTER — Other Ambulatory Visit: Payer: Self-pay | Admitting: Student

## 2017-10-15 ENCOUNTER — Other Ambulatory Visit: Payer: Self-pay | Admitting: Student

## 2017-10-15 DIAGNOSIS — B0089 Other herpesviral infection: Secondary | ICD-10-CM

## 2017-10-22 ENCOUNTER — Other Ambulatory Visit: Payer: Self-pay | Admitting: Student

## 2017-10-22 DIAGNOSIS — G4700 Insomnia, unspecified: Secondary | ICD-10-CM

## 2017-10-25 ENCOUNTER — Other Ambulatory Visit: Payer: Self-pay | Admitting: Student

## 2017-11-04 ENCOUNTER — Other Ambulatory Visit: Payer: Self-pay | Admitting: Student

## 2017-12-14 ENCOUNTER — Other Ambulatory Visit: Payer: Self-pay | Admitting: Student

## 2017-12-31 ENCOUNTER — Other Ambulatory Visit: Payer: Self-pay | Admitting: Student

## 2018-01-17 DIAGNOSIS — I10 Essential (primary) hypertension: Secondary | ICD-10-CM | POA: Diagnosis not present

## 2018-01-17 DIAGNOSIS — J069 Acute upper respiratory infection, unspecified: Secondary | ICD-10-CM | POA: Diagnosis not present

## 2018-01-17 DIAGNOSIS — R042 Hemoptysis: Secondary | ICD-10-CM | POA: Diagnosis not present

## 2018-01-17 DIAGNOSIS — J3489 Other specified disorders of nose and nasal sinuses: Secondary | ICD-10-CM | POA: Diagnosis not present

## 2018-02-03 DIAGNOSIS — Z9884 Bariatric surgery status: Secondary | ICD-10-CM | POA: Diagnosis not present

## 2018-02-03 DIAGNOSIS — Z903 Acquired absence of stomach [part of]: Secondary | ICD-10-CM | POA: Diagnosis not present

## 2018-02-03 DIAGNOSIS — K912 Postsurgical malabsorption, not elsewhere classified: Secondary | ICD-10-CM | POA: Diagnosis not present

## 2018-02-16 DIAGNOSIS — I10 Essential (primary) hypertension: Secondary | ICD-10-CM | POA: Diagnosis not present

## 2018-02-16 DIAGNOSIS — G4733 Obstructive sleep apnea (adult) (pediatric): Secondary | ICD-10-CM | POA: Diagnosis not present

## 2018-02-16 DIAGNOSIS — Z903 Acquired absence of stomach [part of]: Secondary | ICD-10-CM | POA: Diagnosis not present

## 2018-02-26 DIAGNOSIS — M4306 Spondylolysis, lumbar region: Secondary | ICD-10-CM | POA: Diagnosis not present

## 2018-02-26 DIAGNOSIS — M170 Bilateral primary osteoarthritis of knee: Secondary | ICD-10-CM | POA: Diagnosis not present

## 2018-02-26 DIAGNOSIS — M5136 Other intervertebral disc degeneration, lumbar region: Secondary | ICD-10-CM | POA: Diagnosis not present

## 2018-03-08 DIAGNOSIS — R269 Unspecified abnormalities of gait and mobility: Secondary | ICD-10-CM | POA: Diagnosis not present

## 2018-03-08 DIAGNOSIS — M545 Low back pain: Secondary | ICD-10-CM | POA: Diagnosis not present

## 2018-03-11 DIAGNOSIS — M545 Low back pain: Secondary | ICD-10-CM | POA: Diagnosis not present

## 2018-03-11 DIAGNOSIS — R269 Unspecified abnormalities of gait and mobility: Secondary | ICD-10-CM | POA: Diagnosis not present

## 2018-03-18 DIAGNOSIS — R269 Unspecified abnormalities of gait and mobility: Secondary | ICD-10-CM | POA: Diagnosis not present

## 2018-03-18 DIAGNOSIS — M545 Low back pain: Secondary | ICD-10-CM | POA: Diagnosis not present

## 2018-03-25 DIAGNOSIS — R269 Unspecified abnormalities of gait and mobility: Secondary | ICD-10-CM | POA: Diagnosis not present

## 2018-03-25 DIAGNOSIS — M545 Low back pain: Secondary | ICD-10-CM | POA: Diagnosis not present

## 2018-03-30 ENCOUNTER — Other Ambulatory Visit: Payer: Self-pay | Admitting: Student

## 2018-03-30 DIAGNOSIS — G4700 Insomnia, unspecified: Secondary | ICD-10-CM

## 2018-04-01 DIAGNOSIS — R269 Unspecified abnormalities of gait and mobility: Secondary | ICD-10-CM | POA: Diagnosis not present

## 2018-04-01 DIAGNOSIS — M545 Low back pain: Secondary | ICD-10-CM | POA: Diagnosis not present

## 2018-04-03 ENCOUNTER — Other Ambulatory Visit: Payer: Self-pay | Admitting: Student

## 2018-04-03 DIAGNOSIS — B0089 Other herpesviral infection: Secondary | ICD-10-CM

## 2018-04-05 MED ORDER — VALACYCLOVIR HCL 1 G PO TABS: 2000.0000 mg | ORAL_TABLET | Freq: Two times a day (BID) | ORAL | 3 refills | Status: DC

## 2018-04-08 DIAGNOSIS — R269 Unspecified abnormalities of gait and mobility: Secondary | ICD-10-CM | POA: Diagnosis not present

## 2018-04-08 DIAGNOSIS — M545 Low back pain: Secondary | ICD-10-CM | POA: Diagnosis not present

## 2018-04-14 ENCOUNTER — Other Ambulatory Visit: Payer: Self-pay

## 2018-04-14 DIAGNOSIS — B0089 Other herpesviral infection: Secondary | ICD-10-CM

## 2018-04-14 MED ORDER — ACYCLOVIR 5 % EX OINT: 1.0000 "application " | TOPICAL_OINTMENT | CUTANEOUS | 1 refills | Status: DC

## 2018-04-27 DIAGNOSIS — R269 Unspecified abnormalities of gait and mobility: Secondary | ICD-10-CM | POA: Diagnosis not present

## 2018-04-27 DIAGNOSIS — M545 Low back pain: Secondary | ICD-10-CM | POA: Diagnosis not present

## 2018-04-29 DIAGNOSIS — R269 Unspecified abnormalities of gait and mobility: Secondary | ICD-10-CM | POA: Diagnosis not present

## 2018-04-29 DIAGNOSIS — M545 Low back pain: Secondary | ICD-10-CM | POA: Diagnosis not present

## 2018-05-03 DIAGNOSIS — R269 Unspecified abnormalities of gait and mobility: Secondary | ICD-10-CM | POA: Diagnosis not present

## 2018-05-03 DIAGNOSIS — M545 Low back pain: Secondary | ICD-10-CM | POA: Diagnosis not present

## 2018-05-05 DIAGNOSIS — M545 Low back pain: Secondary | ICD-10-CM | POA: Diagnosis not present

## 2018-05-05 DIAGNOSIS — R269 Unspecified abnormalities of gait and mobility: Secondary | ICD-10-CM | POA: Diagnosis not present

## 2018-05-07 DIAGNOSIS — I10 Essential (primary) hypertension: Secondary | ICD-10-CM | POA: Diagnosis not present

## 2018-05-07 DIAGNOSIS — M4306 Spondylolysis, lumbar region: Secondary | ICD-10-CM | POA: Diagnosis not present

## 2018-05-07 DIAGNOSIS — M170 Bilateral primary osteoarthritis of knee: Secondary | ICD-10-CM | POA: Diagnosis not present

## 2018-05-24 ENCOUNTER — Other Ambulatory Visit: Payer: Self-pay | Admitting: Student

## 2018-06-10 ENCOUNTER — Encounter: Payer: Self-pay | Admitting: Radiology

## 2018-06-10 ENCOUNTER — Other Ambulatory Visit: Payer: Self-pay | Admitting: Student

## 2018-06-10 DIAGNOSIS — G4700 Insomnia, unspecified: Secondary | ICD-10-CM

## 2018-06-18 ENCOUNTER — Other Ambulatory Visit: Payer: Self-pay | Admitting: Student

## 2018-06-18 DIAGNOSIS — B0089 Other herpesviral infection: Secondary | ICD-10-CM

## 2018-06-24 ENCOUNTER — Other Ambulatory Visit: Payer: Self-pay

## 2018-06-24 ENCOUNTER — Encounter (HOSPITAL_BASED_OUTPATIENT_CLINIC_OR_DEPARTMENT_OTHER): Payer: Self-pay | Admitting: Emergency Medicine

## 2018-06-24 ENCOUNTER — Emergency Department (HOSPITAL_BASED_OUTPATIENT_CLINIC_OR_DEPARTMENT_OTHER): Payer: Medicare Other

## 2018-06-24 ENCOUNTER — Emergency Department (HOSPITAL_BASED_OUTPATIENT_CLINIC_OR_DEPARTMENT_OTHER)
Admission: EM | Admit: 2018-06-24 | Discharge: 2018-06-24 | Disposition: A | Discharge status: Home / Self Care | Payer: Medicare Other | Attending: Emergency Medicine | Admitting: Emergency Medicine

## 2018-06-24 DIAGNOSIS — R51 Headache: Secondary | ICD-10-CM | POA: Diagnosis not present

## 2018-06-24 DIAGNOSIS — Z79899 Other long term (current) drug therapy: Secondary | ICD-10-CM | POA: Insufficient documentation

## 2018-06-24 DIAGNOSIS — I10 Essential (primary) hypertension: Secondary | ICD-10-CM | POA: Diagnosis not present

## 2018-06-24 DIAGNOSIS — Z91040 Latex allergy status: Secondary | ICD-10-CM | POA: Insufficient documentation

## 2018-06-24 DIAGNOSIS — N3000 Acute cystitis without hematuria: Secondary | ICD-10-CM | POA: Diagnosis not present

## 2018-06-24 DIAGNOSIS — Z9884 Bariatric surgery status: Secondary | ICD-10-CM | POA: Diagnosis not present

## 2018-06-24 DIAGNOSIS — N23 Unspecified renal colic: Secondary | ICD-10-CM | POA: Insufficient documentation

## 2018-06-24 DIAGNOSIS — N132 Hydronephrosis with renal and ureteral calculous obstruction: Secondary | ICD-10-CM | POA: Diagnosis not present

## 2018-06-24 DIAGNOSIS — R300 Dysuria: Secondary | ICD-10-CM | POA: Diagnosis present

## 2018-06-24 DIAGNOSIS — R109 Unspecified abdominal pain: Secondary | ICD-10-CM | POA: Diagnosis not present

## 2018-06-24 LAB — LIPASE, BLOOD: LIPASE: 31 U/L (ref 11–51)

## 2018-06-24 LAB — URINALYSIS, ROUTINE W REFLEX MICROSCOPIC
BILIRUBIN URINE: NEGATIVE
GLUCOSE, UA: NEGATIVE mg/dL
KETONES UR: NEGATIVE mg/dL
NITRITE: NEGATIVE
PROTEIN: 30 mg/dL — AB
Specific Gravity, Urine: 1.02 (ref 1.005–1.030)
pH: 7 (ref 5.0–8.0)

## 2018-06-24 LAB — CBC WITH DIFFERENTIAL/PLATELET
BASOS ABS: 0 10*3/uL (ref 0.0–0.1)
Basophils Relative: 0 %
Eosinophils Absolute: 0.1 10*3/uL (ref 0.0–0.7)
Eosinophils Relative: 2 %
HEMATOCRIT: 45.1 % (ref 36.0–46.0)
HEMOGLOBIN: 15.5 g/dL — AB (ref 12.0–15.0)
LYMPHS PCT: 31 %
Lymphs Abs: 2.3 10*3/uL (ref 0.7–4.0)
MCH: 31.6 pg (ref 26.0–34.0)
MCHC: 34.4 g/dL (ref 30.0–36.0)
MCV: 91.9 fL (ref 78.0–100.0)
MONO ABS: 0.8 10*3/uL (ref 0.1–1.0)
Monocytes Relative: 10 %
NEUTROS ABS: 4.3 10*3/uL (ref 1.7–7.7)
NEUTROS PCT: 57 %
Platelets: 230 10*3/uL (ref 150–400)
RBC: 4.91 MIL/uL (ref 3.87–5.11)
RDW: 13.9 % (ref 11.5–15.5)
WBC: 7.4 10*3/uL (ref 4.0–10.5)

## 2018-06-24 LAB — COMPREHENSIVE METABOLIC PANEL
ALK PHOS: 76 U/L (ref 38–126)
ALT: 12 U/L (ref 0–44)
AST: 24 U/L (ref 15–41)
Albumin: 3.9 g/dL (ref 3.5–5.0)
Anion gap: 7 (ref 5–15)
BILIRUBIN TOTAL: 0.5 mg/dL (ref 0.3–1.2)
BUN: 19 mg/dL (ref 6–20)
CO2: 25 mmol/L (ref 22–32)
CREATININE: 0.75 mg/dL (ref 0.44–1.00)
Calcium: 9.2 mg/dL (ref 8.9–10.3)
Chloride: 111 mmol/L (ref 98–111)
GFR calc Af Amer: >60 mL/min (ref >60)
GLUCOSE: 96 mg/dL (ref 70–99)
Potassium: 4.4 mmol/L (ref 3.5–5.1)
Sodium: 143 mmol/L (ref 135–145)
TOTAL PROTEIN: 8.1 g/dL (ref 6.5–8.1)

## 2018-06-24 LAB — URINALYSIS, MICROSCOPIC (REFLEX): WBC, UA: >50 WBC/hpf (ref 0–5)

## 2018-06-24 MED ORDER — MORPHINE SULFATE (PF) 4 MG/ML IV SOLN
4.0000 mg | Freq: Once | INTRAVENOUS | Status: AC
  Administered 2018-06-24: 4 mg via INTRAVENOUS
  Filled 2018-06-24: qty 1

## 2018-06-24 MED ORDER — ONDANSETRON 8 MG PO TBDP: 8.0000 mg | ORAL_TABLET | Freq: Three times a day (TID) | ORAL | 0 refills | Status: DC | PRN

## 2018-06-24 MED ORDER — IOPAMIDOL (ISOVUE-300) INJECTION 61%
100.0000 mL | Freq: Once | INTRAVENOUS | Status: AC | PRN
  Administered 2018-06-24: 100 mL via INTRAVENOUS

## 2018-06-24 MED ORDER — SODIUM CHLORIDE 0.9 % IV SOLN
1.0000 g | Freq: Once | INTRAVENOUS | Status: AC
  Administered 2018-06-24: 1 g via INTRAVENOUS
  Filled 2018-06-24: qty 10

## 2018-06-24 MED ORDER — KETOROLAC TROMETHAMINE 30 MG/ML IJ SOLN
15.0000 mg | Freq: Once | INTRAMUSCULAR | Status: AC
  Administered 2018-06-24: 15 mg via INTRAVENOUS
  Filled 2018-06-24: qty 1

## 2018-06-24 MED ORDER — OXYCODONE-ACETAMINOPHEN 5-325 MG PO TABS: 1.0000 | ORAL_TABLET | Freq: Three times a day (TID) | ORAL | 0 refills | Status: DC | PRN

## 2018-06-24 MED ORDER — SODIUM CHLORIDE 0.9 % IV BOLUS
1000.0000 mL | Freq: Once | INTRAVENOUS | Status: AC
  Administered 2018-06-24: 1000 mL via INTRAVENOUS

## 2018-06-24 MED ORDER — CEPHALEXIN 500 MG PO CAPS: 500.0000 mg | ORAL_CAPSULE | Freq: Four times a day (QID) | ORAL | 0 refills | Status: DC

## 2018-06-24 MED ORDER — IBUPROFEN 600 MG PO TABS: 600.0000 mg | ORAL_TABLET | Freq: Four times a day (QID) | ORAL | 0 refills | Status: DC | PRN

## 2018-06-24 MED FILL — OXYCODONE-ACETAMINOPHEN 5-3: 5-325 | 2 days supply | Qty: 6 | Fill #0

## 2018-06-24 MED FILL — CEPHALEXIN 500 MG CAPSULE: 500 | 5 days supply | Qty: 20 | Fill #0

## 2018-06-24 MED FILL — IBUPROFEN 600 MG TABLET: 600 | 8 days supply | Qty: 30 | Fill #0

## 2018-06-24 MED FILL — ONDANSETRON ODT 8 MG TABLET: 8 | 7 days supply | Qty: 20 | Fill #0

## 2018-06-24 NOTE — Discharge Instructions (Addendum)
Results in the ER show that you have a kidney stone on the right side and also urinary tract infection. We were able to get your pain is relative control, and we can safely send you home.  Take the meds prescribed. Set up an appointment with the Urologist. If the pain is unbearable, you start having fevers, chills, and are unable to keep any meds down - then return to the ER immediately.

## 2018-06-24 NOTE — ED Provider Notes (Signed)
[facility] Provider Note   CSN: [id number] Arrival date & time: 06/24/18  0431     History   Chief Complaint Chief Complaint  Patient presents with  . Dysuria  . Headache    HPI Jessica [name] is a 57 y.o. female.  HPI  57 year old female with multiple medical comorbidities and history of gastric sleeve comes in with chief complaint of abdominal pain, urinary frequency.  Patient states that she started having urinary frequency about 3 days ago.  She does not have associated burning with urination, however she does feel pressure-like feeling in the lower part of her abdomen.  Patient is also having pain in the upper quadrants of her abdomen.  Patient denies any nausea, vomiting, fevers, chills, back pain.  She also has been under a lot of stress and is having headache.  Since patient's symptoms have not improved over the past 3 days, she finally decided to come to the ER.  Review of system is negative for vaginal discharge, bleeding.  Past Medical History:  Diagnosis Date  . Anal fissure    history  . Angioedema of lips 03/21/2011   Presumed 2nd to ACE   . Arthritis    feet  . BV (bacterial vaginosis)    recent dx - finish med 12/22/11  . Depression     on meds  . Headache(784.0)   . HSV (herpes simplex virus) infection   . Hypertension   . Insomnia    on meds  . Migraines   . Obesity   . Sleep apnea    occas. uses CPAP    Patient Active Problem List   Diagnosis Date Noted  . Acute upper respiratory infection 08/20/2017  . Acute pain of left shoulder 04/20/2017  . Right knee pain 02/20/2017  . Fall 02/20/2017  . Odynophagia 12/03/2016  . Screening-pulmonary TB 12/03/2016  . Routine health maintenance 08/28/2016  . Anxiety 08/28/2016  . Pain in joint of right shoulder 04/25/2016  . Right hand pain 04/16/2016  . Right cervical radiculopathy 03/12/2016  . Primary osteoarthritis of right knee 02/19/2015  . Essential  hypertension 02/19/2015  . Left-sided low back pain without sciatica 01/04/2015  . Polyuria 01/03/2015  . Left knee pain 10/31/2014  . Back pain 09/20/2014  . Pain of right breast 06/26/2014  . Melasma 05/24/2014  . Insomnia 01/19/2014  . 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 Hx Herpes simplex virus type 1 (HSV-1) dermatitis 07/22/2013  . Trigeminal neuralgia 01/18/2013  . Skin hypopigmentation 09/16/2012  . Blurry vision, bilateral 07/31/2012  . Leg pain 03/10/2012  . Allergic rhinitis 04/10/2011  . 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 Derangement of medial meniscus of right knee 02/06/2009  . HTN, goal below 140/90 09/27/2008  . VENOUS INSUFFICIENCY, LEGS 07/07/2008  . SLEEP APNEA, OBSTRUCTIVE 03/24/2008  . 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 ARTHRITIS, ACROMIOCLAVICULAR 11/24/2007  . ROTATOR CUFF SPRAIN AND STRAIN 11/24/2007  . MIGRAINE NOS W/O INTRACTABLE MIGRAINE 08/23/2007  . Morbid obesity ([place]) 06/04/2007  . Depression 06/04/2007  . Chest pain 03/25/2007    Past Surgical History:  Procedure Laterality Date  . BARIATRIC SURGERY  02/06/2017   laparoscopic sleeve  . CESAREAN SECTION     x 3  . CHOLECYSTECTOMY    . ENDOMETRIAL ABLATION  2012   Dr. Harolyn Rutherford. Hysteroscopy, NovaSure Endometrial Ablation, Rollerball Ablation  . KNEE SURGERY  oct 2013   minesctomy and debridement  . NECK SURGERY  2001  cervical fusion C4/5  . TUBAL LIGATION  1992     OB History    Gravida  3   Para  3   Term  3   Preterm  0   AB  0   Living  3     SAB  0   TAB  0   Ectopic  0   Multiple  0   Live Births  3            Home Medications    Prior to Admission medications   Medication Sig Start Date End Date Taking? Authorizing Provider  albuterol (VENTOLIN HFA) 108 (90 Base) MCG/ACT inhaler INHALE 2 PUFFS INTO THE LUNGS EVERY 6 (SIX) HOURS AS NEEDED FOR WHEEZING OR SHORTNESS OF BREATH. 07/14/17   Mercy Riding, MD  Biotin 1000 MCG  CHEW Chew 1 tablet by mouth daily. 02/06/17   [provider]  cetirizine (ZYRTEC) 10 MG tablet TAKE 1 TABLET (10 MG) BY MOUTH DAILY AS NEEDED 12/14/17   Mercy Riding, MD  DULoxetine (CYMBALTA) 60 MG capsule Take 1 capsule (60 mg total) by mouth daily. 10/08/17   Mercy Riding, MD  ferrous sulfate 325 (65 FE) MG EC tablet Take 325 mg by mouth every morning. 02/06/17   [provider]  fluticasone (FLONASE) 50 MCG/ACT nasal spray Place 2 sprays into both nostrils daily. 08/20/17   Mikell, Jeani Sow, MD  guaiFENesin-dextromethorphan (ROBITUSSIN DM) 100-10 MG/5ML syrup Take 5 mLs by mouth every 4 (four) hours as needed for cough. 08/20/17   Mikell, Jeani Sow, MD  ibuprofen (ADVIL,MOTRIN) 800 MG tablet Take 1 tablet (800 mg total) by mouth 3 (three) times daily. 08/20/17   Etta Quill, NP  meloxicam (MOBIC) 15 MG tablet TAKE 1 TABLET BY MOUTH EVERY DAY 07/31/17   Mercy Riding, MD  montelukast (SINGULAIR) 10 MG tablet TAKE 1 TABLET BY MOUTH AT BEDTIME. 12/31/17   Mercy Riding, MD  Multiple Vitamin (MULTIVITAMIN WITH MINERALS) TABS tablet Take 1 tablet by mouth daily. 02/06/17   [provider]  omeprazole (PRILOSEC) 20 MG capsule Take 20 mg by mouth 2 (two) times daily before a meal. 02/06/17   [provider]  polyethylene glycol powder (GLYCOLAX/MIRALAX) powder TAKE 17 G BY MOUTH DAILY AS NEEDED FOR MODERATE CONSTIPATION OR SEVERE CONSTIPATION. 12/31/17   Mercy Riding, MD  tiZANidine (ZANAFLEX) 4 MG capsule Take 1 capsule (4 mg total) by mouth 3 (three) times daily as needed for muscle spasms (and pain). 12/02/16   Clayton Bibles, PA-C  topiramate (TOPAMAX) 50 MG tablet TAKE 1 TABLET (50 MG TOTAL) BY MOUTH 2 (TWO) TIMES DAILY. 05/25/18   Mercy Riding, MD  traZODone (DESYREL) 50 MG tablet TAKE 1/2 TO 1 TABLET AT BEDTIME AS NEEDED FOR SLEEP 06/10/18   Mercy Riding, MD  valACYclovir (VALTREX) 1000 MG tablet Take 2 tablets (2,000 mg total) by mouth every 12 (twelve) hours. 04/05/18    Mercy Riding, MD  vitamin C (ASCORBIC ACID) 500 MG tablet Take 500 mg by mouth daily. 02/06/17   [provider]  VITAMIN D, CHOLECALCIFEROL, PO Take by mouth.    [provider]    Family History Family History  Problem Relation Age of Onset  . Diabetes Mother   . Heart failure Mother   . Hypertension Mother   . Stroke Mother   . Diabetes Father   . Alcohol abuse Father   . Cirrhosis Father   . Diabetes  Sister   . Hypertension Sister   . Cancer Paternal Aunt        cevical  . Cancer Paternal Aunt        cervical  . Drug abuse Brother   . Heart attack Brother 27  . Asthma Daughter   . Depression Daughter   . Anxiety disorder Daughter   . Asthma Daughter     Social History Social History   Tobacco Use  . Smoking status: Never Smoker  . Smokeless tobacco: Never Used  Substance Use Topics  . Alcohol use: No  . Drug use: No     Allergies   Ace inhibitors; Angiotensin receptor blockers; and Latex   Review of Systems Review of Systems  Constitutional: Positive for activity change.  Respiratory: Negative for shortness of breath.   Cardiovascular: Negative for chest pain.  Gastrointestinal: Positive for abdominal pain. Negative for nausea and vomiting.  Genitourinary: Positive for frequency. Negative for dysuria, flank pain, hematuria, vaginal bleeding and vaginal discharge.  All other systems reviewed and are negative.    Physical Exam Updated Vital Signs BP 131/82 (BP Location: Right Arm)   Pulse 65   Temp 98.3 F (36.8 C) (Oral)   Resp 18   Ht 5\' 1"  (1.549 m)   Wt 91.2 kg (201 lb)   SpO2 100%   BMI 37.98 kg/m   Physical Exam  Constitutional: She is oriented to person, place, and time. She appears well-developed.  HENT:  Head: Normocephalic and atraumatic.  Eyes: EOM are normal.  Neck: Normal range of motion. Neck supple.  Cardiovascular: Normal rate.  Pulmonary/Chest: Effort normal.  Abdominal: Bowel sounds are normal. There is  tenderness.  Patient has generalized abdominal tenderness over the right side in the epigastric region.  Patient has guarding in the right upper and right lower quadrant  Neurological: She is alert and oriented to person, place, and time.  Skin: Skin is warm and dry.  Nursing note and vitals reviewed.    ED Treatments / Results  Labs (all labs ordered are listed, but only abnormal results are displayed) Labs Reviewed  URINALYSIS, ROUTINE W REFLEX MICROSCOPIC - Abnormal; Notable for the following components:      Result Value   APPearance CLOUDY (*)    Hgb urine dipstick MODERATE (*)    Protein, ur 30 (*)    Leukocytes, UA LARGE (*)    All other components within normal limits  URINALYSIS, MICROSCOPIC (REFLEX) - Abnormal; Notable for the following components:   Bacteria, UA MANY (*)    All other components within normal limits  CBC WITH DIFFERENTIAL/PLATELET - Abnormal; Notable for the following components:   Hemoglobin 15.5 (*)    All other components within normal limits  COMPREHENSIVE METABOLIC PANEL  LIPASE, BLOOD    EKG None  Radiology Ct Abdomen Pelvis W Contrast  Result Date: 06/24/2018 CLINICAL DATA:  57 year old female with right lower quadrant abdominal pain. EXAM: CT ABDOMEN AND PELVIS WITH CONTRAST TECHNIQUE: Multidetector CT imaging of the abdomen and pelvis was performed using the standard protocol following bolus administration of intravenous contrast. CONTRAST:  169mL ISOVUE-300 IOPAMIDOL (ISOVUE-300) INJECTION 61% COMPARISON:  CT of the abdomen pelvis dated 10/03/2013 FINDINGS: Lower chest: The visualized lung bases are clear. There is mild to moderate cardiomegaly with interval increase compared to the study of 2014. No intra-abdominal free air or free fluid. Hepatobiliary: Cholecystectomy. Probable mild fatty liver. No intrahepatic biliary ductal dilatation. There is mild dilatation of the CBD, post cholecystectomy. No calcified  stone noted in the central CBD.  There is interposition of the cecum anterior to the liver with associated mass effect and indentation of the anterior surface of the liver. Pancreas: Unremarkable. No pancreatic ductal dilatation or surrounding inflammatory changes. Spleen: Normal in size without focal abnormality. Adrenals/Urinary Tract: There is a 2 cm indeterminate left adrenal nodule similar to prior CT of 2014 most consistent with an adenoma. The right adrenal gland is unremarkable. There is a 3 mm stone in the distal right ureter close to the ureteral vesicle junction. There is associated mild right hydronephrosis. Mild right perinephric stranding may be related to hydronephrosis. Correlation with urinalysis recommended to exclude superimposed UTI. The left kidney, left ureter, and urinary bladder appear unremarkable. Stomach/Bowel: Postsurgical changes of the bariatric surgery. There is no bowel obstruction or active inflammation. Small scattered sigmoid diverticula without active inflammatory changes. The cecum is located in the right upper quadrant under the right hemidiaphragm and anterior to the liver. The appendix is unremarkable. A 2 mm focus of calcification or stone noted at the tip of the appendix. Vascular/Lymphatic: The abdominal aorta and IVC appear unremarkable. No portal venous gas. There is no adenopathy. Reproductive: The uterus is anteverted otherwise unremarkable. The ovaries are grossly unremarkable. No pelvic mass. Other: None Musculoskeletal: Lower lumbar facet hypertrophy. No acute osseous pathology. IMPRESSION: 1. A 3 mm distal right ureteral/right UVJ stone with mild right hydronephrosis. Correlation with urinalysis recommended to exclude UTI. 2. Mobile cecum located in the right upper quadrant anterior to the liver. No evidence of bowel obstruction or active inflammation. Normal appendix. 3. Scattered colonic diverticula without active inflammatory changes. Electronically Signed   By: Anner Crete M.D.   On:  06/24/2018 06:52    Procedures Procedures (including critical care time)  Medications Ordered in ED Medications  cefTRIAXone (ROCEPHIN) 1 g in sodium chloride 0.9 % 100 mL IVPB (has no administration in time range)  sodium chloride 0.9 % bolus 1,000 mL (1,000 mLs Intravenous New Bag/Given 06/24/18 0532)  morphine 4 MG/ML injection 4 mg (4 mg Intravenous Given 06/24/18 0532)  iopamidol (ISOVUE-300) 61 % injection 100 mL (100 mLs Intravenous Contrast Given 06/24/18 7619)     Initial Impression / Assessment and Plan / ED Course  I have reviewed the triage vital signs and the nursing notes.  Pertinent labs & imaging results that were available during my care of the patient were reviewed by me and considered in my medical decision making (see chart for details).  Clinical Course as of Jun 24 709  Thu Jun 24, 2018  0709 Results from the ER workup discussed with the patient face to face and all questions answered to the best of my ability.  Patient CT scan shows that she has a right-sided kidney stone.  Based on history and the urine analysis I do think patient is also having a UTI.  We will give her Rocephin here.  Patient is not septic, we will discharge her with strict ER return precautions and follow-up with urology.  CT ABDOMEN PELVIS W CONTRAST [AN]    Clinical Course User Index [AN] Varney Biles, MD    57 year old female with history of gastric sleeve, cholecystectomy comes in with chief complaint of abdominal pain, urinary frequency and headaches.  Patient's headaches are mild and there is no associated neurologic symptoms or deficits.  The headaches likely are due to stress.  Patient is also having urinary frequency with some suprapubic pressure, likely the symptoms are due to a UTI.  Finally patient has abdominal discomfort that is generalized and includes right lower and right upper quadrant.  Symptoms unlikely to be due to UTI, CT scan ordered given she has history of  multiple abdominal surgeries including gastric sleeve.  Differential diagnosis includes small bowel obstruction, appendicitis.  Final Clinical Impressions(s) / ED Diagnoses   Final diagnoses:  Ureteral colic  Acute cystitis without hematuria    ED Discharge Orders    None       Varney Biles, MD 06/24/18 0710

## 2018-06-24 NOTE — ED Triage Notes (Signed)
Pt c/o frequency urination with pressure on her right lower abd for the past 3 days getting worse today with HA.

## 2018-06-25 ENCOUNTER — Ambulatory Visit: Payer: Medicare Other | Admitting: Obstetrics & Gynecology

## 2018-06-28 DIAGNOSIS — N201 Calculus of ureter: Secondary | ICD-10-CM | POA: Diagnosis not present

## 2018-07-12 DIAGNOSIS — N201 Calculus of ureter: Secondary | ICD-10-CM | POA: Diagnosis not present

## 2018-07-15 ENCOUNTER — Other Ambulatory Visit: Payer: Self-pay | Admitting: Urology

## 2018-07-22 ENCOUNTER — Encounter (HOSPITAL_BASED_OUTPATIENT_CLINIC_OR_DEPARTMENT_OTHER): Payer: Self-pay | Admitting: *Deleted

## 2018-07-22 ENCOUNTER — Other Ambulatory Visit: Payer: Self-pay

## 2018-07-22 NOTE — Progress Notes (Addendum)
Spoke w/ pt via phone for pre-op interview.  Npo after mn. Arrive at [facility]  Needs hg.  Will take prilosec and cymbalta am dos w/ sips of water.  May take oxycodone/ zofran if needed am dos.

## 2018-07-26 NOTE — H&P (Signed)
Urology Preoperative H&P   Chief Complaint: Right flank pain  History of Present Illness: Nitzia L Penny is a 57 y.o. female with a 2 month history of non-radiating right lower quadrant pain associated with urgency and frequency.  She had a CTSS on 06/24/18 that demonstrated a 3 mm right UVJ stone with mild hydronephrosis.  Follow-up imaging via KUB in the office also shows a persistent calcification in the location of the right UVJ.  She continue to have intermittent episodes of right flank pain and LUTS during her trial of passage.  She is here today for definitve stone treatment.     Past Medical History:  Diagnosis Date  . Allergic rhinitis   . History of anal fissures   . History of cervical dysplasia   . History of hypertension    no issue since wt loss after gastric sleeve  . HSV (herpes simplex virus) infection   . Hypertension   . Insomnia   . Iron deficiency anemia   . Lumbar spondylolysis   . MDD (major depressive disorder)   . Migraines   . Mild intermittent asthma   . OA (osteoarthritis)    feet, fingers, knees  . OSA on CPAP    per pt only uses occasionlly  . Postgastrectomy malabsorption   . Right ureteral stone   . 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 Wears contact lenses   . Wears partial dentures    lower    Past Surgical History:  Procedure Laterality Date  . ANTERIOR CERVICAL DECOMP/DISCECTOMY FUSION  04/21/2000   C4 -- 5  . CARDIOVASCULAR STRESS TEST  07/06/2014   Low risk nuclear study w/ no ischemia/  normal LV function and wall motion , ef 68%  . CESAREAN SECTION  x3  last one 1992   BILATERAL TUBAL LIGATION WITH LAST ONE  . ENDOMETRIAL ABLATION  12-24-2011   dr Harolyn Rutherford  [facility]   Hysteroscopy, NovaSure Endometrial Ablation, Rollerball Ablation  . KNEE ARTHROSCOPY  09/2012  . LAPAROSCOPIC CHOLECYSTECTOMY  1992  . LAPAROSCOPIC GASTRIC SLEEVE RESECTION  02-09-2017    [facility] in [place]  . TRANSTHORACIC ECHOCARDIOGRAM   04/06/2007   ef 55-65%/  no mvp     Allergies:  Allergies  Allergen Reactions  . Lisinopril Swelling    Lips swell   . Latex Itching and Rash    Family History  Problem Relation Age of Onset  . Diabetes Mother   . Heart failure Mother   . Hypertension Mother   . Stroke Mother   . Diabetes Father   . Alcohol abuse Father   . Cirrhosis Father   . Diabetes Sister   . Hypertension Sister   . Cancer Paternal Aunt        cevical  . Cancer Paternal Aunt        cervical  . Drug abuse Brother   . Heart attack Brother 68  . Asthma Daughter   . Depression Daughter   . Anxiety disorder Daughter   . Asthma Daughter     Social History:  reports that she has never smoked. She has never used smokeless tobacco. She reports that she drank alcohol. She reports that she does not use drugs.  ROS: A complete review of systems was performed.  All systems are negative except for pertinent findings as noted.  Physical Exam:  Vital signs in last 24 hours:   Constitutional:  Alert and oriented, No acute distress Cardiovascular: Regular rate and rhythm, No JVD Respiratory:  Normal respiratory effort, Lungs clear bilaterally GI: Abdomen is soft, nontender, nondistended, no abdominal masses GU: No CVA tenderness Lymphatic: No lymphadenopathy Neurologic: Grossly intact, no focal deficits Psychiatric: Normal mood and affect  Laboratory Data:  No results for input(s): WBC, HGB, HCT, PLT in the last 72 hours.  No results for input(s): NA, K, CL, GLUCOSE, BUN, CALCIUM, CREATININE in the last 72 hours.  Invalid input(s): CO3   No results found for this or any previous visit (from the past 24 hour(s)). No results found for this or any previous visit (from the past 240 hour(s)).  Renal Function: No results for input(s): CREATININE in the last 168 hours. CrCl cannot be calculated (Patient's most recent lab result is older than the maximum 21 days allowed.).  Radiologic Imaging: No  results found.  I independently reviewed the above imaging studies.  Assessment and Plan [place] is a 57 y.o. female with a 3 mm right UVJ stone  The risks, benefits and alternatives of cystoscopy with RIGHT ureteroscopy, laser lithotripsy and ureteral stent placement was discussed the patient.  Risks included, but are not limited to: bleeding, urinary tract infection, ureteral injury/avulsion, ureteral stricture formation, retained stone fragments, the possibility that multiple surgeries may be required to treat the stone(s), MI, stroke, PE and the inherent risks of general anesthesia.  The patient voices understanding and wishes to proceed.       Ellison Hughs, MD 07/26/2018, 6:40 AM  [facility] Pager: [phone]

## 2018-07-27 ENCOUNTER — Encounter (HOSPITAL_BASED_OUTPATIENT_CLINIC_OR_DEPARTMENT_OTHER)
Admission: RE | Disposition: A | Discharge status: Home / Self Care | Payer: Self-pay | Source: Ambulatory Visit | Attending: Urology

## 2018-07-27 ENCOUNTER — Ambulatory Visit (HOSPITAL_BASED_OUTPATIENT_CLINIC_OR_DEPARTMENT_OTHER)
Admission: RE | Admit: 2018-07-27 | Discharge: 2018-07-27 | Disposition: A | Discharge status: Home / Self Care | Payer: Medicare Other | Source: Ambulatory Visit | Attending: Urology | Admitting: Urology

## 2018-07-27 ENCOUNTER — Ambulatory Visit (HOSPITAL_BASED_OUTPATIENT_CLINIC_OR_DEPARTMENT_OTHER): Payer: Medicare Other | Admitting: Anesthesiology

## 2018-07-27 ENCOUNTER — Encounter (HOSPITAL_BASED_OUTPATIENT_CLINIC_OR_DEPARTMENT_OTHER): Payer: Self-pay

## 2018-07-27 DIAGNOSIS — F329 Major depressive disorder, single episode, unspecified: Secondary | ICD-10-CM | POA: Insufficient documentation

## 2018-07-27 DIAGNOSIS — Z981 Arthrodesis status: Secondary | ICD-10-CM | POA: Insufficient documentation

## 2018-07-27 DIAGNOSIS — G43909 Migraine, unspecified, not intractable, without status migrainosus: Secondary | ICD-10-CM | POA: Insufficient documentation

## 2018-07-27 DIAGNOSIS — Z9049 Acquired absence of other specified parts of digestive tract: Secondary | ICD-10-CM | POA: Diagnosis not present

## 2018-07-27 DIAGNOSIS — Z888 Allergy status to other drugs, medicaments and biological substances status: Secondary | ICD-10-CM | POA: Diagnosis not present

## 2018-07-27 DIAGNOSIS — D509 Iron deficiency anemia, unspecified: Secondary | ICD-10-CM | POA: Diagnosis not present

## 2018-07-27 DIAGNOSIS — N132 Hydronephrosis with renal and ureteral calculous obstruction: Secondary | ICD-10-CM | POA: Insufficient documentation

## 2018-07-27 DIAGNOSIS — M19049 Primary osteoarthritis, unspecified hand: Secondary | ICD-10-CM | POA: Insufficient documentation

## 2018-07-27 DIAGNOSIS — Z91040 Latex allergy status: Secondary | ICD-10-CM | POA: Diagnosis not present

## 2018-07-27 DIAGNOSIS — M170 Bilateral primary osteoarthritis of knee: Secondary | ICD-10-CM | POA: Diagnosis not present

## 2018-07-27 DIAGNOSIS — N201 Calculus of ureter: Secondary | ICD-10-CM | POA: Diagnosis not present

## 2018-07-27 DIAGNOSIS — G4733 Obstructive sleep apnea (adult) (pediatric): Secondary | ICD-10-CM | POA: Insufficient documentation

## 2018-07-27 DIAGNOSIS — G4700 Insomnia, unspecified: Secondary | ICD-10-CM | POA: Insufficient documentation

## 2018-07-27 DIAGNOSIS — M19072 Primary osteoarthritis, left ankle and foot: Secondary | ICD-10-CM | POA: Insufficient documentation

## 2018-07-27 DIAGNOSIS — Z8249 Family history of ischemic heart disease and other diseases of the circulatory system: Secondary | ICD-10-CM | POA: Diagnosis not present

## 2018-07-27 DIAGNOSIS — Z8049 Family history of malignant neoplasm of other genital organs: Secondary | ICD-10-CM | POA: Insufficient documentation

## 2018-07-27 DIAGNOSIS — I10 Essential (primary) hypertension: Secondary | ICD-10-CM | POA: Insufficient documentation

## 2018-07-27 DIAGNOSIS — Z825 Family history of asthma and other chronic lower respiratory diseases: Secondary | ICD-10-CM | POA: Insufficient documentation

## 2018-07-27 DIAGNOSIS — M19071 Primary osteoarthritis, right ankle and foot: Secondary | ICD-10-CM | POA: Insufficient documentation

## 2018-07-27 DIAGNOSIS — Z8489 Family history of other specified conditions: Secondary | ICD-10-CM | POA: Insufficient documentation

## 2018-07-27 DIAGNOSIS — Z6838 Body mass index (BMI) 38.0-38.9, adult: Secondary | ICD-10-CM | POA: Insufficient documentation

## 2018-07-27 DIAGNOSIS — Z8379 Family history of other diseases of the digestive system: Secondary | ICD-10-CM | POA: Diagnosis not present

## 2018-07-27 DIAGNOSIS — Z823 Family history of stroke: Secondary | ICD-10-CM | POA: Diagnosis not present

## 2018-07-27 DIAGNOSIS — J309 Allergic rhinitis, unspecified: Secondary | ICD-10-CM | POA: Diagnosis not present

## 2018-07-27 DIAGNOSIS — Z466 Encounter for fitting and adjustment of urinary device: Secondary | ICD-10-CM | POA: Diagnosis not present

## 2018-07-27 DIAGNOSIS — Z818 Family history of other mental and behavioral disorders: Secondary | ICD-10-CM | POA: Insufficient documentation

## 2018-07-27 DIAGNOSIS — Z833 Family history of diabetes mellitus: Secondary | ICD-10-CM | POA: Insufficient documentation

## 2018-07-27 DIAGNOSIS — J4520 Mild intermittent asthma, uncomplicated: Secondary | ICD-10-CM | POA: Insufficient documentation

## 2018-07-27 DIAGNOSIS — Z9884 Bariatric surgery status: Secondary | ICD-10-CM | POA: Diagnosis not present

## 2018-07-27 DIAGNOSIS — K219 Gastro-esophageal reflux disease without esophagitis: Secondary | ICD-10-CM | POA: Insufficient documentation

## 2018-07-27 DIAGNOSIS — Z811 Family history of alcohol abuse and dependence: Secondary | ICD-10-CM | POA: Insufficient documentation

## 2018-07-27 DIAGNOSIS — M479 Spondylosis, unspecified: Secondary | ICD-10-CM | POA: Diagnosis not present

## 2018-07-27 DIAGNOSIS — Z711 Person with feared health complaint in whom no diagnosis is made: Secondary | ICD-10-CM | POA: Diagnosis not present

## 2018-07-27 HISTORY — DX: Bariatric surgery status: Z98.84

## 2018-07-27 HISTORY — DX: Acquired absence of stomach (part of): K91.2

## 2018-07-27 HISTORY — DX: Major depressive disorder, single episode, unspecified: F32.9

## 2018-07-27 HISTORY — DX: Allergic rhinitis, unspecified: J30.9

## 2018-07-27 HISTORY — DX: Obstructive sleep apnea (adult) (pediatric): G47.33

## 2018-07-27 HISTORY — DX: Acquired absence of stomach (part of): Z90.3

## 2018-07-27 HISTORY — PX: CYSTOSCOPY/URETEROSCOPY/HOLMIUM LASER/STENT PLACEMENT: SHX6546

## 2018-07-27 HISTORY — DX: Personal history of other diseases of the circulatory system: Z86.79

## 2018-07-27 HISTORY — DX: Calculus of ureter: N20.1

## 2018-07-27 HISTORY — DX: Personal history of cervical dysplasia: Z87.410

## 2018-07-27 HISTORY — DX: Presence of spectacles and contact lenses: Z97.3

## 2018-07-27 HISTORY — DX: Personal history of other diseases of the digestive system: Z87.19

## 2018-07-27 HISTORY — DX: Dependence on other enabling machines and devices: Z99.89

## 2018-07-27 HISTORY — DX: Iron deficiency anemia, unspecified: D50.9

## 2018-07-27 HISTORY — DX: Presence of dental prosthetic device (complete) (partial): Z97.2

## 2018-07-27 HISTORY — DX: Unspecified osteoarthritis, unspecified site: M19.90

## 2018-07-27 HISTORY — DX: Mild intermittent asthma, uncomplicated: J45.20

## 2018-07-27 HISTORY — DX: Spondylolysis, lumbar region: M43.06

## 2018-07-27 SURGERY — CYSTOSCOPY/URETEROSCOPY/HOLMIUM LASER/STENT PLACEMENT
Anesthesia: General | Laterality: Right

## 2018-07-27 MED ORDER — LIDOCAINE 2% (20 MG/ML) 5 ML SYRINGE
INTRAMUSCULAR | Status: DC | PRN
  Administered 2018-07-27: 60 mg via INTRAVENOUS

## 2018-07-27 MED ORDER — PHENAZOPYRIDINE HCL 200 MG PO TABS: 200.0000 mg | ORAL_TABLET | Freq: Three times a day (TID) | ORAL | 0 refills | Status: DC | PRN

## 2018-07-27 MED ORDER — ONDANSETRON HCL 4 MG PO TABS: 4.0000 mg | ORAL_TABLET | Freq: Every day | ORAL | 1 refills | Status: DC | PRN

## 2018-07-27 MED ORDER — MIDAZOLAM HCL 2 MG/2ML IJ SOLN
INTRAMUSCULAR | Status: DC | PRN
  Administered 2018-07-27: 1 mg via INTRAVENOUS

## 2018-07-27 MED ORDER — ONDANSETRON HCL 4 MG/2ML IJ SOLN
INTRAMUSCULAR | Status: DC | PRN
  Administered 2018-07-27: 4 mg via INTRAVENOUS

## 2018-07-27 MED ORDER — MIDAZOLAM HCL 2 MG/2ML IJ SOLN
INTRAMUSCULAR | Status: AC
  Filled 2018-07-27: qty 2

## 2018-07-27 MED ORDER — KETOROLAC TROMETHAMINE 30 MG/ML IJ SOLN
INTRAMUSCULAR | Status: AC
  Filled 2018-07-27: qty 1

## 2018-07-27 MED ORDER — OXYBUTYNIN CHLORIDE 5 MG PO TABS: 5.0000 mg | ORAL_TABLET | Freq: Three times a day (TID) | ORAL | 1 refills | Status: DC | PRN

## 2018-07-27 MED ORDER — SULFAMETHOXAZOLE-TRIMETHOPRIM 800-160 MG PO TABS: 1.0000 | ORAL_TABLET | Freq: Two times a day (BID) | ORAL | 0 refills | Status: AC

## 2018-07-27 MED ORDER — PROMETHAZINE HCL 25 MG/ML IJ SOLN
6.2500 mg | INTRAMUSCULAR | Status: DC | PRN
  Filled 2018-07-27: qty 1

## 2018-07-27 MED ORDER — PROPOFOL 10 MG/ML IV BOLUS
INTRAVENOUS | Status: AC
  Filled 2018-07-27: qty 20

## 2018-07-27 MED ORDER — FENTANYL CITRATE (PF) 100 MCG/2ML IJ SOLN
INTRAMUSCULAR | Status: AC
  Filled 2018-07-27: qty 2

## 2018-07-27 MED ORDER — PROPOFOL 10 MG/ML IV BOLUS
INTRAVENOUS | Status: DC | PRN
  Administered 2018-07-27: 140 mg via INTRAVENOUS

## 2018-07-27 MED ORDER — KETOROLAC TROMETHAMINE 30 MG/ML IJ SOLN
30.0000 mg | Freq: Once | INTRAMUSCULAR | Status: AC | PRN
  Administered 2018-07-27: 30 mg via INTRAVENOUS
  Filled 2018-07-27: qty 1

## 2018-07-27 MED ORDER — FENTANYL CITRATE (PF) 100 MCG/2ML IJ SOLN
25.0000 ug | INTRAMUSCULAR | Status: DC | PRN
  Filled 2018-07-27: qty 1

## 2018-07-27 MED ORDER — LACTATED RINGERS IV SOLN
INTRAVENOUS | Status: DC
  Administered 2018-07-27: 09:00:00 via INTRAVENOUS
  Filled 2018-07-27: qty 1000

## 2018-07-27 MED ORDER — CEFAZOLIN SODIUM-DEXTROSE 2-4 GM/100ML-% IV SOLN
INTRAVENOUS | Status: AC
  Filled 2018-07-27: qty 100

## 2018-07-27 MED ORDER — CEFAZOLIN SODIUM-DEXTROSE 2-4 GM/100ML-% IV SOLN
2.0000 g | Freq: Once | INTRAVENOUS | Status: AC
  Administered 2018-07-27: 2 g via INTRAVENOUS
  Filled 2018-07-27: qty 100

## 2018-07-27 MED ORDER — SODIUM CHLORIDE 0.9 % IR SOLN
Status: DC | PRN
  Administered 2018-07-27: 3000 mL via INTRAVESICAL

## 2018-07-27 MED ORDER — DEXAMETHASONE SODIUM PHOSPHATE 10 MG/ML IJ SOLN
INTRAMUSCULAR | Status: DC | PRN
  Administered 2018-07-27: 10 mg via INTRAVENOUS

## 2018-07-27 MED ORDER — ONDANSETRON HCL 4 MG/2ML IJ SOLN
INTRAMUSCULAR | Status: AC
  Filled 2018-07-27: qty 2

## 2018-07-27 MED ORDER — FENTANYL CITRATE (PF) 100 MCG/2ML IJ SOLN
INTRAMUSCULAR | Status: DC | PRN
  Administered 2018-07-27: 50 ug via INTRAVENOUS

## 2018-07-27 MED ORDER — IOHEXOL 300 MG/ML  SOLN
INTRAMUSCULAR | Status: DC | PRN
  Administered 2018-07-27: 10 mL via URETHRAL

## 2018-07-27 MED ORDER — PHENYLEPHRINE 40 MCG/ML (10ML) SYRINGE FOR IV PUSH (FOR BLOOD PRESSURE SUPPORT)
PREFILLED_SYRINGE | INTRAVENOUS | Status: AC
  Filled 2018-07-27: qty 10

## 2018-07-27 SURGICAL SUPPLY — 30 items
APL SKNCLS STERI-STRIP NONHPOA (GAUZE/BANDAGES/DRESSINGS)
BAG DRAIN URO-CYSTO SKYTR STRL (DRAIN) ×3 IMPLANT
BAG DRN UROCATH (DRAIN) ×1
BASKET STONE 1.7 NGAGE (UROLOGICAL SUPPLIES) IMPLANT
BASKET ZERO TIP NITINOL 2.4FR (BASKET) ×3 IMPLANT
BENZOIN TINCTURE PRP APPL 2/3 (GAUZE/BANDAGES/DRESSINGS) IMPLANT
BSKT STON RTRVL ZERO TP 2.4FR (BASKET) ×1
CATH URET 5FR 28IN OPEN ENDED (CATHETERS) IMPLANT
CLOSURE WOUND 1/2 X4 (GAUZE/BANDAGES/DRESSINGS)
CLOTH BEACON ORANGE TIMEOUT ST (SAFETY) ×3 IMPLANT
FIBER LASER FLEXIVA 365 (UROLOGICAL SUPPLIES) IMPLANT
FIBER LASER TRAC TIP (UROLOGICAL SUPPLIES) IMPLANT
GLOVE BIO SURGEON STRL SZ7.5 (GLOVE) ×3 IMPLANT
GOWN STRL REUS W/TWL XL LVL3 (GOWN DISPOSABLE) ×3 IMPLANT
GUIDEWIRE STR DUAL SENSOR (WIRE) IMPLANT
GUIDEWIRE ZIPWRE .038 STRAIGHT (WIRE) ×3 IMPLANT
INFUSOR MANOMETER BAG 3000ML (MISCELLANEOUS) ×3 IMPLANT
IV NS 1000ML (IV SOLUTION)
IV NS 1000ML BAXH (IV SOLUTION) IMPLANT
IV NS IRRIG 3000ML ARTHROMATIC (IV SOLUTION) ×3 IMPLANT
KIT TURNOVER CYSTO (KITS) ×3 IMPLANT
MANIFOLD NEPTUNE II (INSTRUMENTS) ×3 IMPLANT
NS IRRIG 500ML POUR BTL (IV SOLUTION) ×6 IMPLANT
PACK CYSTO (CUSTOM PROCEDURE TRAY) ×3 IMPLANT
STENT URET 6FRX24 CONTOUR (STENTS) ×2 IMPLANT
STRIP CLOSURE SKIN 1/2X4 (GAUZE/BANDAGES/DRESSINGS) IMPLANT
SYR 10ML LL (SYRINGE) ×3 IMPLANT
TUBE CONNECTING 12'X1/4 (SUCTIONS)
TUBE CONNECTING 12X1/4 (SUCTIONS) IMPLANT
TUBING UROLOGY SET (TUBING) IMPLANT

## 2018-07-27 NOTE — Anesthesia Procedure Notes (Signed)
Procedure Name: LMA Insertion Date/Time: 07/27/2018 9:17 AM Performed by: Cynda Familia, CRNA Pre-anesthesia Checklist: Patient identified, Emergency Drugs available, Suction available and Patient being monitored Patient Re-evaluated:Patient Re-evaluated prior to induction Oxygen Delivery Method: Circle System Utilized Preoxygenation: Pre-oxygenation with 100% oxygen Induction Type: IV induction Ventilation: Mask ventilation without difficulty LMA: LMA inserted LMA Size: 4.0 Number of attempts: 1 Placement Confirmation: positive ETCO2 Tube secured with: Tape Dental Injury: Teeth and Oropharynx as per pre-operative assessment  Comments: Smooth IV induction Rose--- LMA AM CRNA ataruamtic--- teeth and mouth as preop-- bilat BS Rose

## 2018-07-27 NOTE — Transfer of Care (Signed)
Immediate Anesthesia Transfer of Care Note  Patient: Jessica [name]  Procedure(s) Performed: CYSTOSCOPY/RETROGRADE/URETEROSCOPY LASER/STENT PLACEMENT (Right )  Patient Location: PACU  Anesthesia Type:General  Level of Consciousness: awake and alert   Airway & Oxygen Therapy: Patient Spontanous Breathing and Patient connected to face mask oxygen  Post-op Assessment: Report given to RN and Post -op Vital signs reviewed and stable  Post vital signs: Reviewed and stable  Last Vitals:  Vitals Value Taken Time  BP 154/89 07/27/2018 10:48 AM  Temp    Pulse 52 07/27/2018 10:48 AM  Resp 15 07/27/2018 10:48 AM  SpO2 100 % 07/27/2018 10:48 AM  Vitals shown include unvalidated device data.  Last Pain:  Vitals:   07/27/18 1045  TempSrc:   PainSc: 0-No pain      Patients Stated Pain Goal: 6 (93/57/01 [id number])  Complications: No apparent anesthesia complications

## 2018-07-27 NOTE — Anesthesia Postprocedure Evaluation (Signed)
Anesthesia Post Note  Patient: Jessica [name]  Procedure(s) Performed: CYSTOSCOPY/RETROGRADE/URETEROSCOPY LASER/STENT PLACEMENT (Right )     Patient location during evaluation: PACU Anesthesia Type: General Level of consciousness: awake and alert Pain management: pain level controlled Vital Signs Assessment: post-procedure vital signs reviewed and stable Respiratory status: spontaneous breathing, nonlabored ventilation and respiratory function stable Cardiovascular status: blood pressure returned to baseline and stable Postop Assessment: no apparent nausea or vomiting Anesthetic complications: no    Last Vitals:  Vitals:   07/27/18 1000 07/27/18 1015  BP: 136/85 (!) 141/84  Pulse: (!) 53 (!) 50  Resp: 13 14  Temp:    SpO2: 97% 98%    Last Pain:  Vitals:   07/27/18 1015  TempSrc:   PainSc: 0-No pain                 Audry Pili

## 2018-07-27 NOTE — Op Note (Signed)
Operative Note  Preoperative diagnosis:  1.  3 mm right UVJ calculus  Postoperative diagnosis: 1.  Passed right ureteral stone  Procedure(s): 1.  Cystoscopy 2.  Right retrograde pyelogram with intraoperative interpretation of fluoroscopic imaging 3.  Right ureteroscopy 4.  Right JJ stent placement with tether  Surgeon: Ellison Hughs, MD  Assistants: None  Anesthesia: General  Complications: None  EBL: Less than 5 mL  Specimens: 1.  None  Drains/Catheters: 1.  6 French by 24 cm JJ stent with tether  Intraoperative findings:   1. Solitary right collecting system with no filling defects or dilation involving the right ureter or right renal pelvis seen on retrograde pyelogram 2. No calculus was seen within the right ureter nor within the right renal pelvis and its associated calyces  Indication:  Jessica [name] is a 57 y.o. female with ongoing right-sided flank pain since June 24, 2018.  She had a CT scan at that time that revealed a 3 mm right UVJ stone with mild hydronephrosis.  She was placed on medical expulsive therapy, but was unable to pass her stone.  Follow-up KUB in the office showed a possible calcification within the expected course of the right ureter.  She has been consented for the above procedures, voices understanding and wishes to proceed.  Description of procedure:  After informed consent was obtained, the patient was brought to the operating room and general LMA anesthesia was administered. The patient was then placed in the dorsolithotomy position and prepped and draped in usual sterile fashion. A timeout was performed. A 23 French rigid cystoscope was then inserted into the urethral meatus and advanced into the bladder under direct vision. A complete bladder survey revealed no intravesical pathology.  A 5 French open-ended catheter was then inserted into the right ureteral orifice and a right retrograde pyelogram was obtained, with the findings listed  above.  A Glidewire was then advanced through the lumen of the ureteral catheter and up to the right renal pelvis, under fluoroscopic guidance.  The rigid cystoscope was then exchanged for a semirigid ureteroscope, which was carefully advanced into the right ureteral orifice and up to the level of the right iliac vessels, identifying no evidence of a ureteral stone.  The semirigid ureteroscope was then exchanged for a flexible ureteroscope, which was advanced over the wire and into position within the right renal pelvis.  Full inspection of the right renal pelvis and all its associated calyces, again, revealed no evidence of a urinary calcification.  The flexible ureteroscope was then carefully retracted back down the ureter and complete inspection revealed no evidence of a ureteral calculus.  The Glidewire was replaced.  A 6 French by 24 cm JJ stent was placed over the wire and into good position within the right collecting system, confirming placement via fluoroscopy.  The tether of the stent was left intact and placed in the vaginal vault.  The patient's bladder was drained.  She tolerated the procedure well and was transferred to the postanesthesia in stable condition.  Plan: The patient has been instructed to remove her right JJ stent at 6 AM on 07/30/2018.  Follow up in 6 weeks.

## 2018-07-27 NOTE — Anesthesia Preprocedure Evaluation (Addendum)
Anesthesia Evaluation  Patient identified by MRN, date of birth, ID band Patient awake    Reviewed: Allergy & Precautions, NPO status , Patient's Chart, lab work & pertinent test results  Airway Mallampati: III  TM Distance: <3 FB Neck ROM: Full    Dental no notable dental hx. (+) Dental Advisory Given, Caps   Pulmonary asthma , sleep apnea ,    Pulmonary exam normal breath sounds clear to auscultation       Cardiovascular hypertension, Normal cardiovascular exam Rhythm:Regular Rate:Normal  untreated   Neuro/Psych negative neurological ROS  negative psych ROS   GI/Hepatic Neg liver ROS, GERD  ,  Endo/Other  Morbid obesity  Renal/GU negative Renal ROS  negative genitourinary   Musculoskeletal negative musculoskeletal ROS (+)   Abdominal   Peds negative pediatric ROS (+)  Hematology negative hematology ROS (+)   Anesthesia Other Findings   Reproductive/Obstetrics negative OB ROS                            Anesthesia Physical Anesthesia Plan  ASA: III  Anesthesia Plan: General   Post-op Pain Management:    Induction: Intravenous  PONV Risk Score and Plan: 3 and Ondansetron, Dexamethasone and Treatment may vary due to age or medical condition  Airway Management Planned: LMA  Additional Equipment:   Intra-op Plan:   Post-operative Plan: Extubation in OR  Informed Consent: I have reviewed the patients History and Physical, chart, labs and discussed the procedure including the risks, benefits and alternatives for the proposed anesthesia with the patient or authorized representative who has indicated his/her understanding and acceptance.   Dental advisory given  Plan Discussed with: CRNA and Surgeon  Anesthesia Plan Comments:         Anesthesia Quick Evaluation

## 2018-07-27 NOTE — Anesthesia Procedure Notes (Signed)
Date/Time: 07/27/2018 9:42 AM Performed by: Cynda Familia, CRNA Oxygen Delivery Method: Simple face mask Placement Confirmation: positive ETCO2 and breath sounds checked- equal and bilateral Dental Injury: Teeth and Oropharynx as per pre-operative assessment

## 2018-07-27 NOTE — Discharge Instructions (Signed)

## 2018-07-28 ENCOUNTER — Encounter (HOSPITAL_BASED_OUTPATIENT_CLINIC_OR_DEPARTMENT_OTHER): Payer: Self-pay | Admitting: Urology

## 2018-07-30 LAB — POCT HEMOGLOBIN-HEMACUE: Hemoglobin: 12.8 g/dL (ref 12.0–15.0)

## 2018-08-20 ENCOUNTER — Other Ambulatory Visit: Payer: Self-pay

## 2018-08-20 DIAGNOSIS — B0089 Other herpesviral infection: Secondary | ICD-10-CM

## 2018-08-20 MED ORDER — ACYCLOVIR 5 % EX OINT: 1.0000 "application " | TOPICAL_OINTMENT | CUTANEOUS | 1 refills | Status: AC

## 2018-09-07 DIAGNOSIS — N200 Calculus of kidney: Secondary | ICD-10-CM | POA: Diagnosis not present

## 2018-09-14 ENCOUNTER — Other Ambulatory Visit: Payer: Self-pay | Admitting: *Deleted

## 2018-09-14 DIAGNOSIS — G4700 Insomnia, unspecified: Secondary | ICD-10-CM

## 2018-09-16 ENCOUNTER — Other Ambulatory Visit: Payer: Self-pay

## 2018-09-16 MED ORDER — ALBUTEROL SULFATE HFA 108 (90 BASE) MCG/ACT IN AERS: INHALATION_SPRAY | RESPIRATORY_TRACT | 1 refills | Status: AC

## 2018-09-16 NOTE — Telephone Encounter (Signed)
2nd refill request received. Danley Danker, RN [facility] RN)

## 2018-09-23 ENCOUNTER — Other Ambulatory Visit: Payer: Self-pay | Admitting: Family Medicine

## 2018-09-23 DIAGNOSIS — Z1231 Encounter for screening mammogram for malignant neoplasm of breast: Secondary | ICD-10-CM

## 2018-10-26 ENCOUNTER — Ambulatory Visit
Admission: RE | Admit: 2018-10-26 | Discharge: 2018-10-26 | Disposition: A | Discharge status: Home / Self Care | Payer: Medicare Other | Source: Ambulatory Visit | Attending: Family Medicine | Admitting: Family Medicine

## 2018-10-26 DIAGNOSIS — Z1231 Encounter for screening mammogram for malignant neoplasm of breast: Secondary | ICD-10-CM

## 2018-11-10 ENCOUNTER — Other Ambulatory Visit: Payer: Self-pay

## 2018-11-10 DIAGNOSIS — G4700 Insomnia, unspecified: Secondary | ICD-10-CM

## 2018-11-19 ENCOUNTER — Ambulatory Visit (INDEPENDENT_AMBULATORY_CARE_PROVIDER_SITE_OTHER): Payer: Medicare Other | Admitting: Family Medicine

## 2018-11-19 ENCOUNTER — Encounter: Payer: Self-pay | Admitting: Family Medicine

## 2018-11-19 ENCOUNTER — Other Ambulatory Visit: Payer: Self-pay

## 2018-11-19 VITALS — BP 110/68 | HR 76 | Temp 98.0°F | Ht 61.0 in | Wt 214.2 lb

## 2018-11-19 DIAGNOSIS — F419 Anxiety disorder, unspecified: Secondary | ICD-10-CM

## 2018-11-19 DIAGNOSIS — R7309 Other abnormal glucose: Secondary | ICD-10-CM

## 2018-11-19 DIAGNOSIS — K5900 Constipation, unspecified: Secondary | ICD-10-CM | POA: Diagnosis not present

## 2018-11-19 DIAGNOSIS — M25512 Pain in left shoulder: Secondary | ICD-10-CM

## 2018-11-19 DIAGNOSIS — B0089 Other herpesviral infection: Secondary | ICD-10-CM

## 2018-11-19 LAB — POCT GLYCOSYLATED HEMOGLOBIN (HGB A1C): HEMOGLOBIN A1C: 5.2 % (ref 4.0–5.6)

## 2018-11-19 MED ORDER — POLYETHYLENE GLYCOL 3350 17 GM/SCOOP PO POWD: 17.0000 g | Freq: Every day | ORAL | 1 refills | Status: DC | PRN

## 2018-11-19 MED ORDER — DULOXETINE HCL 60 MG PO CPEP
60.0000 mg | ORAL_CAPSULE | Freq: Every morning | ORAL | Status: DC
Start: 2018-11-19 — End: 2019-05-18

## 2018-11-19 MED ORDER — METHYLPREDNISOLONE ACETATE 80 MG/ML IJ SUSP
80.0000 mg | Freq: Once | INTRAMUSCULAR | Status: AC
  Administered 2018-11-19: 80 mg via INTRAMUSCULAR

## 2018-11-19 MED ORDER — VALACYCLOVIR HCL 1 G PO TABS: 2000.0000 mg | ORAL_TABLET | Freq: Two times a day (BID) | ORAL | 0 refills | Status: AC | PRN

## 2018-11-19 NOTE — Patient Instructions (Signed)
It was a pleasure to see you today! Thank you for choosing [facility] for your primary care. Jessica [name] was seen for shoulder pain and weight gain. Come back to the clinic if you are getting the pain back in a few months, and go to the emergency room if you have any life threatening symptoms.  Today we sent in a referral to nutrition, refilled your meds and gave you a steroid injection in your left shoulder.   Please bring all your medications to every doctors visit   Sign up for My Chart to have easy access to your labs results, and communication with your Primary care physician.     Please check-out at the front desk before leaving the clinic.     Best,  Dr. Sherene Sires FAMILY MEDICINE RESIDENT - PGY2 11/19/2018 10:09 AM

## 2018-11-19 NOTE — Progress Notes (Signed)
Subjective:  Jessica [name] is a 57 y.o. female who presents to the [facility] today with a chief complaint of left shoulder pain.   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  Did have a slip and fall in shower roughly 2 weeks ago and pain shoulder has been worse since.  She says she has had good response to steroid injections in the past and request another one.  She denies any wounds or significant bruising or numbness distal to the shoulder.  Feels her range of motion is roughly normal for her.  Other abnormal glucose History of abnormal glucose and obesity.  Patient denies changes in dietary habits.  Patient states that she is concerned about her weight and its contribution to her chronic pain.  She says that she is unable to do significant physical exercise due to arthritis  Constipation Patient is ran out of her MiraLAX.  She says that she has normal bowel movements and has been stable when she is on but she would like a refill  Anxiety Anxiety is stable and managed well per patient on her chronic duloxetine.  She would like refill.  She feels she is functional and denies any SI/HI  Hx Herpes simplex virus type 1 (HSV-1) dermatitis No current lesions but patient has ran out of her running Valtrex prescription.  Would like to get another prescription filled in case she has new outbreak   Objective:  Physical Exam: BP 110/68   Pulse 76   Temp 98 F (36.7 C) (Oral)   Ht 5\' 1"  (1.549 m)   Wt 214 lb 3.2 oz (97.2 kg)   LMP 02/11/2012   SpO2 98%   BMI 40.47 kg/m   Gen: NAD, resting comfortably, obese CV: RRR with no murmurs appreciated Pulm: NWOB, CTAB with no crackles, wheezes, or rhonchi GI: Normal bowel sounds present. Soft, Nontender, Nondistended. MSK: no edema, cyanosis, or clubbing noted.  No bony abnormality of left shoulder, no bruising or distal deficit.  She does have expressed pain with abduction  both passive at 90deg and active past 45. Skin: warm, dry Neuro: grossly normal, moves all extremities Psych: Normal affect and thought content  No results found for this or any previous visit (from the past 72 hour(s)).   Assessment/Plan:  Other abnormal glucose History of abnormal glucose and obesity.  A1c checked 5.2 no medicinal intervention required  Patient referred to nutrition services  Constipation Patient is ran out of her MiraLAX.  She says that she has normal bowel movements and has been stable when she is on but she would like a refill  Anxiety Anxiety is stable and managed well per patient on her chronic duloxetine.  She would like refill.  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  Did have a slip and fall in shower roughly 2 weeks ago and pain shoulder has been worse since.  She says she has had good response to steroid injections in the past and request another one  Steroid injection performed by Dr. Andria Frames with  Dr. Criss Rosales observing  PRE-OP DIAGNOSIS: chronic arthritis in left [facility] joint aggravated by fall POST-OP DIAGNOSIS: Same  PROCEDURE: joint injection  Performing Physician: Dr. Andria Frames Dose: 80mg  depomedrol Procedure: The area was prepped in the usual sterile manner with betahexadine. The needle was inserted into  the [facility] joint using the anterior approach and the steroid was injected. There were no complications during this procedure.  Followup: The patient tolerated the procedure well without complications. Standard post-procedure care is explained and return precautions are given.   Patient was already expressing improvement in pain and range of motion   Hx Herpes simplex virus type 1 (HSV-1) dermatitis No current lesions but patient has ran out of her running Valtrex prescription.  Would like to get another prescription filled in case she has new  outbreak   Sherene Sires, [place] - PGY2 11/23/2018 8:28 AM

## 2018-11-23 ENCOUNTER — Other Ambulatory Visit: Payer: Self-pay

## 2018-11-23 DIAGNOSIS — R7309 Other abnormal glucose: Secondary | ICD-10-CM | POA: Insufficient documentation

## 2018-11-23 MED ORDER — TOPIRAMATE 50 MG PO TABS: 50.0000 mg | ORAL_TABLET | Freq: Two times a day (BID) | ORAL | 1 refills | Status: AC

## 2018-11-23 NOTE — Assessment & Plan Note (Signed)
History of abnormal glucose and obesity.  A1c checked 5.2 no medicinal intervention required  Patient referred to nutrition services

## 2018-11-23 NOTE — Assessment & Plan Note (Signed)
No current lesions but patient has ran out of her running Valtrex prescription.  Would like to get another prescription filled in case she has new outbreak

## 2018-11-23 NOTE — Assessment & Plan Note (Signed)
Anxiety is stable and managed well per patient on her chronic duloxetine.  She would like refill.  She feels she is functional and denies any SI/HI

## 2018-11-23 NOTE — Assessment & Plan Note (Signed)
Patient is ran out of her MiraLAX.  She says that she has normal bowel movements and has been stable when she is on but she would like a refill

## 2018-11-23 NOTE — Assessment & Plan Note (Addendum)
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  Did have a slip and fall in shower roughly 2 weeks ago and pain shoulder has been worse since.  She says she has had good response to steroid injections in the past and request another one  Steroid injection performed by Dr. Andria Frames with  Dr. Criss Rosales observing  PRE-OP DIAGNOSIS: chronic arthritis in left [facility] joint aggravated by fall POST-OP DIAGNOSIS: Same  PROCEDURE: joint injection  Performing Physician: Dr. Andria Frames Dose: 80mg  depomedrol Procedure: The area was prepped in the usual sterile manner with betahexadine. The needle was inserted into the [facility] joint using the anterior approach and the steroid was injected. There were no complications during this procedure.  Followup: The patient tolerated the procedure well without complications. Standard post-procedure care is explained and return precautions are given.   Patient was already expressing improvement in pain and range of motion

## 2019-01-26 DIAGNOSIS — H538 Other visual disturbances: Secondary | ICD-10-CM | POA: Diagnosis not present

## 2019-01-26 DIAGNOSIS — K912 Postsurgical malabsorption, not elsewhere classified: Secondary | ICD-10-CM | POA: Diagnosis not present

## 2019-01-26 DIAGNOSIS — Z903 Acquired absence of stomach [part of]: Secondary | ICD-10-CM | POA: Diagnosis not present

## 2019-01-26 DIAGNOSIS — M85851 Other specified disorders of bone density and structure, right thigh: Secondary | ICD-10-CM | POA: Diagnosis not present

## 2019-01-26 DIAGNOSIS — R7301 Impaired fasting glucose: Secondary | ICD-10-CM | POA: Diagnosis not present

## 2019-01-26 DIAGNOSIS — M25519 Pain in unspecified shoulder: Secondary | ICD-10-CM | POA: Diagnosis not present

## 2019-02-08 DIAGNOSIS — M899 Disorder of bone, unspecified: Secondary | ICD-10-CM | POA: Diagnosis not present

## 2019-02-08 DIAGNOSIS — I10 Essential (primary) hypertension: Secondary | ICD-10-CM | POA: Diagnosis not present

## 2019-02-08 DIAGNOSIS — H538 Other visual disturbances: Secondary | ICD-10-CM | POA: Diagnosis not present

## 2019-02-08 DIAGNOSIS — M170 Bilateral primary osteoarthritis of knee: Secondary | ICD-10-CM | POA: Diagnosis not present

## 2019-02-08 DIAGNOSIS — Z903 Acquired absence of stomach [part of]: Secondary | ICD-10-CM | POA: Diagnosis not present

## 2019-02-08 DIAGNOSIS — K912 Postsurgical malabsorption, not elsewhere classified: Secondary | ICD-10-CM | POA: Diagnosis not present

## 2019-03-17 DIAGNOSIS — Z87442 Personal history of urinary calculi: Secondary | ICD-10-CM | POA: Diagnosis not present

## 2019-03-17 DIAGNOSIS — R1031 Right lower quadrant pain: Secondary | ICD-10-CM | POA: Diagnosis not present

## 2019-03-24 ENCOUNTER — Ambulatory Visit: Payer: Medicare Other

## 2019-04-05 ENCOUNTER — Other Ambulatory Visit: Payer: Self-pay | Admitting: *Deleted

## 2019-04-05 MED ORDER — MONTELUKAST SODIUM 10 MG PO TABS: 10.0000 mg | ORAL_TABLET | Freq: Every day | ORAL | 5 refills | Status: DC

## 2019-04-05 MED ORDER — CETIRIZINE HCL 10 MG PO TABS: ORAL_TABLET | ORAL | 1 refills | Status: DC

## 2019-04-29 ENCOUNTER — Other Ambulatory Visit: Payer: Self-pay | Admitting: Family Medicine

## 2019-05-02 ENCOUNTER — Telehealth (INDEPENDENT_AMBULATORY_CARE_PROVIDER_SITE_OTHER): Payer: Medicare Other | Admitting: Family Medicine

## 2019-05-02 ENCOUNTER — Encounter: Payer: Self-pay | Admitting: Family Medicine

## 2019-05-02 ENCOUNTER — Other Ambulatory Visit: Payer: Self-pay

## 2019-05-02 DIAGNOSIS — G44209 Tension-type headache, unspecified, not intractable: Secondary | ICD-10-CM

## 2019-05-02 NOTE — Progress Notes (Signed)
[facility] Telemedicine Visit  Patient consented to have virtual visit. Method of visit: Video  Encounter participants: Patient: Jessica [name] - located at home Provider: Daisy Floro - located at [facility] Others (if applicable): [place]  Chief Complaint: Headaches off and on since 3 weeks, chest pain since 3 days, SOB, worried about COVID  HPI: Headache: 3 weeks, moving to [place], [place]. Has been packing by herself. Right in the front of her head, lots of pressure, no sinus pressure, eyes have been tearing up and nose run. Little sneezing, mild itchy eyes. Blurred vision over the last few months is due for another eye exam. Drinks water, Gatorade and crystal light daily (64oz+ daily), no juices or soda. She has been taking extra strength tylenol daily (takes 2-4 daily). Headaches are worse in the morning and in evening. She has been drinking decaf coffee just about every morning.   Chest pain w/ Dyspnea since 4 days: located to the right of the sternum, starts with getting up in the morning, feels better with sitting and not moving, feels worse with movement and exercise. Pain does not move, improves with nice deep breath. Brother had COVID in [place], didn't have any contact with him. Feels a little better with drinking liquids.   ROS: per HPI  Pertinent PMHx: history of Gastric sleeve,   Exam:  Respiratory: Comfortable work of breathing, no apparent distress  Assessment/Plan: Headache: tension-like in nature, patient can continue tylenol daily for headaches, not to exceed 3500mg . Can have small cup of coffee with caffeine for headache. Patient also encouraged to have eye exam as blurred vision can worsen headaches.  Chest pain: located right of sternum, sharp pain, improves with taking a deep breath, appears GI in nature, patient instructed to restart acid reflux medicine and to consume yoghurt before and after every meal to help coat  esophagus and stomach and work as natural probiotic.   Patient instructed to go to the ED should her chest pain worsen.   Time spent during visit with patient: 18:18 minutes

## 2019-05-02 NOTE — Progress Notes (Signed)
Screening questions negative.  Chrisanne Loose,CMA

## 2019-05-09 ENCOUNTER — Telehealth: Payer: Self-pay | Admitting: *Deleted

## 2019-05-09 NOTE — Telephone Encounter (Signed)
Contacted pt to see if she would like to do her AWV this Thursday and she is currently out of town and asked about doing it Friday I told her we would get her the next round of AWV schedules. Jessica [name], CMA

## 2019-05-12 DIAGNOSIS — M899 Disorder of bone, unspecified: Secondary | ICD-10-CM | POA: Diagnosis not present

## 2019-05-12 DIAGNOSIS — Z903 Acquired absence of stomach [part of]: Secondary | ICD-10-CM | POA: Diagnosis not present

## 2019-05-12 DIAGNOSIS — M170 Bilateral primary osteoarthritis of knee: Secondary | ICD-10-CM | POA: Diagnosis not present

## 2019-05-12 DIAGNOSIS — I10 Essential (primary) hypertension: Secondary | ICD-10-CM | POA: Diagnosis not present

## 2019-05-16 ENCOUNTER — Other Ambulatory Visit: Payer: Self-pay

## 2019-05-18 ENCOUNTER — Encounter: Payer: Self-pay | Admitting: Family Medicine

## 2019-05-18 ENCOUNTER — Ambulatory Visit (INDEPENDENT_AMBULATORY_CARE_PROVIDER_SITE_OTHER): Payer: Medicare Other | Admitting: Family Medicine

## 2019-05-18 ENCOUNTER — Other Ambulatory Visit: Payer: Self-pay

## 2019-05-18 VITALS — BP 110/70 | HR 76

## 2019-05-18 DIAGNOSIS — J4520 Mild intermittent asthma, uncomplicated: Secondary | ICD-10-CM | POA: Diagnosis not present

## 2019-05-18 DIAGNOSIS — B0089 Other herpesviral infection: Secondary | ICD-10-CM

## 2019-05-18 DIAGNOSIS — L245 Irritant contact dermatitis due to other chemical products: Secondary | ICD-10-CM

## 2019-05-18 DIAGNOSIS — F419 Anxiety disorder, unspecified: Secondary | ICD-10-CM | POA: Diagnosis not present

## 2019-05-18 DIAGNOSIS — Z9884 Bariatric surgery status: Secondary | ICD-10-CM

## 2019-05-18 DIAGNOSIS — K5900 Constipation, unspecified: Secondary | ICD-10-CM

## 2019-05-18 DIAGNOSIS — M23303 Other meniscus derangements, unspecified medial meniscus, right knee: Secondary | ICD-10-CM

## 2019-05-18 DIAGNOSIS — G8929 Other chronic pain: Secondary | ICD-10-CM

## 2019-05-18 DIAGNOSIS — M25512 Pain in left shoulder: Secondary | ICD-10-CM | POA: Diagnosis not present

## 2019-05-18 DIAGNOSIS — G4733 Obstructive sleep apnea (adult) (pediatric): Secondary | ICD-10-CM

## 2019-05-18 MED ORDER — DULOXETINE HCL 30 MG PO CPEP: ORAL_CAPSULE | ORAL | 0 refills | Status: DC

## 2019-05-18 MED ORDER — POLYETHYLENE GLYCOL 3350 17 GM/SCOOP PO POWD: 17.0000 g | Freq: Every day | ORAL | 1 refills | Status: AC | PRN

## 2019-05-18 MED ORDER — DULOXETINE HCL 60 MG PO CPEP: 60.0000 mg | ORAL_CAPSULE | Freq: Every day | ORAL | 0 refills | Status: DC

## 2019-05-18 MED ORDER — FLUTICASONE PROPIONATE 50 MCG/ACT NA SUSP: 2.0000 | Freq: Every day | NASAL | 2 refills | Status: DC | PRN

## 2019-05-18 MED ORDER — DICLOFENAC SODIUM 1 % TD GEL: 2.0000 g | Freq: Four times a day (QID) | TRANSDERMAL | 2 refills | Status: AC

## 2019-05-18 NOTE — Assessment & Plan Note (Addendum)
Last outbreak a month ago April.  No active lesions at this time.

## 2019-05-18 NOTE — Assessment & Plan Note (Addendum)
Previously on Cymbalta in Dec- Jan 2020, but ran out. Patient would like to restart in May 2020 prior to moving to [place] as she reports that it was helpful for her and she did not experience any side effects.  Will start patient on 30 mg for 1 week and increase to 60 mg.  Patient provided 1 extra month of refill to give patient time to find a health provider in [place].  Patient is to call with any side effects or issues.

## 2019-05-18 NOTE — Assessment & Plan Note (Signed)
Patient encouraged to find new GI provider in [place] as quickly as possible.  She will require chronic follow-up for history of gastric bypass.

## 2019-05-18 NOTE — Assessment & Plan Note (Signed)
Continues to use CPAP. °

## 2019-05-18 NOTE — Assessment & Plan Note (Signed)
Plans for right knee replacement, but waiting to get more weight off prior to surgery

## 2019-05-18 NOTE — Progress Notes (Signed)
Established Patient - Clinic Visit Subjective  Patient ID: MRN [id number]  Date of birth: August 05, 1961  PCP: Sherene Sires, DO  CC: itchy eyes & moving to [place]  HPI:  Jessica [name] is a 58 y.o. female with PMHx significant for anxiety, depression, OSA, HSV 1, primary osteoarthritis of right knee, chronic left shoulder pain, who presents to clinic for the following: #Medication refills in the setting of upcoming relocation Patient reports that she will be moving to [place] on May 30, 2019.  She is coming in today for medication refills.    #Dry lips, under eye itchiness She also complains about some facial dryness and chapped lips.  Patient reports that she was wearing medical grade facemasks for the pandemic over the last several weeks and it caused her to have eye itchiness and chapped lips.  Patient has just switched to a new facemask made of cotton.  Patient reports history of sensitive skin.  ROS: See HPI  HISTORY Meds  Allergies  PMHx: Reviewed as appropriate   Social Hx: Davene reports that she has never smoked. She has never used smokeless tobacco. She reports previous alcohol use. She reports that she does not use drugs. Social History   Social History Narrative   December 14, 2012 patient recently lost her second job. She  just got custody of her 11 year old niece. She is concerned about finances.   03/22/12: Niece is living with her.  Niece is doing better, but still struggling in school.      Current Social History 08/11/2017        Who lives at home: Patient lives alone in one level home 08/11/2017    Transportation: Patient has own vehicle  08/11/2017   Important Relationships 3 daughters and 3 grandchildren (2 boys, 1 girl) 08/11/2017    Pets: 2 yorkies: Building services engineer and [place] 08/11/2017   Education / Work:  12 th grade/ Disabled 08/11/2017   Interests / Fun: Volunteering 08/11/2017   Current Stressors: Finances 08/11/2017   Religious / Personal Beliefs: Raised [employer] 08/11/2017    Other: "Love helping people, big giver, love working with the elderly" 08/11/2017   L. Ducatte, RN, BSN                                                                                                       Objective  Physical Exam:  BP 110/70   Pulse 76   LMP 02/11/2012   SpO2 98%  General: NAD, non-toxic, well-appearing, sitting comfortably in chair   Cardiovascular: RRR, normal S1, S2. 2+ RP bilaterally. No BLEE Respiratory: CTAB. No IWOB.  Abdomen: + BS. NT, ND, soft to palpation.  Extremities: Warm and well perfused. Moving spontaneously.  Neuro: Alert & oriented x 4. CN grossly intact.    Pertinent Labs & Imaging:  Reviewed in chart as appropriate    Assessment    Anxiety Previously on Cymbalta in Dec- Jan 2020, but ran out. Patient would like to restart in May 2020 prior to moving to [place] as she reports that it was helpful for her and she did  not experience any side effects.  Will start patient on 30 mg for 1 week and increase to 60 mg.  Patient provided 1 extra month of refill to give patient time to find a health provider in [place].  Patient is to call with any side effects or issues.  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  No active lesions at this time.  SLEEP APNEA, OBSTRUCTIVE Continues to use CPAP  History of gastric bypass Patient encouraged to find new GI provider in [place] as quickly as possible.  She will require chronic follow-up for history of gastric bypass.  Irritant contact dermatitis due to other chemical products Patient encouraged to use moisturizer to dry areas and limit use of mask as much as possible.  She will try other fabrics to limit irritation.  We will not provide any prednisone at this time steroid or other medicated cream at this time as patient does not have any  active rash and physical exam is otherwise benign.   Zettie Cooley, M.D. [place]  PGY -1 05/18/2019, 10:31 AM

## 2019-05-18 NOTE — Assessment & Plan Note (Signed)
Patient encouraged to use moisturizer to dry areas and limit use of mask as much as possible.  She will try other fabrics to limit irritation.  We will not provide any prednisone at this time steroid or other medicated cream at this time as patient does not have any active rash and physical exam is otherwise benign.

## 2019-05-18 NOTE — Patient Instructions (Signed)
Dear Gus Rankin,   It was very nice to see you! Thank you for taking your time to come in to be seen. Today, we discussed the following:   Medication refills and moving  We updated your problem list/past medical history and medications   I have refilled your medications for about three months   Please take the release of information sheet with you to [place] for your new health care providers   Be well,   Dr. Zettie Cooley [facility] [phone]

## 2019-05-19 ENCOUNTER — Telehealth: Payer: Self-pay | Admitting: *Deleted

## 2019-05-19 NOTE — Telephone Encounter (Signed)
Spoke to patient on the phone today and patient reported some diarrhea yesterday and some nausea this morning.  She would like to continue take medication.  I let her know that I would be uncomfortable prescribing a new antidepressant medication knowing that I would not follow-up with her in 2 weeks.  She is understanding of this.  She would like to continue to take the medication to see if it is the true cause of the nausea and diarrhea.  She will stop taking the medication if it is causing too many side effects and seek care in detail once she is able to establish a physician.  Zettie Cooley, M.D.  Family Medicine  PGY-1 05/19/2019 6:29 PM

## 2019-05-19 NOTE — Telephone Encounter (Signed)
Pt states that the depression medication she was given yesterday is causing her to have nausea and diarrhea.  She would like to speak with MD or have a new medication sent in.  Christen Bame, CMA

## 2019-05-24 ENCOUNTER — Other Ambulatory Visit: Payer: Self-pay | Admitting: Family Medicine

## 2019-05-26 DIAGNOSIS — M899 Disorder of bone, unspecified: Secondary | ICD-10-CM | POA: Diagnosis not present

## 2019-05-26 DIAGNOSIS — Z903 Acquired absence of stomach [part of]: Secondary | ICD-10-CM | POA: Diagnosis not present

## 2019-05-26 DIAGNOSIS — K912 Postsurgical malabsorption, not elsewhere classified: Secondary | ICD-10-CM | POA: Diagnosis not present

## 2019-05-26 DIAGNOSIS — I10 Essential (primary) hypertension: Secondary | ICD-10-CM | POA: Diagnosis not present

## 2019-06-09 ENCOUNTER — Other Ambulatory Visit: Payer: Self-pay | Admitting: Family Medicine

## 2019-06-17 ENCOUNTER — Other Ambulatory Visit: Payer: Self-pay | Admitting: Family Medicine

## 2019-06-26 ENCOUNTER — Other Ambulatory Visit: Payer: Self-pay | Admitting: Family Medicine

## 2019-06-27 MED ORDER — CETIRIZINE HCL 10 MG PO TABS: 10.0000 mg | ORAL_TABLET | Freq: Every day | ORAL | 0 refills | Status: AC | PRN

## 2019-06-27 NOTE — Telephone Encounter (Signed)
Transmission failed; resent.  Christen Bame, CMA

## 2019-06-27 NOTE — Addendum Note (Signed)
Addended by: Christen Bame D on: 06/27/2019 02:05 PM   Modules accepted: Orders

## 2019-07-13 ENCOUNTER — Telehealth: Payer: Self-pay | Admitting: Family Medicine

## 2019-07-13 DIAGNOSIS — Z9884 Bariatric surgery status: Secondary | ICD-10-CM

## 2019-07-13 NOTE — Telephone Encounter (Signed)
Pt is calling and would like to know if she can have a new referral placed for bariatric doctor. She has moved and needs to be seen where she is living.   She asks that the referral be sent to Dr. Fredderick Phenix, the best contact number is [phone]. She believes the office is in [place].

## 2019-07-15 NOTE — Telephone Encounter (Signed)
Pt calling back about this referral. Pt said she has called around in [place] and all the offices have told her that her PCP here in [place] has to refer her to a bariatric office in [place]. The Dr. Fredderick Phenix she mentioned below was just one she found on [employer]. Please place the referral and call pt once this has been done. She would like this done ASAP. Please advise

## 2019-07-18 NOTE — Telephone Encounter (Signed)
Pt has been informed. Jessica [name], CMA  

## 2019-08-12 ENCOUNTER — Other Ambulatory Visit: Payer: Self-pay

## 2019-08-13 MED ORDER — FLUTICASONE PROPIONATE 50 MCG/ACT NA SUSP: 2.0000 | Freq: Every day | NASAL | 2 refills | Status: AC | PRN

## 2019-11-05 ENCOUNTER — Other Ambulatory Visit: Payer: Self-pay | Admitting: Family Medicine

## 2019-11-05 DIAGNOSIS — F341 Dysthymic disorder: Secondary | ICD-10-CM

## 2019-11-07 NOTE — Telephone Encounter (Signed)
[place]:  I received a refill request for duloxetine (last sent 90 tabs in mid June which indicates it has not been taken regularly), please call patient and schedule them for at least a telemedicine visit with one of the doctors to discuss depression medication (specifically duloxetine).  I have sent in a 15 day refill to give time for this to be accomplished.  -Dr. Criss Rosales

## 2019-11-11 NOTE — Telephone Encounter (Signed)
Contacted pt and informed her of below and she said that she has moved to [place] and has established there and wasn't sure shy it was sent to [place].  She said she was going to contact her doctor there.  She asked if she should have the appointment with [place] and I informed her she should start with her PCP there.  Informed pt that I would remove our doctor as her PCP.Junetta Hearn Zimmerman Rumple, CMA

## 2019-11-21 ENCOUNTER — Other Ambulatory Visit: Payer: Self-pay | Admitting: Family Medicine

## 2019-11-21 DIAGNOSIS — F341 Dysthymic disorder: Secondary | ICD-10-CM

## 2019-11-30 ENCOUNTER — Other Ambulatory Visit: Payer: Self-pay | Admitting: Family Medicine

## 2020-01-03 ENCOUNTER — Other Ambulatory Visit: Payer: Self-pay | Admitting: Family Medicine

## 2020-03-07 ENCOUNTER — Other Ambulatory Visit: Payer: Self-pay | Admitting: Family Medicine

## 2020-03-09 NOTE — Telephone Encounter (Signed)
As this is a respiratory medicine I will refill it this time, but patient has moved out of state and and will need to establish with a physician in her state moving forward.  This will be the last refill  Dr. Criss Rosales
